# Patient Record
Sex: Female | Born: 1970 | Race: White | Hispanic: No | Marital: Married | Smoking: Never smoker
Health system: Southern US, Community
[De-identification: ages and names within clinical notes are randomized; demographics above are authoritative.]

## PROBLEM LIST (undated history)

## (undated) DIAGNOSIS — F32A Depression, unspecified: Secondary | ICD-10-CM

## (undated) DIAGNOSIS — R519 Headache, unspecified: Secondary | ICD-10-CM

## (undated) DIAGNOSIS — Z9889 Other specified postprocedural states: Secondary | ICD-10-CM

## (undated) DIAGNOSIS — R51 Headache: Secondary | ICD-10-CM

## (undated) DIAGNOSIS — F3160 Bipolar disorder, current episode mixed, unspecified: Secondary | ICD-10-CM

## (undated) DIAGNOSIS — J45909 Unspecified asthma, uncomplicated: Secondary | ICD-10-CM

## (undated) DIAGNOSIS — S83241A Other tear of medial meniscus, current injury, right knee, initial encounter: Secondary | ICD-10-CM

## (undated) DIAGNOSIS — R112 Nausea with vomiting, unspecified: Secondary | ICD-10-CM

## (undated) DIAGNOSIS — Z460 Encounter for fitting and adjustment of spectacles and contact lenses: Secondary | ICD-10-CM

## (undated) DIAGNOSIS — R5383 Other fatigue: Secondary | ICD-10-CM

## (undated) DIAGNOSIS — I10 Essential (primary) hypertension: Secondary | ICD-10-CM

## (undated) DIAGNOSIS — F900 Attention-deficit hyperactivity disorder, predominantly inattentive type: Secondary | ICD-10-CM

## (undated) DIAGNOSIS — F329 Major depressive disorder, single episode, unspecified: Secondary | ICD-10-CM

## (undated) DIAGNOSIS — K219 Gastro-esophageal reflux disease without esophagitis: Secondary | ICD-10-CM

## (undated) DIAGNOSIS — F419 Anxiety disorder, unspecified: Secondary | ICD-10-CM

## (undated) HISTORY — DX: Headache, unspecified: R51.9

## (undated) HISTORY — DX: Other fatigue: R53.83

## (undated) HISTORY — PX: TONSILLECTOMY: SUR1361

## (undated) HISTORY — DX: Headache: R51

## (undated) HISTORY — DX: Bipolar disorder, current episode mixed, unspecified: F31.60

## (undated) HISTORY — DX: Unspecified asthma, uncomplicated: J45.909

## (undated) HISTORY — DX: Attention-deficit hyperactivity disorder, predominantly inattentive type: F90.0

## (undated) HISTORY — DX: Anxiety disorder, unspecified: F41.9

## (undated) HISTORY — PX: DILATION AND CURETTAGE OF UTERUS: SHX78

---

## 1998-07-01 ENCOUNTER — Ambulatory Visit (HOSPITAL_COMMUNITY): Admission: RE | Admit: 1998-07-01 | Discharge: 1998-07-01 | Payer: Self-pay

## 1998-10-07 ENCOUNTER — Ambulatory Visit (HOSPITAL_COMMUNITY): Admission: RE | Admit: 1998-10-07 | Discharge: 1998-10-07 | Payer: Self-pay | Admitting: Internal Medicine

## 1998-10-07 ENCOUNTER — Encounter: Payer: Self-pay | Admitting: Internal Medicine

## 1999-09-08 ENCOUNTER — Other Ambulatory Visit: Admission: RE | Admit: 1999-09-08 | Discharge: 1999-09-08 | Payer: Self-pay | Admitting: Obstetrics and Gynecology

## 2000-03-15 ENCOUNTER — Inpatient Hospital Stay (HOSPITAL_COMMUNITY): Admission: AD | Admit: 2000-03-15 | Discharge: 2000-03-18 | Payer: Self-pay | Admitting: Obstetrics and Gynecology

## 2000-03-19 ENCOUNTER — Encounter: Admission: RE | Admit: 2000-03-19 | Discharge: 2000-05-31 | Payer: Self-pay | Admitting: Obstetrics and Gynecology

## 2000-04-27 ENCOUNTER — Other Ambulatory Visit: Admission: RE | Admit: 2000-04-27 | Discharge: 2000-04-27 | Payer: Self-pay | Admitting: Obstetrics and Gynecology

## 2001-06-05 ENCOUNTER — Observation Stay (HOSPITAL_COMMUNITY): Admission: EM | Admit: 2001-06-05 | Discharge: 2001-06-06 | Payer: Self-pay | Admitting: *Deleted

## 2001-08-29 ENCOUNTER — Other Ambulatory Visit: Admission: RE | Admit: 2001-08-29 | Discharge: 2001-08-29 | Payer: Self-pay | Admitting: Obstetrics and Gynecology

## 2002-12-31 ENCOUNTER — Other Ambulatory Visit: Admission: RE | Admit: 2002-12-31 | Discharge: 2002-12-31 | Payer: Self-pay | Admitting: Obstetrics and Gynecology

## 2003-05-23 ENCOUNTER — Ambulatory Visit (HOSPITAL_COMMUNITY): Admission: RE | Admit: 2003-05-23 | Discharge: 2003-05-23 | Payer: Self-pay | Admitting: Family Medicine

## 2003-06-12 ENCOUNTER — Ambulatory Visit (HOSPITAL_COMMUNITY): Admission: RE | Admit: 2003-06-12 | Discharge: 2003-06-12 | Payer: Self-pay | Admitting: Family Medicine

## 2003-10-07 ENCOUNTER — Ambulatory Visit (HOSPITAL_COMMUNITY): Admission: RE | Admit: 2003-10-07 | Discharge: 2003-10-07 | Payer: Self-pay | Admitting: Family Medicine

## 2004-01-30 ENCOUNTER — Inpatient Hospital Stay (HOSPITAL_COMMUNITY): Admission: AD | Admit: 2004-01-30 | Discharge: 2004-01-30 | Payer: Self-pay | Admitting: Obstetrics and Gynecology

## 2004-02-19 ENCOUNTER — Other Ambulatory Visit: Admission: RE | Admit: 2004-02-19 | Discharge: 2004-02-19 | Payer: Self-pay | Admitting: Obstetrics and Gynecology

## 2005-02-04 ENCOUNTER — Ambulatory Visit (HOSPITAL_COMMUNITY): Admission: RE | Admit: 2005-02-04 | Discharge: 2005-02-04 | Payer: Self-pay | Admitting: Family Medicine

## 2005-04-17 ENCOUNTER — Emergency Department (HOSPITAL_COMMUNITY): Admission: EM | Admit: 2005-04-17 | Discharge: 2005-04-17 | Payer: Self-pay | Admitting: Emergency Medicine

## 2005-05-07 ENCOUNTER — Other Ambulatory Visit: Admission: RE | Admit: 2005-05-07 | Discharge: 2005-05-07 | Payer: Self-pay | Admitting: Obstetrics and Gynecology

## 2006-07-19 HISTORY — PX: CHOLECYSTECTOMY: SHX55

## 2006-07-19 HISTORY — PX: ERCP: SHX60

## 2006-11-04 ENCOUNTER — Ambulatory Visit: Payer: Self-pay | Admitting: Internal Medicine

## 2006-11-11 ENCOUNTER — Ambulatory Visit: Payer: Self-pay | Admitting: Internal Medicine

## 2006-11-11 ENCOUNTER — Ambulatory Visit (HOSPITAL_COMMUNITY): Admission: RE | Admit: 2006-11-11 | Discharge: 2006-11-11 | Payer: Self-pay | Admitting: Internal Medicine

## 2006-11-18 ENCOUNTER — Ambulatory Visit (HOSPITAL_COMMUNITY): Admission: RE | Admit: 2006-11-18 | Discharge: 2006-11-18 | Payer: Self-pay | Admitting: Internal Medicine

## 2006-11-29 ENCOUNTER — Encounter (HOSPITAL_COMMUNITY): Admission: RE | Admit: 2006-11-29 | Discharge: 2006-12-29 | Payer: Self-pay | Admitting: Internal Medicine

## 2006-12-09 ENCOUNTER — Ambulatory Visit (HOSPITAL_COMMUNITY): Admission: RE | Admit: 2006-12-09 | Discharge: 2006-12-09 | Payer: Self-pay | Admitting: General Surgery

## 2006-12-09 ENCOUNTER — Encounter (INDEPENDENT_AMBULATORY_CARE_PROVIDER_SITE_OTHER): Payer: Self-pay | Admitting: General Surgery

## 2008-04-18 ENCOUNTER — Ambulatory Visit (HOSPITAL_COMMUNITY): Admission: RE | Admit: 2008-04-18 | Discharge: 2008-04-18 | Payer: Self-pay | Admitting: Internal Medicine

## 2008-07-22 ENCOUNTER — Emergency Department (HOSPITAL_COMMUNITY): Admission: EM | Admit: 2008-07-22 | Discharge: 2008-07-22 | Payer: Self-pay | Admitting: Emergency Medicine

## 2010-01-18 ENCOUNTER — Emergency Department (HOSPITAL_COMMUNITY): Admission: EM | Admit: 2010-01-18 | Discharge: 2010-01-19 | Payer: Self-pay | Admitting: Emergency Medicine

## 2010-07-19 HISTORY — PX: VAGINAL HYSTERECTOMY: SUR661

## 2010-08-10 ENCOUNTER — Encounter: Payer: Self-pay | Admitting: Family Medicine

## 2010-08-19 ENCOUNTER — Encounter (HOSPITAL_COMMUNITY)
Admission: RE | Admit: 2010-08-19 | Discharge: 2010-08-19 | Disposition: A | Discharge status: Home / Self Care | Payer: BC Managed Care – PPO | Source: Ambulatory Visit | Attending: Obstetrics and Gynecology | Admitting: Obstetrics and Gynecology

## 2010-08-19 DIAGNOSIS — Z01812 Encounter for preprocedural laboratory examination: Secondary | ICD-10-CM | POA: Insufficient documentation

## 2010-08-19 LAB — CBC
MCHC: 33.2 g/dL (ref 30.0–36.0)
Platelets: 394 10*3/uL (ref 150–400)
WBC: 6.4 10*3/uL (ref 4.0–10.5)

## 2010-08-25 ENCOUNTER — Other Ambulatory Visit: Payer: Self-pay | Admitting: Obstetrics and Gynecology

## 2010-08-25 ENCOUNTER — Ambulatory Visit (HOSPITAL_COMMUNITY)
Admission: RE | Admit: 2010-08-25 | Discharge: 2010-08-26 | Disposition: A | Discharge status: Home / Self Care | Payer: BC Managed Care – PPO | Source: Ambulatory Visit | Attending: Obstetrics and Gynecology | Admitting: Obstetrics and Gynecology

## 2010-08-25 DIAGNOSIS — R1031 Right lower quadrant pain: Secondary | ICD-10-CM | POA: Insufficient documentation

## 2010-08-25 DIAGNOSIS — N803 Endometriosis of pelvic peritoneum, unspecified: Secondary | ICD-10-CM | POA: Insufficient documentation

## 2010-08-26 LAB — CBC
HCT: 33.6 % — ABNORMAL LOW (ref 36.0–46.0)
Hemoglobin: 11.1 g/dL — ABNORMAL LOW (ref 12.0–15.0)
MCH: 29.3 pg (ref 26.0–34.0)
Platelets: 333 10*3/uL (ref 150–400)

## 2010-08-27 NOTE — H&P (Signed)
  NAMESURAIYA, [name] NO.:  [id number]  MEDICAL RECORD NO.:  [id number]         PATIENT TYPE:  WAMB  LOCATION:                                FACILITY:  [facility]  PHYSICIAN:  Guy Sandifer. Henderson Cloud, M.D. DATE OF BIRTH:  [birth year]  DATE OF ADMISSION:  08/25/2010 DATE OF DISCHARGE:                             HISTORY & PHYSICAL   CHIEF COMPLAINT:  Endometriosis.  HISTORY OF PRESENT ILLNESS:  This patient is a 40 year old married white female G2, P1 with endometriosis and abdominopelvic adhesive disease diagnosed in 1999 who has recurrent right lower quadrant pain that is becoming more severe.  At times it will double her over.  She has been on the birth control pill with no relief.  Ultrasound in my office on August 12, 2010 revealed the uterus measuring 7.1 x 3.4 x 5.0 cm.  The left ovary has a 10-mm follicle.  There is a question of hydrosalpinx on the left side and the right ovary appears normal.  After careful discussion of options, she is being admitted for laparoscopically- assisted vaginal hysterectomy and possible right salpingo-oophorectomy. Potential risks and complications have been discussed preoperatively.  PAST MEDICAL HISTORY: 1. Migraine headache. 2. Depression.  MEDICATIONS: 1. Yaz daily. 2. Nexium daily.  ALLERGIES:  SULFA.  PAST SURGICAL HISTORY: 1. Cholecystectomy. 2. Laparoscopy with hysteroscopy D and C.  OBSTETRICAL HISTORY:  Vaginal delivery x1.  FAMILY HISTORY:  Positive for UTI, arthritis, hypertension, cancer, and thyroid disease.  SOCIAL HISTORY:  Denies tobacco, alcohol, or drug abuse.  REVIEW OF SYSTEMS:  NEUROLOGIC:  Headache as above.  CARDIAC:  Denies chest pain.  PULMONARY:  Denies shortness of breath.  PHYSICAL EXAMINATION:  VITAL SIGNS:  Height 5 feet, 2-3/4 inches, weight 184 pounds, blood pressure 110/68. LUNGS:  Clear to auscultation. HEART:  Regular rate and rhythm. ABDOMEN:  Mild right lower quadrant tenderness  without rebound or masses. PELVIC:  Uterus is upper normal size, mobile, nontender.  Right adnexa mildly tender without palpable masses.  Left adnexa nontender without palpable masses. EXTREMITIES:  Grossly within normal limits. NEUROLOGIC:  Grossly within normal limits.  ASSESSMENT:  Endometriosis.  PLAN:  Laparoscopically-assisted vaginal hysterectomy, possible right salpingo-oophorectomy.     Guy Sandifer Henderson Cloud, M.D.     JET/MEDQ  D:  08/24/2010  T:  08/24/2010  Job:  [id number]  Electronically Signed by Harold Hedge M.D. on 08/27/2010 08:27:20 AM

## 2010-09-05 NOTE — Op Note (Signed)
NAMEPLEASANT, BRITZ                ACCOUNT NO.:  [id number]  MEDICAL RECORD NO.:  [id number]           PATIENT TYPE:  I  LOCATION:  9310                          FACILITY:  [facility]  PHYSICIAN:  Guy Sandifer. Henderson Cloud, M.D. DATE OF BIRTH:  04/18/1971  DATE OF PROCEDURE:  08/25/2010 DATE OF DISCHARGE:                              OPERATIVE REPORT   PREOPERATIVE DIAGNOSIS:  Endometriosis.  POSTOPERATIVE DIAGNOSES: 1. Endometriosis. 2. Adhesions.  PROCEDURE:  Laparoscopically-assisted vaginal hysterectomy with lysis of adhesions.  SURGEON:  Guy Sandifer. Henderson Cloud, MD  ASSISTANT:  Dineen Kid. Rana Snare, MD  ANESTHESIA:  General with endotracheal intubation, Quillian Quince, MD  SPECIMENS:  Uterus to Pathology.  ESTIMATED BLOOD LOSS:  400 mL.  INDICATIONS AND CONSENT:  The patient is a 40 year old married white female G2, P1 with known endometriosis and abdominopelvic adhesive disease who has increasing pain, especially in the right lower quadrant. Details are dictated in the history and physical.  Laparoscopically- assisted vaginal hysterectomy, possible right salpingo-oophorectomy is discussed with the patient preoperatively.  Potential risks and complications were discussed preoperatively including but not limited to infection, organ damage, bleeding requiring transfusion of blood products with HIV and hepatitis acquisition, DVT, PE, pneumonia, fistula formation, pelvic pain, laparotomy, painful intercourse.  All questions have been answered and consent is signed on the chart.  FINDINGS:  There are adhesions from the cecum to the right anterior abdominal wall.  Uterus is about 6 weeks in size.  Anterior and posterior cul-de-sacs were normal.  Pelvic sidewalls are normal and tubes and ovaries are normal.  PROCEDURE IN DETAILS:  It should be noted that the patient was a very difficult patient on which I started IV and consideration should be given if she goes back to the operating room for  placement of IV in radiology preoperatively.  The patient was taken to the operating room where she was identified, placed in dorsosupine position and general anesthesia was induced via endotracheal intubation.  She was then placed in dorsal lithotomy position.  Time-out was undertaken.  The patient had been marked in the right lower quadrant preoperatively.  She was prepped abdominally and vaginally.  Bladder straight catheterized.  Hulka tenaculum was placed in the uterus as a manipulator and she was draped in a sterile fashion.  The infraumbilical and suprapubic areas were injected in the midline with 0.5% plain Marcaine approximately 5 mL total.  Small infraumbilical incision was made.  Veress needle was placed and a good syringe and drop test were noted.  Insufflation failed to produce adequate flow from the insufflator.  Therefore, the Veress needle was removed.  It was then replaced twice with similar results each time having good results with a syringe and drop test.  At that point decision was made to proceed with open laparoscopy.  Dissection was carried in layers to the fascia which was incised under good visualization and blunt dissection was carried on down through the peritoneum.  Three and nine o'clock positions of the fascia were anchored with 0 Vicryl suture under good visualization and held for later use.  A disposable trocar sleeve was then placed.  The balloon was inflated.  I had to tie down with the anchoring sutures as well. Pneumoperitoneum was created and inspection revealed good placement. Careful inspection revealed no damage to the surrounding structures. Small suprapubic incision was made and a 5-mm Xcel bladeless disposable trocar sleeve was placed under direct visualization without difficulty. The above findings were noted.  The adhesions of the cecum to the anterior abdominal wall cut down easily with blunt dissection.  Then using the EnSeal bipolar cautery  cutting instrument, proximal ligaments were taken down bilaterally down the level of vesicouterine peritoneum. Vesicouterine peritoneum was taken down cephalad laterally as well. Suprapubic trocar sleeve was removed.  Instruments were removed and attention was turned to the vagina.  Posterior cul-de-sacs entered sharply and the cervix was circumscribed with unipolar cautery.  Mucosa was advanced sharply and bluntly.  Anterior cul-de-sac was entered without difficulty.  Then using the super jaws bipolar cautery cutting instrument, the uterosacral ligaments were taken down followed by the cardinal ligaments, the bladder pillars and the uterine vessels bilaterally.  Fundus was delivered posteriorly and the proximal ligaments were taken down and the specimen was delivered.  A single bleeder on the right side was controlled under good visualization with the right angle and was suture ligated with 0 Monocryl.  All sutures will be 0 Monocryl unless otherwise designated.  Uterosacral ligaments were plicated to the vaginal cuff bilaterally to separate sutures.  I then plicated the midline with a third suture.  Cuff was closed with figure-of-eights.  Foley catheter was placed in the bladder.  Clear urine was noted.  Attention was returned to the abdomen. Pneumoperitoneum was re-created.  Suprapubic trocar sleeve was reintroduced under good visualization.  Copious irrigation was carried out.  A single bleeder on the left side which appeared to be a small superior branch of the uterine artery was controlled with bipolar cautery.  Small peritoneal bleeders were also controlled on the vaginal cuff with bipolar cautery.  The EnSeal was used to control the superior branch of the left uterine artery.  Inspection under reduced pneumoperitoneum revealed good hemostasis.  Surgicel was back loaded through the laparoscope and placed over the side as well.  The remaining 25 mL of 0.5% plain Marcaine was also  instilled into the peritoneal cavity.  Instruments were removed.  Trocar sleeves were removed.  The umbilical incision was closed with the 0 Vicryl suture to completely close the fascia under good visualization.  The skin was closed on both incisions with interrupted 3-0 Vicryl suture.  All counts were correct. The patient was awakened and taken to the recovery room in stable condition.     Guy Sandifer Henderson Cloud, M.D.     JET/MEDQ  D:  08/25/2010  T:  08/26/2010  Job:  [id number]  Electronically Signed by Harold Hedge M.D. on 09/05/2010 12:09:38 PM

## 2010-09-05 NOTE — Discharge Summary (Signed)
  [name], [name]                ACCOUNT NO.:  [id number]  MEDICAL RECORD NO.:  [id number]           PATIENT TYPE:  I  LOCATION:  9310                          [facility]ACILITY:  [facility]  PHYSICIAN:  Guy Sandifer. Henderson Cloud, M.D. DATE OF BIRTH:  Nov 28, 1970  DATE OF ADMISSION:  08/25/2010 DATE OF DISCHARGE:  08/26/2010                              DISCHARGE SUMMARY   ADMITTING DIAGNOSIS:  Endometriosis.  DISCHARGE DIAGNOSES: 1. Endometriosis. 2. Adhesions.  PROCEDURE:  On August 25, 2010, laparoscopically-assisted vaginal hysterectomy and lysis of adhesions.  REASON FOR ADMISSION:  This patient is a 40 year old married white female G2, P1 with known endometriosis and abdominopelvic disease who has increasing pain.  Details are dictated in the history and physical. She was admitted for surgical management.  HOSPITAL COURSE:  The patient was admitted to the hospital, undergoes the above procedure.  Estimated blood loss is 400 mL.  It should be noted that she had difficulty in obtaining IV access preoperatively and she would benefit from placement of IV access in Radiology for future procedures.  On the evening of surgery, she has good pain relief.  She had some nausea with food.  She is ambulating.  Vital signs are stable. She is afebrile with clear urine output.  Abdomen is soft with diminished bowel sounds.  On the morning of discharge, she is feeling better.  Tolerating regular diet, ambulating, voiding with good pain relief.  She is not yet passed flatus at the time of examination.  Vital signs are stable.  She is afebrile.  Abdomen is soft with normal bowel sounds x4.  Hemoglobin is 11.1 and pathology is pending.  CONDITION ON DISCHARGE:  Good.  She will be discharged and passing flatus.  DIET:  Regular as tolerated.  ACTIVITY:  No lifting, no operation of automobiles, no vaginal entry.  She will call the office for problems including not limited to temperature 101 degrees,  persistent nausea, vomiting, heavy bleeding, or increasing pain.  MEDICATIONS: 1. Ibuprofen 600 mg q.6 hours for 2 days and q.6 hours p.r.n. 2. The patient has Percocet at home.  She was using for preoperative     pain relief. 3. Vicodin #20, 1-2 q.6 hours p.r.n. was also given at discharge.  Followup is in the office in 2 weeks.     Guy Sandifer Henderson Cloud, M.D.     JET/MEDQ  D:  08/26/2010  T:  08/26/2010  Job:  [id number]  Electronically Signed by Harold Hedge M.D. on 09/05/2010 12:09:32 PM

## 2010-09-17 ENCOUNTER — Ambulatory Visit (HOSPITAL_COMMUNITY)
Admission: RE | Admit: 2010-09-17 | Payer: BC Managed Care – PPO | Source: Ambulatory Visit | Admitting: Obstetrics and Gynecology

## 2010-10-04 LAB — DIFFERENTIAL
Basophils Relative: 1 % (ref 0–1)
Eosinophils Relative: 6 % — ABNORMAL HIGH (ref 0–5)
Lymphs Abs: 4.3 10*3/uL — ABNORMAL HIGH (ref 0.7–4.0)

## 2010-10-04 LAB — COMPREHENSIVE METABOLIC PANEL
Albumin: 3.6 g/dL (ref 3.5–5.2)
Creatinine, Ser: 0.55 mg/dL (ref 0.4–1.2)
GFR calc Af Amer: >60 mL/min (ref >60)
GFR calc non Af Amer: >60 mL/min (ref >60)
Potassium: 3.4 mEq/L — ABNORMAL LOW (ref 3.5–5.1)
Total Bilirubin: 0.2 mg/dL — ABNORMAL LOW (ref 0.3–1.2)
Total Protein: 6.5 g/dL (ref 6.0–8.3)

## 2010-10-04 LAB — URINALYSIS, ROUTINE W REFLEX MICROSCOPIC
Glucose, UA: NEGATIVE mg/dL
Leukocytes, UA: NEGATIVE
Nitrite: NEGATIVE
Urobilinogen, UA: 0.2 mg/dL (ref 0.0–1.0)
pH: 5.5 (ref 5.0–8.0)

## 2010-10-04 LAB — URINE MICROSCOPIC-ADD ON

## 2010-10-04 LAB — WET PREP, GENITAL: Trich, Wet Prep: NONE SEEN

## 2010-10-04 LAB — CBC
Hemoglobin: 13 g/dL (ref 12.0–15.0)
MCHC: 34.6 g/dL (ref 30.0–36.0)
MCV: 89.3 fL (ref 78.0–100.0)
Platelets: 357 10*3/uL (ref 150–400)
RDW: 13.2 % (ref 11.5–15.5)
WBC: 9.5 10*3/uL (ref 4.0–10.5)

## 2010-10-04 LAB — POCT PREGNANCY, URINE: Preg Test, Ur: NEGATIVE

## 2010-11-02 LAB — POCT CARDIAC MARKERS
Myoglobin, poc: 57.2 ng/mL (ref 12–200)
Troponin i, poc: <0.05 ng/mL (ref 0.00–0.09)

## 2010-11-02 LAB — CBC
HCT: 40.8 % (ref 36.0–46.0)
Hemoglobin: 14 g/dL (ref 12.0–15.0)
MCHC: 34.3 g/dL (ref 30.0–36.0)
MCV: 90.7 fL (ref 78.0–100.0)
RBC: 4.5 MIL/uL (ref 3.87–5.11)
RDW: 12.2 % (ref 11.5–15.5)

## 2010-11-02 LAB — URINALYSIS, ROUTINE W REFLEX MICROSCOPIC
Hgb urine dipstick: NEGATIVE
Ketones, ur: NEGATIVE mg/dL
Protein, ur: NEGATIVE mg/dL
pH: 6.5 (ref 5.0–8.0)

## 2010-11-02 LAB — COMPREHENSIVE METABOLIC PANEL
ALT: 22 U/L (ref 0–35)
Alkaline Phosphatase: 54 U/L (ref 39–117)
BUN: 5 mg/dL — ABNORMAL LOW (ref 6–23)
CO2: 26 mEq/L (ref 19–32)
Calcium: 9.4 mg/dL (ref 8.4–10.5)
GFR calc non Af Amer: >60 mL/min (ref >60)
Glucose, Bld: 125 mg/dL — ABNORMAL HIGH (ref 70–99)
Total Protein: 7 g/dL (ref 6.0–8.3)

## 2010-11-02 LAB — DIFFERENTIAL
Basophils Relative: 0 % (ref 0–1)
Eosinophils Absolute: 0 10*3/uL (ref 0.0–0.7)
Lymphs Abs: 0.5 10*3/uL — ABNORMAL LOW (ref 0.7–4.0)
Monocytes Relative: 1 % — ABNORMAL LOW (ref 3–12)
Neutro Abs: 8.5 10*3/uL — ABNORMAL HIGH (ref 1.7–7.7)
Neutrophils Relative %: 94 % — ABNORMAL HIGH (ref 43–77)

## 2010-11-02 LAB — LIPASE, BLOOD: Lipase: 30 U/L (ref 11–59)

## 2010-11-02 LAB — URINE MICROSCOPIC-ADD ON

## 2010-12-01 NOTE — Op Note (Signed)
[name], [name]                ACCOUNT NO.:  [id number]   MEDICAL RECORD NO.:  [id number]          PATIENT TYPE:  AMB   LOCATION:  DAY                           FACILITY:  [facility]   PHYSICIAN:  R. Roetta Sessions, M.D. DATE OF BIRTH:  1971-01-07   DATE OF PROCEDURE:  DATE OF DISCHARGE:                               OPERATIVE REPORT   Audio too short to transcribe (less than 5 seconds)      R. Roetta Sessions, M.D.     RMR/MEDQ  D:  11/11/2006  T:  11/11/2006  Job:  [id number]

## 2010-12-01 NOTE — Op Note (Signed)
[name], [name]                ACCOUNT NO.:  [id number]   MEDICAL RECORD NO.:  [id number]          PATIENT TYPE:  AMB   LOCATION:  DAY                           FACILITY:  [facility]   PHYSICIAN:  R. Roetta Sessions, M.D. DATE OF BIRTH:  1971-05-02   DATE OF PROCEDURE:  11/11/2006  DATE OF DISCHARGE:                               OPERATIVE REPORT   PROCEDURE:  Diagnostic esophagogastroduodenoscopy.   INDICATIONS FOR PROCEDURE:  The patient is a 40 year old lady with  intermittent right upper quadrant abdominal pain, some boring epigastric  pain, and some typical reflux symptoms only intermittently relieved with  Nexium.  EGD is now being done.  This approach has been discussed with  the patient at length.  Potential risks, benefits and alternatives have  been reviewed, questions answered.  She is agreeable.  Please see  documentation in the medical record.   PROCEDURE NOTE:  O2 saturation, blood pressure, pulse and respirations  were monitored throughout the entire procedure.  Conscious sedation:  Versed 5 mg IV, fentanyl 125 mcg IV in divided doses. Phenergan 12.5 mg  diluted slow IV push.  Cetacaine spray for topical pharyngeal  anesthesia.  Instrument:  Pentax video chip system.   FINDINGS:  Examination of the tubular esophagus revealed no mucosal  abnormalities.  EG junction was easily traversed.   Stomach:  The gastric cavity was emptied and insufflated well with air.  A thorough examination of the gastric mucosa including a retroflexed  view of the proximal stomach and esophagogastric junction demonstrated a  small hiatal hernia and a patulous EG junction, otherwise gastric mucosa  appeared normal.  Pylorus patent and easily traversed.  Examination of  the bulb and second portion revealed no abnormalities.   therapeutic/diagnostic maneuvers performed:  None.   The patient tolerated the procedure well and was reacted in endoscopy.   IMPRESSION:  1. Normal esophagus.  2.  Patulous esophagogastric junction, hiatal hernia.  3. Otherwise normal stomach, patent pylorus, normal D1, D2.   RECOMMENDATIONS:  1. Nexium begin and AcipHex 20 mg orally daily.  The patient is to go      by my office for free samples.  2. I suspect the patient does have an element of reflux only      intermittently treated.  The postprandial right upper quadrant      abdominal pain continues to make me suspicious about gallbladder      disease.  She had a negative      gallbladder ultrasound 3 years ago.  Additional recommendations      will include proceeding with a gallbladder ultrasound next week.  I      told Ms. Bartko that we may end up getting a HIDA with fatty meal      challenge in the near future as well.      Jonathon Bellows, M.D.  Electronically Signed     RMR/MEDQ  D:  11/11/2006  T:  11/11/2006  Job:  [id number]   cc:   Corrie Mckusick, M.D.  Fax: [phone]

## 2010-12-01 NOTE — Op Note (Signed)
[name], [name]                ACCOUNT NO.:  [id number]   MEDICAL RECORD NO.:  [id number]          PATIENT TYPE:  AMB   LOCATION:  DAY                           FACILITY:  [facility]   PHYSICIAN:  Dalia Heading, M.D.  DATE OF BIRTH:  [birth year]   DATE OF PROCEDURE:  12/09/2006  DATE OF DISCHARGE:                               OPERATIVE REPORT   AGE:  40 years old.   PREOPERATIVE DIAGNOSIS:  Chronic cholecystitis.   POSTOPERATIVE DIAGNOSIS:  Chronic cholecystitis.   PROCEDURE:  Laparoscopic cholecystectomy.   SURGEON:  Dr. Franky Macho.   ANESTHESIA:  General endotracheal.   INDICATIONS:  The patient is a 40 year old white female who presents  with chronic cholecystitis.  Risks and benefits of the procedure  including bleeding, infection, hepatobiliary injury and the possibility  of an open procedure were fully explained to the patient, gave informed  consent.   PROCEDURE NOTE:  The patient was placed in the supine position.  After  induction of general endotracheal anesthesia, the abdomen was prepped  and draped in the usual sterile technique with Betadine.  Surgical site  confirmation was performed.   An infraumbilical incision was made down to the fascia.  A Veress needle  was introduced into the abdominal cavity and confirmation of placement  was done using the saline drop test.  The abdomen was then insufflated  to 16 mmHg pressure.  An 11 mm trocar was introduced into the abdominal  cavity under direct visualization without difficulty.  The patient was  placed in reversed Trendelenburg position.  An additional 11 mm trocar  was placed in the epigastric region and 5 mm trocars were placed in the  right upper quadrant right flank regions.  Liver was inspected and found  to be within normal limits.  The gallbladder was retracted superior and  laterally.  Dissection was begun around the infundibulum of the  gallbladder.  The cystic duct was first identified.  Its juncture  to the  infundibulum fully identified.  Endoclips placed proximally and distally  on the cystic duct and the cystic duct was divided.  This was likewise  done to the cystic artery.  The gallbladder was then freed away from the  gallbladder fossa using Bovie electrocautery.  The gallbladder was  delivered through the epigastric trocar site using EndoCatch bag.  The  gallbladder fossa was inspected.  No abnormal bleeding or bile leakage  was noted.  Surgicel was placed in the gallbladder fossa.  All fluid and  air were then evacuated from the abdominal cavity prior to removal of  the trocars.   All wounds were irrigated with normal saline.  All wounds were injected  with 0.5% Sensorcaine.  The infraumbilical fascia was reapproximated  using a 0 Vicryl interrupted suture.  All skin incisions were closed  using staples.  Betadine ointment and dry sterile dressings were  applied.   All tape and needle counts correct at the end of the procedure.  The  patient was extubated in the operating room and went back to the  recovery room awake and in stable  condition.   COMPLICATIONS:  None.   SPECIMEN:  Gallbladder.   BLOOD LOSS:  Minimal.      Dalia Heading, M.D.  Electronically Signed     MAJ/MEDQ  D:  12/09/2006  T:  12/09/2006  Job:  [id number]   cc:   Charlaine Dalton. Sherene Sires, MD, FCCP  [address]  [place] [address]   Corrie Mckusick, M.D.  Fax: [phone]

## 2010-12-04 NOTE — H&P (Signed)
[facility]  Patient:    [name], [name] Visit Number: [id number] MRN: [id number]          Service Type: MED Location: 3A F621 [id number] Attending Physician:  Ilean Skill Dictated by:   Carylon Perches, M.D. Admit Date:  06/05/2001                           History and Physical  CHIEF COMPLAINT:  Vomiting.  HISTORY OF THE PRESENT ILLNESS:  This patient is a 40 year old white female who presented to the emergency room at 1:30 a.m. with a four-hour history of nausea, vomiting and diarrhea with associated crampy abdominal pain, body aches and chills.  She has been treated in the emergency room thus far with three doses of Phenergan, IV Zofran, IV fluids and IV Reglan.  She had repeated heaving and is being hospitalized for observation.  There had been no hematemesis or rectal bleeding.  Her headache, body aches and chills had actually improved through her ER course.  PAST MEDICAL HISTORY: 1. Tonsillectomy. 2. LASIK eye surgery.  MEDICATIONS:  None.  ALLERGIES:  SULFA.  SOCIAL HISTORY:  She does not smoke cigarettes or abuse alcohol.  REVIEW OF SYSTEMS:  Noncontributory.  PHYSICAL EXAMINATION:  VITAL SIGNS:  Temperature 97.9, pulse 133, respirations 20, blood pressure 133/75.  GENERAL:  A weak, ill-appearing young white female.  HEENT:  No scleral icterus, the sclerae are injected.  Oropharynx is unremarkable.  NECK:  Supple with no thyromegaly.  LUNGS:  Clear.  HEART:  Tachycardic with no murmurs.  ABDOMEN:  Nontender with no hepatosplenomegaly.  EXTREMITIES:  Normal pulses; no cyanosis, clubbing or edema.  NEUROLOGIC:  Grossly intact.  LABORATORY DATA:  White count 14.1, hematocrit 41.7.  Sodium 136, potassium 3.8, bicarb 36, glucose 140, BUN 9, creatinine 0.7, albumin 4.2, AST 28, amylase 78.  Serum pregnancy test negative.  IMPRESSION:  Viral gastroenteritis.  PLAN: 1. Continue IV fluids. 2. Continue p.r.n. IV Zofran. Dictated  by:   Carylon Perches, M.D. Attending Physician:  Ilean Skill DD:  06/05/01 TD:  06/05/01 Job: [id number]

## 2010-12-04 NOTE — H&P (Signed)
[name], [name]                ACCOUNT NO.:  [id number]   MEDICAL RECORD NO.:  [id number]           PATIENT TYPE:   LOCATION:  DAY                            FACILITY:   PHYSICIAN:  R. Roetta Sessions, M.D. DATE OF BIRTH:  [birth year]   DATE OF ADMISSION:  DATE OF DISCHARGE:  LH                              HISTORY & PHYSICAL   REASON FOR CONSULTATION:  Recent episode of nausea, vomiting, bloating,  diarrhea, diaphoresis, reflux.   [name] is a pleasant 40 year old Caucasian female, a  Psychologist, educational, sent over at the courtesy of Dr.  Dorthey Sawyer to further evaluate the above-mentioned symptoms.  She has  had some typical reflux symptoms she describes as heartburn going back 3  years, diagnosed originally by barium swallow.  She has been on Nexium  off and on for this time, during this time, and even though she takes it  daily she continues to have some boring lower retrosternal pain after  eating some salad.  On two different occasions 3 weeks ago she developed  rather acute onset of nausea, some vomiting, bloating and non-bloody  diarrhea.  She denies having a fever.  There have been some other folks  around with similar symptoms.  Those acute symptoms have subsided.  She  is back to her relatively constipated baseline, having 2-3 bowel  movements weekly, no melena or rectal bleeding.  She does not have any  odynophagia or dysphagia.  She has never had an upper endoscopy.  She  reportedly had a gallbladder ultrasound 3 years ago which was negative.   PAST MEDICAL HISTORY:  Significant for:  1. Reflux.  2. Report hiatal hernia on barium esophagram.   PAST SURGERIES:  Tonsillectomy and laparoscopic GYN surgery.   CURRENT MEDICATIONS:  1. Nexium 40 mg daily.  2. Allegra p.r.n.  3. Singulair p.r.n.  4. BCP daily.   ALLERGIES:  1. SULFA (HIVES).  2. TAMIFLU (HIVES AND RASH).   FAMILY HISTORY:  1. Mother has a hiatal hernia,  gastroesophageal reflux disease.  2. Father is healthy.  3. No history of chronic GI or liver disease otherwise.   SOCIAL HISTORY:  1. The patient is married and has one son.  2. Special education teacher at [employer]  3. No tobacco.  4. No alcohol.  5. No illicit drugs.   REVIEW OF SYSTEMS:  No chest pain, dyspnea on exertion.   PHYSICAL EXAMINATION:  Weight 148, temp 98.2, BP 100/60, pulse 72.  Skin  warm and dry, no jaundice, no continuous stigmata of chronic liver  disease.  HEENT EXAM:  No scleral icterus.  CHEST:  Lungs are clear to auscultation.  CARDIAC EXAM:  Regular rate and rhythm without murmur, gallop, rub.  BREAST EXAM:  Deferred.  ABDOMEN:  Nondistended, positive bowel sounds, soft, nontender, without  appreciable mass or organomegaly.  EXTREMITIES:  No edema.   IMPRESSION:  [name] is a pleasant 40 year old lady with a  recent self-limiting acute illness characterized by diaphoresis, nausea,  vomiting, abdominal bloating and non-bloody diarrhea.  I suspect most  likely  she contracted a viral gastroenteritis which was self-limiting.  However, I am struck by the fact that she occasionally has some  epigastric pain radiating into her back and she describes typical  heartburn symptoms not always relieved with regular Nexium therapy.  I  did not mention above that she has lost about 25 pounds in the past one  year, cutting back on calories, and exercising, in attempt to develop a  healthy body mass index.  She does admit that her reflux symptoms  overall have improved but continues to have multiple episodes daily  since she has lost the weight.  Occult gallbladder disease would remain  in the differential as a cause of the above-mentioned symptoms at this  time.   RECOMMENDATIONS:  I told [name] it would be in her best interest to  go ahead and have a diagnostic EGD to rule out complicated  gastroesophageal reflux disease.  Depending on the  results of that  study, would consider a repeat gallbladder ultrasound.  The potential  risks, benefits and alternatives of this approach have been reviewed,  her questions answered, she is agreeable.   I have also asked her to start taking some Benefiber, one tablespoon  daily, to facilitate bowel function and hopefully this will alleviate  some of her constipation symptoms.   I would like to thank Dr. Dorthey Sawyer for allowing me to see this nice  lady today.      Jonathon Bellows, M.D.  Electronically Signed     RMR/MEDQ  D:  11/04/2006  T:  11/05/2006  Job:  [id number]   cc:   Corrie Mckusick, M.D.  Fax: [phone]

## 2013-12-17 ENCOUNTER — Other Ambulatory Visit: Payer: Self-pay | Admitting: Orthopedic Surgery

## 2013-12-17 ENCOUNTER — Encounter (HOSPITAL_BASED_OUTPATIENT_CLINIC_OR_DEPARTMENT_OTHER): Payer: Self-pay | Admitting: *Deleted

## 2013-12-17 NOTE — Progress Notes (Signed)
12/17/13 1038  OBSTRUCTIVE SLEEP APNEA  Have you ever been diagnosed with sleep apnea through a sleep study? No  Do you snore loudly (loud enough to be heard through closed doors)?  1  Do you often feel tired, fatigued, or sleepy during the daytime? 0  Has anyone observed you stop breathing during your sleep? 0  Do you have, or are you being treated for high blood pressure? 1  BMI more than 35 kg/m2? 1  Age over 41 years old? 0  Neck circumference greater than 40 cm/16 inches? 1  Gender: 0  Obstructive Sleep Apnea Score 4  Score 4 or greater  Results sent to PCP

## 2013-12-17 NOTE — Progress Notes (Signed)
Will go to [facility] for [id number] 

## 2013-12-18 ENCOUNTER — Encounter (HOSPITAL_COMMUNITY)
Admission: RE | Admit: 2013-12-18 | Discharge: 2013-12-18 | Disposition: A | Discharge status: Home / Self Care | Payer: Worker's Compensation | Source: Ambulatory Visit | Attending: Orthopedic Surgery | Admitting: Orthopedic Surgery

## 2013-12-18 DIAGNOSIS — Z01810 Encounter for preprocedural cardiovascular examination: Secondary | ICD-10-CM | POA: Insufficient documentation

## 2013-12-18 DIAGNOSIS — Z01812 Encounter for preprocedural laboratory examination: Secondary | ICD-10-CM | POA: Insufficient documentation

## 2013-12-18 LAB — BASIC METABOLIC PANEL
BUN: 12 mg/dL (ref 6–23)
CO2: 26 mEq/L (ref 19–32)
CREATININE: 0.66 mg/dL (ref 0.50–1.10)
Calcium: 9.3 mg/dL (ref 8.4–10.5)
Chloride: 100 mEq/L (ref 96–112)
Glucose, Bld: 115 mg/dL — ABNORMAL HIGH (ref 70–99)
POTASSIUM: 4.3 meq/L (ref 3.7–5.3)
Sodium: 138 mEq/L (ref 137–147)

## 2013-12-21 ENCOUNTER — Encounter (HOSPITAL_BASED_OUTPATIENT_CLINIC_OR_DEPARTMENT_OTHER): Payer: Worker's Compensation | Admitting: Certified Registered"

## 2013-12-21 ENCOUNTER — Encounter (HOSPITAL_BASED_OUTPATIENT_CLINIC_OR_DEPARTMENT_OTHER): Payer: Self-pay | Admitting: Certified Registered"

## 2013-12-21 ENCOUNTER — Encounter (HOSPITAL_BASED_OUTPATIENT_CLINIC_OR_DEPARTMENT_OTHER)
Admission: RE | Disposition: A | Discharge status: Home / Self Care | Payer: Self-pay | Source: Ambulatory Visit | Attending: Orthopedic Surgery

## 2013-12-21 ENCOUNTER — Ambulatory Visit (HOSPITAL_BASED_OUTPATIENT_CLINIC_OR_DEPARTMENT_OTHER)
Admission: RE | Admit: 2013-12-21 | Discharge: 2013-12-21 | Disposition: A | Discharge status: Home / Self Care | Payer: Worker's Compensation | Source: Ambulatory Visit | Attending: Orthopedic Surgery | Admitting: Orthopedic Surgery

## 2013-12-21 ENCOUNTER — Ambulatory Visit (HOSPITAL_BASED_OUTPATIENT_CLINIC_OR_DEPARTMENT_OTHER): Payer: Worker's Compensation | Admitting: Certified Registered"

## 2013-12-21 DIAGNOSIS — Z885 Allergy status to narcotic agent status: Secondary | ICD-10-CM | POA: Insufficient documentation

## 2013-12-21 DIAGNOSIS — I10 Essential (primary) hypertension: Secondary | ICD-10-CM | POA: Insufficient documentation

## 2013-12-21 DIAGNOSIS — X58XXXA Exposure to other specified factors, initial encounter: Secondary | ICD-10-CM | POA: Insufficient documentation

## 2013-12-21 DIAGNOSIS — Z882 Allergy status to sulfonamides status: Secondary | ICD-10-CM | POA: Insufficient documentation

## 2013-12-21 DIAGNOSIS — Z6841 Body Mass Index (BMI) 40.0 and over, adult: Secondary | ICD-10-CM | POA: Insufficient documentation

## 2013-12-21 DIAGNOSIS — Y929 Unspecified place or not applicable: Secondary | ICD-10-CM | POA: Insufficient documentation

## 2013-12-21 DIAGNOSIS — K219 Gastro-esophageal reflux disease without esophagitis: Secondary | ICD-10-CM | POA: Insufficient documentation

## 2013-12-21 DIAGNOSIS — F3289 Other specified depressive episodes: Secondary | ICD-10-CM | POA: Insufficient documentation

## 2013-12-21 DIAGNOSIS — S83242A Other tear of medial meniscus, current injury, left knee, initial encounter: Secondary | ICD-10-CM | POA: Diagnosis present

## 2013-12-21 DIAGNOSIS — Z888 Allergy status to other drugs, medicaments and biological substances status: Secondary | ICD-10-CM | POA: Insufficient documentation

## 2013-12-21 DIAGNOSIS — S83241A Other tear of medial meniscus, current injury, right knee, initial encounter: Secondary | ICD-10-CM

## 2013-12-21 DIAGNOSIS — F329 Major depressive disorder, single episode, unspecified: Secondary | ICD-10-CM | POA: Insufficient documentation

## 2013-12-21 DIAGNOSIS — IMO0002 Reserved for concepts with insufficient information to code with codable children: Secondary | ICD-10-CM | POA: Insufficient documentation

## 2013-12-21 DIAGNOSIS — Z79899 Other long term (current) drug therapy: Secondary | ICD-10-CM | POA: Insufficient documentation

## 2013-12-21 HISTORY — DX: Major depressive disorder, single episode, unspecified: F32.9

## 2013-12-21 HISTORY — DX: Gastro-esophageal reflux disease without esophagitis: K21.9

## 2013-12-21 HISTORY — DX: Essential (primary) hypertension: I10

## 2013-12-21 HISTORY — DX: Other tear of medial meniscus, current injury, right knee, initial encounter: S83.241A

## 2013-12-21 HISTORY — DX: Other specified postprocedural states: Z98.890

## 2013-12-21 HISTORY — DX: Depression, unspecified: F32.A

## 2013-12-21 HISTORY — PX: KNEE ARTHROSCOPY WITH MEDIAL MENISECTOMY: SHX5651

## 2013-12-21 HISTORY — DX: Other specified postprocedural states: R11.2

## 2013-12-21 HISTORY — DX: Encounter for fitting and adjustment of spectacles and contact lenses: Z46.0

## 2013-12-21 SURGERY — ARTHROSCOPY, KNEE, WITH MEDIAL MENISCECTOMY
Anesthesia: General | Site: Knee | Laterality: Left

## 2013-12-21 MED ORDER — HYDROCODONE-ACETAMINOPHEN 5-325 MG PO TABS
1.0000 | ORAL_TABLET | Freq: Once | ORAL | Status: AC
  Administered 2013-12-21: 1 via ORAL

## 2013-12-21 MED ORDER — KETOROLAC TROMETHAMINE 10 MG PO TABS: 10.0000 mg | ORAL_TABLET | Freq: Four times a day (QID) | ORAL | Status: DC | PRN

## 2013-12-21 MED ORDER — ONDANSETRON HCL 4 MG/2ML IJ SOLN
INTRAMUSCULAR | Status: DC | PRN
  Administered 2013-12-21: 4 mg via INTRAVENOUS

## 2013-12-21 MED ORDER — BUPIVACAINE-EPINEPHRINE 0.5% -1:200000 IJ SOLN
INTRAMUSCULAR | Status: DC | PRN
  Administered 2013-12-21: 20 mL

## 2013-12-21 MED ORDER — DEXAMETHASONE SODIUM PHOSPHATE 4 MG/ML IJ SOLN
INTRAMUSCULAR | Status: DC | PRN
  Administered 2013-12-21: 8 mg via INTRAVENOUS

## 2013-12-21 MED ORDER — PROPOFOL 10 MG/ML IV BOLUS
INTRAVENOUS | Status: DC | PRN
  Administered 2013-12-21: 180 mg via INTRAVENOUS

## 2013-12-21 MED ORDER — BUPIVACAINE HCL (PF) 0.5 % IJ SOLN
INTRAMUSCULAR | Status: AC
  Filled 2013-12-21: qty 30

## 2013-12-21 MED ORDER — CEFAZOLIN SODIUM-DEXTROSE 2-3 GM-% IV SOLR
2.0000 g | INTRAVENOUS | Status: AC
  Administered 2013-12-21: 2 g via INTRAVENOUS

## 2013-12-21 MED ORDER — PROMETHAZINE HCL 25 MG PO TABS: 25.0000 mg | ORAL_TABLET | Freq: Four times a day (QID) | ORAL | Status: DC | PRN

## 2013-12-21 MED ORDER — PROMETHAZINE HCL 25 MG/ML IJ SOLN: 6.2500 mg | INTRAMUSCULAR | Status: DC | PRN

## 2013-12-21 MED ORDER — SCOPOLAMINE 1 MG/3DAYS TD PT72
MEDICATED_PATCH | TRANSDERMAL | Status: AC
  Filled 2013-12-21: qty 1

## 2013-12-21 MED ORDER — MIDAZOLAM HCL 2 MG/2ML IJ SOLN
INTRAMUSCULAR | Status: AC
  Filled 2013-12-21: qty 2

## 2013-12-21 MED ORDER — KETOROLAC TROMETHAMINE 30 MG/ML IJ SOLN
INTRAMUSCULAR | Status: AC
  Filled 2013-12-21: qty 1

## 2013-12-21 MED ORDER — SODIUM CHLORIDE 0.9 % IR SOLN
Status: DC | PRN
  Administered 2013-12-21: 6000 mL

## 2013-12-21 MED ORDER — LIDOCAINE HCL (CARDIAC) 20 MG/ML IV SOLN
INTRAVENOUS | Status: DC | PRN
  Administered 2013-12-21: 80 mg via INTRAVENOUS

## 2013-12-21 MED ORDER — HYDROCODONE-ACETAMINOPHEN 10-325 MG PO TABS: 1.0000 | ORAL_TABLET | Freq: Four times a day (QID) | ORAL | Status: DC | PRN

## 2013-12-21 MED ORDER — FENTANYL CITRATE 0.05 MG/ML IJ SOLN
INTRAMUSCULAR | Status: DC | PRN
  Administered 2013-12-21 (×2): 50 ug via INTRAVENOUS
  Administered 2013-12-21: 100 ug via INTRAVENOUS

## 2013-12-21 MED ORDER — MIDAZOLAM HCL 5 MG/5ML IJ SOLN
INTRAMUSCULAR | Status: DC | PRN
  Administered 2013-12-21: 2 mg via INTRAVENOUS

## 2013-12-21 MED ORDER — MIDAZOLAM HCL 2 MG/2ML IJ SOLN: 1.0000 mg | INTRAMUSCULAR | Status: DC | PRN

## 2013-12-21 MED ORDER — HYDROCODONE-ACETAMINOPHEN 5-325 MG PO TABS
ORAL_TABLET | ORAL | Status: AC
  Filled 2013-12-21: qty 1

## 2013-12-21 MED ORDER — CEFAZOLIN SODIUM-DEXTROSE 2-3 GM-% IV SOLR
INTRAVENOUS | Status: AC
  Filled 2013-12-21: qty 50

## 2013-12-21 MED ORDER — FENTANYL CITRATE 0.05 MG/ML IJ SOLN
INTRAMUSCULAR | Status: AC
  Filled 2013-12-21: qty 6

## 2013-12-21 MED ORDER — LACTATED RINGERS IV SOLN
INTRAVENOUS | Status: DC
  Administered 2013-12-21 (×2): via INTRAVENOUS

## 2013-12-21 MED ORDER — FENTANYL CITRATE 0.05 MG/ML IJ SOLN: 50.0000 ug | INTRAMUSCULAR | Status: DC | PRN

## 2013-12-21 MED ORDER — HYDROMORPHONE HCL PF 1 MG/ML IJ SOLN: 0.2500 mg | INTRAMUSCULAR | Status: DC | PRN

## 2013-12-21 MED ORDER — SCOPOLAMINE 1 MG/3DAYS TD PT72
1.0000 | MEDICATED_PATCH | TRANSDERMAL | Status: DC
  Administered 2013-12-21: 1.5 mg via TRANSDERMAL

## 2013-12-21 MED ORDER — KETOROLAC TROMETHAMINE 30 MG/ML IJ SOLN
30.0000 mg | Freq: Once | INTRAMUSCULAR | Status: AC
  Administered 2013-12-21: 30 mg via INTRAMUSCULAR

## 2013-12-21 SURGICAL SUPPLY — 44 items
APL SKNCLS STERI-STRIP NONHPOA (GAUZE/BANDAGES/DRESSINGS) ×1
BANDAGE ELASTIC 6 VELCRO ST LF (GAUZE/BANDAGES/DRESSINGS) ×3 IMPLANT
BANDAGE ESMARK 6X9 LF (GAUZE/BANDAGES/DRESSINGS) IMPLANT
BENZOIN TINCTURE PRP APPL 2/3 (GAUZE/BANDAGES/DRESSINGS) ×3 IMPLANT
BLADE CUTTER GATOR 3.5 (BLADE) ×3 IMPLANT
BNDG CMPR 9X6 STRL LF SNTH (GAUZE/BANDAGES/DRESSINGS)
BNDG ESMARK 6X9 LF (GAUZE/BANDAGES/DRESSINGS)
CANISTER SUCT 3000ML (MISCELLANEOUS) IMPLANT
CLOSURE WOUND 1/2 X4 (GAUZE/BANDAGES/DRESSINGS) ×1
CUFF TOURNIQUET SINGLE 34IN LL (TOURNIQUET CUFF) IMPLANT
CUTTER KNOT PUSHER 2-0 FIBERWI (INSTRUMENTS) IMPLANT
CUTTER MENISCUS  4.2MM (BLADE)
CUTTER MENISCUS 4.2MM (BLADE) IMPLANT
DRAPE ARTHROSCOPY W/POUCH 90 (DRAPES) ×3 IMPLANT
DRAPE U 20/CS (DRAPES) ×1 IMPLANT
DURAPREP 26ML APPLICATOR (WOUND CARE) ×3 IMPLANT
ELECT MENISCUS 165MM 90D (ELECTRODE) IMPLANT
ELECT REM PT RETURN 9FT ADLT (ELECTROSURGICAL)
ELECTRODE REM PT RTRN 9FT ADLT (ELECTROSURGICAL) IMPLANT
GAUZE SPONGE 4X4 12PLY STRL (GAUZE/BANDAGES/DRESSINGS) ×3 IMPLANT
GLOVE BIO SURGEON STRL SZ8 (GLOVE) ×3 IMPLANT
GLOVE BIOGEL M STRL SZ7.5 (GLOVE) ×2 IMPLANT
GLOVE BIOGEL PI IND STRL 8 (GLOVE) ×2 IMPLANT
GLOVE BIOGEL PI INDICATOR 8 (GLOVE) ×6
GLOVE ORTHO TXT STRL SZ7.5 (GLOVE) ×3 IMPLANT
GOWN STRL REUS W/ TWL LRG LVL3 (GOWN DISPOSABLE) ×1 IMPLANT
GOWN STRL REUS W/ TWL XL LVL3 (GOWN DISPOSABLE) ×2 IMPLANT
GOWN STRL REUS W/TWL LRG LVL3 (GOWN DISPOSABLE)
GOWN STRL REUS W/TWL XL LVL3 (GOWN DISPOSABLE) ×9
IV NS IRRIG 3000ML ARTHROMATIC (IV SOLUTION) ×6 IMPLANT
KNEE WRAP E Z 3 GEL PACK (MISCELLANEOUS) ×3 IMPLANT
MANIFOLD NEPTUNE II (INSTRUMENTS) ×3 IMPLANT
PACK ARTHROSCOPY DSU (CUSTOM PROCEDURE TRAY) ×3 IMPLANT
PACK BASIN DAY SURGERY FS (CUSTOM PROCEDURE TRAY) ×3 IMPLANT
PENCIL BUTTON HOLSTER BLD 10FT (ELECTRODE) IMPLANT
SET ARTHROSCOPY TUBING (MISCELLANEOUS) ×3
SET ARTHROSCOPY TUBING LN (MISCELLANEOUS) ×1 IMPLANT
SLEEVE SCD COMPRESS KNEE MED (MISCELLANEOUS) ×2 IMPLANT
STRIP CLOSURE SKIN 1/2X4 (GAUZE/BANDAGES/DRESSINGS) ×2 IMPLANT
SUT MNCRL AB 4-0 PS2 18 (SUTURE) ×3 IMPLANT
TOWEL OR 17X24 6PK STRL BLUE (TOWEL DISPOSABLE) ×3 IMPLANT
TOWEL OR NON WOVEN STRL DISP B (DISPOSABLE) ×1 IMPLANT
WAND STAR VAC 90 (SURGICAL WAND) IMPLANT
WATER STERILE IRR 1000ML POUR (IV SOLUTION) ×3 IMPLANT

## 2013-12-21 NOTE — Op Note (Signed)
12/21/2013  10:46 AM  PATIENT:  [name]    PRE-OPERATIVE DIAGNOSIS:  LEFT KNEE TEAR MENISCUS MEDIAL KNEE  POST-OPERATIVE DIAGNOSIS:  Same  PROCEDURE:  LEFT KNEE ARTHROSCOPY WITH MEDIAL MENISECTOMY  SURGEON:  Eulas Post, MD  PHYSICIAN ASSISTANT: Janace Litten, OPA-C, present and scrubbed throughout the case, critical for completion in a timely fashion, and for retraction, instrumentation, and closure.  ANESTHESIA:   General  PREOPERATIVE INDICATIONS:  [name] is a  43 y.o. female with a diagnosis of LEFT KNEE TEAR MENISCUS MEDIAL KNEE who failed conservative measures and elected for surgical management.    The risks benefits and alternatives were discussed with the patient preoperatively including but not limited to the risks of infection, bleeding, nerve injury, cardiopulmonary complications, the need for revision surgery, among others, and the patient was willing to proceed.  OPERATIVE IMPLANTS: None  OPERATIVE FINDINGS: Small tear centrally of the posterior horn medial meniscus. The root was stable. The articular surface throughout the knee was intact, small area of grade 2 changes on the medial femoral condyle. The patellofemoral joint and anterior cruciate ligament were intact. PCL was intact.  OPERATIVE PROCEDURE: The patient was brought to the operating room and placed in supine position. General anesthesia was administered. IV antibiotics were given. The left lower extremity was prepped and draped in usual sterile fashion. Time out was performed. Diagnostic arthroscopy was carried out through an anterior inferior portals. The shaver and the arthroscopic biter was used to debride the small posterior horn medial meniscus tear back to a stable configuration. I completed the diagnostic arthroscopy, removed the instruments, closed the portals with Monocryl, and injected the knee with Marcaine. She tolerated this well and Steri-Strips were placed followed by sterile gauze  and she was awakened and returned back in stable and satisfactory condition.

## 2013-12-21 NOTE — Transfer of Care (Signed)
Immediate Anesthesia Transfer of Care Note  Patient: [name]  Procedure(s) Performed: Procedure(s): LEFT KNEE ARTHROSCOPY WITH MEDIAL MENISECTOMY (Left)  Patient Location: PACU  Anesthesia Type:General  Level of Consciousness: awake, alert  and oriented  Airway & Oxygen Therapy: Patient Spontanous Breathing and Patient connected to face mask oxygen  Post-op Assessment: Report given to PACU RN, Post -op Vital signs reviewed and stable and Patient moving all extremities  Post vital signs: Reviewed and stable  Complications: No apparent anesthesia complications

## 2013-12-21 NOTE — Anesthesia Postprocedure Evaluation (Signed)
Anesthesia Post Note  Patient: [name]  Procedure(s) Performed: Procedure(s) (LRB): LEFT KNEE ARTHROSCOPY WITH MEDIAL MENISECTOMY (Left)  Anesthesia type: general  Patient location: PACU  Post pain: Pain level controlled  Post assessment: Patient's Cardiovascular Status Stable  Last Vitals:  Filed Vitals:   12/21/13 1115  BP: 131/78  Pulse: 96  Temp:   Resp: 20    Post vital signs: Reviewed and stable  Level of consciousness: sedated  Complications: No apparent anesthesia complications

## 2013-12-21 NOTE — Discharge Instructions (Signed)
Diet: As you were doing prior to hospitalization  ° °Shower:  May shower but keep the wounds dry, use an occlusive plastic wrap, NO SOAKING IN TUB.  If the bandage gets wet, change with a clean dry gauze. ° °Dressing:  You may change your dressing 3-5 days after surgery.  Then change the dressing daily with sterile gauze dressing.   ° °There are sticky tapes (steri-strips) on your wounds and all the stitches are absorbable.  Leave the steri-strips in place when changing your dressings, they will peel off with time, usually 2-3 weeks. ° °Activity:  Increase activity slowly as tolerated, but follow the weight bearing instructions below.  No lifting or driving for 6 weeks. ° °Weight Bearing:   As tolerated.   ° °To prevent constipation: you may use a stool softener such as - ° °Colace (over the counter) 100 mg by mouth twice a day  °Drink plenty of fluids (prune juice may be helpful) and high fiber foods °Miralax (over the counter) for constipation as needed.   ° °Itching:  If you experience itching with your medications, try taking only a single pain pill, or even half a pain pill at a time.  You may take up to 10 pain pills per day, and you can also use benadryl over the counter for itching or also to help with sleep.  ° °Precautions:  If you experience chest pain or shortness of breath - call 911 immediately for transfer to the hospital emergency department!! ° °If you develop a fever greater that 101 F, purulent drainage from wound, increased redness or drainage from wound, or calf pain -- Call the office at [phone]                                                °Follow- Up Appointment:  Please call for an appointment to be seen in 2 weeks [place] - [phone] ° ° ° ° ° °Post Anesthesia Home Care Instructions ° °Activity: °Get plenty of rest for the remainder of the day. A responsible adult should stay with you for 24 hours following the procedure.  °For the next 24 hours, DO NOT: °-Drive a car °-Operate  machinery °-Drink alcoholic beverages °-Take any medication unless instructed by your physician °-Make any legal decisions or sign important papers. ° °Meals: °Start with liquid foods such as gelatin or soup. Progress to regular foods as tolerated. Avoid greasy, spicy, heavy foods. If nausea and/or vomiting occur, drink only clear liquids until the nausea and/or vomiting subsides. Call your physician if vomiting continues. ° °Special Instructions/Symptoms: °Your throat may feel dry or sore from the anesthesia or the breathing tube placed in your throat during surgery. If this causes discomfort, gargle with warm salt water. The discomfort should disappear within 24 hours. ° °

## 2013-12-21 NOTE — H&P (Signed)
PREOPERATIVE H&P  Chief Complaint: LEFT KNEE TEAR MENISCUS MEDIAL KNEE  HPI: [name] is a 43 y.o. female who presents for preoperative history and physical with a diagnosis of LEFT KNEE TEAR MENISCUS MEDIAL KNEE. Symptoms are rated as moderate to severe, and have been worsening.  This is significantly impairing activities of daily living.  She has elected for surgical management. She has failed injections, activity modification, cannot tolerate anti-inflammatories due to reflux.  Past Medical History  Diagnosis Date  . Contact lens/glasses fitting     wears contacta or glasses  . Depression   . Hypertension   . GERD (gastroesophageal reflux disease)   . PONV (postoperative nausea and vomiting)    Past Surgical History  Procedure Laterality Date  . Cholecystectomy  2008    lap choli  . Tonsillectomy    . Ercp  2008  . Vaginal hysterectomy  2012    loa  . Dilation and curettage of uterus     History   Social History  . Marital Status: Married    Spouse Name: N/A    Number of Children: N/A  . Years of Education: N/A   Social History Main Topics  . Smoking status: Never Smoker   . Smokeless tobacco: None  . Alcohol Use: No  . Drug Use: No  . Sexual Activity: None   Other Topics Concern  . None   Social History Narrative  . None   History reviewed. No pertinent family history. Allergies  Allergen Reactions  . Oxycodone Hives  . Sulfa Antibiotics Hives  . Tamiflu [Oseltamivir Phosphate] Hives   Prior to Admission medications   Medication Sig Start Date End Date Taking? Authorizing Provider  esomeprazole (NEXIUM) 40 MG capsule Take 40 mg by mouth daily at 12 noon.   Yes Historical Provider, MD  lisinopril (PRINIVIL,ZESTRIL) 10 MG tablet Take 10 mg by mouth daily.   Yes Historical Provider, MD  sertraline (ZOLOFT) 100 MG tablet Take 100 mg by mouth daily.   Yes Historical Provider, MD     Positive ROS: All other systems have been reviewed and were  otherwise negative with the exception of those mentioned in the HPI and as above.  Physical Exam: General: Alert, no acute distress Cardiovascular: No pedal edema Respiratory: No cyanosis, no use of accessory musculature GI: No organomegaly, abdomen is soft and non-tender Skin: No lesions in the area of chief complaint Neurologic: Sensation intact distally Psychiatric: Patient is competent for consent with normal mood and affect Lymphatic: No axillary or cervical lymphadenopathy  MUSCULOSKELETAL: Left knee has positive medial joint line tenderness, minimal effusion, no gross instability to Lachman testing.  Assessment: LEFT KNEE TEAR MENISCUS MEDIAL KNEE, incidental mucoid degeneration of the anterior cruciate ligament, not clinically relevant to her pain.  Plan: Plan for Procedure(s): LEFT KNEE ARTHROSCOPY WITH MEDIAL MENISECTOMY  The risks benefits and alternatives were discussed with the patient including but not limited to the risks of nonoperative treatment, versus surgical intervention including infection, bleeding, nerve injury,  blood clots, cardiopulmonary complications, morbidity, mortality, among others, and they were willing to proceed.   Eulas Post, MD Cell [phone]   12/21/2013 7:21 AM

## 2013-12-21 NOTE — Anesthesia Procedure Notes (Signed)
Procedure Name: LMA Insertion Date/Time: 12/21/2013 9:59 AM Performed by: Curly [place] Pre-anesthesia Checklist: Patient identified, Emergency Drugs available, Suction available and Patient being monitored Patient Re-evaluated:Patient Re-evaluated prior to inductionOxygen Delivery Method: Circle System Utilized Preoxygenation: Pre-oxygenation with 100% oxygen Intubation Type: IV induction Ventilation: Mask ventilation without difficulty LMA: LMA inserted LMA Size: 4.0 Number of attempts: 1 Airway Equipment and Method: bite block Placement Confirmation: positive ETCO2 and breath sounds checked- equal and bilateral Tube secured with: Tape Dental Injury: Teeth and Oropharynx as per pre-operative assessment

## 2013-12-21 NOTE — Anesthesia Preprocedure Evaluation (Signed)
Anesthesia Evaluation   Patient awake    Reviewed: Allergy & Precautions, H&P , NPO status , Patient's Chart, lab work & pertinent test results  History of Anesthesia Complications (+) PONV and history of anesthetic complications  Airway       Dental   Pulmonary neg pulmonary ROS,          Cardiovascular hypertension,     Neuro/Psych PSYCHIATRIC DISORDERS Depression negative neurological ROS     GI/Hepatic Neg liver ROS, GERD-  ,  Endo/Other  Morbid obesity  Renal/GU negative Renal ROS     Musculoskeletal   Abdominal   Peds  Hematology negative hematology ROS (+)   Anesthesia Other Findings   Reproductive/Obstetrics                           Anesthesia Physical Anesthesia Plan  ASA: III  Anesthesia Plan: General   Post-op Pain Management:    Induction: Intravenous  Airway Management Planned: LMA  Additional Equipment:   Intra-op Plan:   Post-operative Plan: Extubation in OR  Informed Consent:   Plan Discussed with: CRNA, Anesthesiologist and Surgeon  Anesthesia Plan Comments:         Anesthesia Quick Evaluation

## 2013-12-24 ENCOUNTER — Encounter (HOSPITAL_BASED_OUTPATIENT_CLINIC_OR_DEPARTMENT_OTHER): Payer: Self-pay | Admitting: Orthopedic Surgery

## 2014-09-27 ENCOUNTER — Other Ambulatory Visit: Payer: Self-pay | Admitting: Obstetrics and Gynecology

## 2014-09-27 DIAGNOSIS — N644 Mastodynia: Secondary | ICD-10-CM

## 2014-10-02 ENCOUNTER — Other Ambulatory Visit: Payer: Self-pay

## 2014-10-02 ENCOUNTER — Ambulatory Visit
Admission: RE | Admit: 2014-10-02 | Discharge: 2014-10-02 | Disposition: A | Discharge status: Home / Self Care | Payer: BC Managed Care – PPO | Source: Ambulatory Visit | Attending: Obstetrics and Gynecology | Admitting: Obstetrics and Gynecology

## 2014-10-02 DIAGNOSIS — N644 Mastodynia: Secondary | ICD-10-CM

## 2015-03-04 ENCOUNTER — Other Ambulatory Visit: Payer: Self-pay | Admitting: Obstetrics and Gynecology

## 2015-03-05 LAB — CYTOLOGY - PAP

## 2016-04-15 ENCOUNTER — Ambulatory Visit (INDEPENDENT_AMBULATORY_CARE_PROVIDER_SITE_OTHER): Payer: BC Managed Care – PPO | Admitting: Pulmonary Disease

## 2016-04-15 ENCOUNTER — Encounter: Payer: Self-pay | Admitting: Pulmonary Disease

## 2016-04-15 VITALS — BP 128/88 | HR 102 | Ht 63.0 in | Wt 241.0 lb

## 2016-04-15 DIAGNOSIS — G4710 Hypersomnia, unspecified: Secondary | ICD-10-CM | POA: Diagnosis not present

## 2016-04-15 NOTE — Progress Notes (Signed)
Subjective:    Patient ID: Renee MacleodJulie M Colon, female    DOB: 12-26-1970, 45 y.o.   MRN: [id number]  HPI   This is the case of [name], 45 y.o. Female, who was referred by Lenise HeraldBenjamin Mann PA in consultation regarding possible OSA.  As you very well know, patient is a non smoker, has seasonal asthma controlled by prn meds. Not been diagnosed with COPD.   Patient is complaining of tiredness and fatigue. She has snoring, possible witnessed apneas, gasping, choking, hypersomnia. Has frequent awekenings. Sleeps 7 hrs/night.  Wakes up unrefreshed. Hypersomnia affects her fxnality.  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  Hypersomnia affects her fxnaliy.  Usually sleepy in pm. Hard to focus in pm.   She has a long commute.  Gets sleepy driving.   (-) abnormal behavior in sleep.   ESS 11     Review of Systems  Constitutional: Negative.  Negative for fever and unexpected weight change.  HENT: Positive for congestion and rhinorrhea. Negative for dental problem, ear pain, nosebleeds, postnasal drip, sinus pressure, sneezing, sore throat and trouble swallowing.   Eyes: Negative.  Negative for redness and itching.  Respiratory: Negative.  Negative for cough, chest tightness, shortness of breath and wheezing.   Cardiovascular: Negative.  Negative for palpitations and leg swelling.  Gastrointestinal: Negative.  Negative for nausea and vomiting.  Endocrine: Negative.   Genitourinary: Negative.  Negative for dysuria.  Musculoskeletal: Negative.  Negative for joint swelling.  Skin: Negative.  Negative for rash.  Allergic/Immunologic: Positive for environmental allergies.  Neurological: Negative.  Negative for headaches.  Hematological: Negative.  Does not bruise/bleed easily.  Psychiatric/Behavioral: Negative.  Negative for dysphoric mood. The patient is not nervous/anxious.    Past Medical History:  Diagnosis Date  . Contact lens/glasses fitting    wears contacta or glasses  . Depression    . GERD (gastroesophageal reflux disease)   . Hypertension   . PONV (postoperative nausea and vomiting)   . Tear of medial meniscus of right knee 12/21/2013   (-) CA, DVT  No family history on file.  Father has CopyDeemntia and has psych issues, HTN. Mother is healthy.   Past Surgical History:  Procedure Laterality Date  . CHOLECYSTECTOMY  2008   lap choli  . DILATION AND CURETTAGE OF UTERUS    . ERCP  2008  . KNEE ARTHROSCOPY WITH MEDIAL MENISECTOMY Left 12/21/2013   Procedure: LEFT KNEE ARTHROSCOPY WITH MEDIAL MENISECTOMY;  Surgeon: Eulas PostJoshua P Landau, MD;  Location: [facility];  Service: Orthopedics;  Laterality: Left;  . TONSILLECTOMY    . VAGINAL HYSTERECTOMY  2012   loa    Social History   Social History  . Marital status: Married    Spouse name: N/A  . Number of children: N/A  . Years of education: N/A   Occupational History  . Not on file.   Social History Main Topics  . Smoking status: Never Smoker  . Smokeless tobacco: Not on file  . Alcohol use No  . Drug use: No  . Sexual activity: Not on file   Other Topics Concern  . Not on file   Social History Narrative  . No narrative on file   Married with a son. [place] in [place]    Allergies  Allergen Reactions  . Oxycodone Hives  . Sulfa Antibiotics Hives  . Tamiflu [Oseltamivir Phosphate] Hives     Outpatient Medications Prior to Visit  Medication Sig Dispense Refill  .  esomeprazole (NEXIUM) 40 MG capsule Take 40 mg by mouth daily at 12 noon.    Marland Kitchen lisinopril (PRINIVIL,ZESTRIL) 10 MG tablet Take 10 mg by mouth daily.    . sertraline (ZOLOFT) 100 MG tablet Take 100 mg by mouth daily.    Marland Kitchen HYDROcodone-acetaminophen (NORCO) 10-325 MG per tablet Take 1-2 tablets by mouth every 6 (six) hours as needed. 75 tablet 0  . ketorolac (TORADOL) 10 MG tablet Take 1 tablet (10 mg total) by mouth every 6 (six) hours as needed. 20 tablet 0  . promethazine (PHENERGAN) 25 MG tablet Take 1  tablet (25 mg total) by mouth every 6 (six) hours as needed for nausea or vomiting. 30 tablet 0   No facility-administered medications prior to visit.    Meds ordered this encounter  Medications  . ALPRAZolam (XANAX) 0.5 MG tablet    Sig: Take 1 tablet by mouth as needed.        Objective:   Physical Exam    Vitals:  Vitals:   04/15/16 1606  BP: 128/88  Pulse: (!) 102  SpO2: 94%  Weight: 241 lb (109.3 kg)  Height: 5\' 3"  (1.6 m)    Constitutional/General:  Pleasant, well-nourished, well-developed, not in any distress,  Comfortably seating.  Well kempt  Body mass index is 42.69 kg/m. Wt Readings from Last 3 Encounters:  04/15/16 241 lb (109.3 kg)  12/21/13 230 lb 6 oz (104.5 kg)    Neck circumference: 18 in  HEENT: Pupils equal and reactive to light and accommodation. Anicteric sclerae. Normal nasal mucosa.   No oral  lesions,  mouth clear,  oropharynx clear, no postnasal drip. (-) Oral thrush. No dental caries.  Airway - Mallampati class III  Neck: No masses. Midline trachea. No JVD, (-) LAD. (-) bruits appreciated.  Respiratory/Chest: Grossly normal chest. (-) deformity. (-) Accessory muscle use.  Symmetric expansion. (-) Tenderness on palpation.  Resonant on percussion.  Diminished BS on both lower lung zones. (-) wheezing, crackles, rhonchi (-) egophony  Cardiovascular: Regular rate and  rhythm, heart sounds normal, no murmur or gallops, no peripheral edema  Gastrointestinal:  Normal bowel sounds. Soft, non-tender. No hepatosplenomegaly.  (-) masses.   Musculoskeletal:  Normal muscle tone. Normal gait.   Extremities: Grossly normal. (-) clubbing, cyanosis.  (-) edema  Skin: (-) rash,lesions seen.   Neurological/Psychiatric : alert, oriented to time, place, person. Normal mood and affect        Assessment & Plan:  Hypersomnia Patient is complaining of tiredness and fatigue. She has snoring, possible witnessed apneas, gasping, choking,  hypersomnia. Has frequent awekenings. Sleeps 7 hrs/night.  Wakes up unrefreshed. Hypersomnia affects her fxnality.  She is a Pension scheme manager.  Hypersomnia affects her fxnaliy.  Usually sleepy in pm. Hard to focus in pm.   She has a long commute.  Gets sleepy driving.   (-) abnormal behavior in sleep.   ESS 11  Plan:  We discussed about the diagnosis of Obstructive Sleep Apnea (OSA) and implications of untreated OSA. We discussed about CPAP and BiPaP as possible treatment options.    We will schedule the patient for a sleep study. Need to expedite study as pt is very symptomatic. Plan for a HST.  If insurance will not approve, plan for a split night study.    Patient was instructed to call the office if he/she has not heard back from the office 1-2 weeks after the sleep study.   Patient was instructed to call the office if he/she  is having issues with the PAP device.   We discussed good sleep hygiene.   Patient was advised not to engage in activities requiring concentration and/or vigilance if he/she is sleepy.  Patient was advised not to drive if he/she is sleepy.    Morbid obesity (HCC) Weight reduction   Thank you very much for letting me participate in this patient's care. Please do not hesitate to give me a call if you have any questions or concerns regarding the treatment plan.   Patient will follow up with me in 6-8 weeks    J. Alexis Frock, MD 04/16/2016   2:08 AM Pulmonary and Critical Care Medicine [facility] Pager: [phone] Office: [phone], Fax: [phone]

## 2016-04-15 NOTE — Patient Instructions (Signed)

## 2016-04-16 DIAGNOSIS — G4710 Hypersomnia, unspecified: Secondary | ICD-10-CM | POA: Insufficient documentation

## 2016-04-16 NOTE — Assessment & Plan Note (Signed)
Weight reduction 

## 2016-04-16 NOTE — Assessment & Plan Note (Signed)
Patient is complaining of tiredness and fatigue. She has snoring, possible witnessed apneas, gasping, choking, hypersomnia. Has frequent awekenings. Sleeps 7 hrs/night.  Wakes up unrefreshed. Hypersomnia affects her fxnality.  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  Hypersomnia affects her fxnaliy.  Usually sleepy in pm. Hard to focus in pm.   She has a long commute.  Gets sleepy driving.   (-) abnormal behavior in sleep.   ESS 11  Plan:  We discussed about the diagnosis of Obstructive Sleep Apnea (OSA) and implications of untreated OSA. We discussed about CPAP and BiPaP as possible treatment options.    We will schedule the patient for a sleep study. Need to expedite study as pt is very symptomatic. Plan for a HST.  If insurance will not approve, plan for a split night study.    Patient was instructed to call the office if he/she has not heard back from the office 1-2 weeks after the sleep study.   Patient was instructed to call the office if he/she is having issues with the PAP device.   We discussed good sleep hygiene.   Patient was advised not to engage in activities requiring concentration and/or vigilance if he/she is sleepy.  Patient was advised not to drive if he/she is sleepy.

## 2016-04-21 DIAGNOSIS — G4733 Obstructive sleep apnea (adult) (pediatric): Secondary | ICD-10-CM | POA: Diagnosis not present

## 2016-04-27 ENCOUNTER — Telehealth: Payer: Self-pay | Admitting: Pulmonary Disease

## 2016-04-27 NOTE — Telephone Encounter (Signed)
Spoke with pt and advised. She is fine with that. Nothing further needed.

## 2016-04-27 NOTE — Telephone Encounter (Signed)
Spoke with Pt and she is requesting results of home sleep test.  Please advise if test has been read and if results available.

## 2016-04-27 NOTE — Telephone Encounter (Signed)
pls tell pt I will read the study today and will let her know in am. Thanks.  Pollie MeyerJ. Angelo A de Dios, MD 04/27/2016, 1:43 PM [facility] Pager [phone] After 3 pm or if no answer, call [phone]

## 2016-04-29 ENCOUNTER — Telehealth: Payer: Self-pay | Admitting: Pulmonary Disease

## 2016-04-29 DIAGNOSIS — G4710 Hypersomnia, unspecified: Secondary | ICD-10-CM

## 2016-04-29 NOTE — Telephone Encounter (Signed)
    Patient had a home sleep study on 04/21/2016.  Pt stops breathing  2.8   times an hour.   Patient is very symptomatic. She has hypersomnia affecting her functionality. She gets sleepy driving. 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  I called up the patient and told the results of the home sleep study. She thought she did well but when she woke up in the morning, her nasal cannula was not attached to her nose. It was off her face. That could explain why the sleep study was negative.  Sally/Sheena/Ashtyn : Do you  think her medical insurance will approve a split night lab sleep study? If so, then we expedite and do it as soon as possible as she is very symptomatic at work? Can we also do it at the sleep lab [facility] at [facility] as it will be nearer to her?  Just let them send the study for me to review and interpret. Thank you very much!  J. Alexis FrockAngelo A de Dios, MD 04/29/2016, 10:33 AM

## 2016-04-29 NOTE — Telephone Encounter (Signed)
I called [facility] her insurance and she can go do a [address] split night study no precert will be required

## 2016-04-29 NOTE — Telephone Encounter (Signed)
Ok. Thanks.  Sheena/Ashtyn : can we pls order a split night study?  Thanks.  Pollie MeyerJ. Angelo A de Dios, MD 04/29/2016, 2:27 PM [facility] Pager [phone] After 3 pm or if no answer, call [phone]

## 2016-04-29 NOTE — Telephone Encounter (Signed)
Order placed

## 2016-05-09 ENCOUNTER — Ambulatory Visit: Payer: BC Managed Care – PPO | Attending: Pulmonary Disease | Admitting: Pulmonary Disease

## 2016-05-09 DIAGNOSIS — G4733 Obstructive sleep apnea (adult) (pediatric): Secondary | ICD-10-CM | POA: Diagnosis not present

## 2016-05-09 DIAGNOSIS — G4710 Hypersomnia, unspecified: Secondary | ICD-10-CM | POA: Diagnosis not present

## 2016-05-11 DIAGNOSIS — G4733 Obstructive sleep apnea (adult) (pediatric): Secondary | ICD-10-CM | POA: Diagnosis not present

## 2016-05-12 ENCOUNTER — Telehealth: Payer: Self-pay | Admitting: Pulmonary Disease

## 2016-05-12 ENCOUNTER — Other Ambulatory Visit: Payer: Self-pay | Admitting: *Deleted

## 2016-05-12 ENCOUNTER — Institutional Professional Consult (permissible substitution): Payer: BC Managed Care – PPO | Admitting: Pulmonary Disease

## 2016-05-12 DIAGNOSIS — G4733 Obstructive sleep apnea (adult) (pediatric): Secondary | ICD-10-CM

## 2016-05-12 DIAGNOSIS — G4710 Hypersomnia, unspecified: Secondary | ICD-10-CM

## 2016-05-12 NOTE — Telephone Encounter (Signed)
  Please call the pt and tell the pt the INLAB LEEP STUDY  showed OSA .   Pt stops breathing 6   times an hour.    Please order autoCPAP 5-15 cm H2O. Patient will need a mask fitting session. Patient will need a 1 month download.   Patient needs to be seen by me or any of the NPs/APPs  4-6 weeks after obtaining the cpap machine. Let me know if you receive this.   Thanks!   J. Alexis FrockAngelo A de Dios, MD 05/12/2016, 1:22 AM

## 2016-05-12 NOTE — Procedures (Signed)
    NAME: Renee MacleodJulie M Colon DATE OF BIRTH:  08-17-1970 MEDICAL RECORD NUMBER [id number]  LOCATION: [facility]  PHYSICIAN: Daneen SchickJose Angelo A De Dios  DATE OF STUDY: 05/09/2016  CLINICAL INFORMATION  Sleep Study Type: NPSG  Indication for sleep study: Excessive Daytime Sleepiness  Epworth Sleepiness Score: 19   SLEEP STUDY TECHNIQUE  As per the AASM Manual for the Scoring of Sleep and Associated Events v2.3 (April 2016) with a hypopnea requiring 4% desaturations.  The channels recorded and monitored were frontal, central and occipital EEG, electrooculogram (EOG), submentalis EMG (chin), nasal and oral airflow, thoracic and abdominal wall motion, anterior tibialis EMG, snore microphone, electrocardiogram, and pulse oximetry.   MEDICATIONS  Medications self-administered by patient taken the night of the study : N/A. Meds reviewed per chart review.  SLEEP ARCHITECTURE  The study was initiated at 99:99:99 and ended at 99:99:99.  Sleep onset time was 20.0 minutes and the sleep efficiency was 222.2%. The total sleep time was 380.0 minutes.  Stage REM latency was 5.5 minutes.  The patient spent 5.55% of the night in stage N1 sleep, 5.55% in stage N2 sleep, 5.55% in stage N3 and 5.55% in REM.  Alpha intrusion was absent.  Supine sleep was 5.55%.   RESPIRATORY PARAMETERS  The overall apnea/hypopnea index (AHI) was 5.5 per hour. There were 5 total apneas, including 5 obstructive, 5 central and 5 mixed apneas. There were 5 hypopneas and 5 RERAs.  The AHI during Stage REM sleep was 5.5 per hour. AHI while supine was 5.5 per hour.  The mean oxygen saturation was 100.00%. The minimum SpO2 during sleep was 100.00%.  Loud snoring was noted during this study.  CARDIAC DATA  The 2 lead EKG demonstrated sinus rhythm. The mean heart rate was 100.00 beats per minute. Other EKG findings include: PVCs.   LEG MOVEMENT DATA  The total PLMS were 5 with a resulting PLMS index of 5.55.  Associated arousal with leg movement index was 5.5 .  IMPRESSIONS  1. Mild obstructive sleep apnea occurred during this study (AHI = 5.5/h). 2. The patient had minimal or no oxygen desaturation during the study (Min O2 = 100.00%) 3. The patient snored with Loud snoring volume. 4. EKG findings include PVCs. 5. Mild periodic limb movements of sleep occurred during the study. Associated arousals were significant.  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 As patient's OSA is mild, suggest trial with autocpap 5-15 cm water. She will need a mask fitting session to determine best mask fit. 2. Consider oral bite guard for bruxism 3. Avoid alcohol, sedatives and other CNS depressants that may worsen sleep apnea and disrupt normal sleep architecture. 4. Sleep hygiene should be reviewed to assess factors that may improve sleep quality. 5. Weight management and regular exercise should be initiated or continued if appropriate. 6. Follow up in the office 4-6 weeks after obtaining cpap machine.  Pollie MeyerJ. Angelo A de Dios, MD 05/12/2016, 1:20 AM [facility] Pager [phone] After 3 pm or if no answer, call [phone]

## 2016-05-14 NOTE — Telephone Encounter (Signed)
I have faxed all documents to [facility] and called and left message for them to call and explain to the patient that the machine would have to be ordered for her.

## 2016-05-14 NOTE — Telephone Encounter (Signed)
Results have been explained to patient, pt expressed understanding.  Order placed for CPAP  Patient has a scheduled with Markus Dafte de Dios on  11/29 at 345.  [facility] is requesting that this be taken care of today if at all possible as she is off work today.  Pt requests to use [facility] too. Thanks.

## 2016-06-16 ENCOUNTER — Ambulatory Visit: Payer: BC Managed Care – PPO | Admitting: Pulmonary Disease

## 2016-07-16 ENCOUNTER — Ambulatory Visit: Payer: BC Managed Care – PPO | Admitting: Pulmonary Disease

## 2017-09-01 ENCOUNTER — Encounter (INDEPENDENT_AMBULATORY_CARE_PROVIDER_SITE_OTHER): Payer: Self-pay

## 2017-10-29 ENCOUNTER — Encounter (HOSPITAL_COMMUNITY): Payer: Self-pay | Admitting: Emergency Medicine

## 2017-10-29 ENCOUNTER — Other Ambulatory Visit: Payer: Self-pay

## 2017-10-29 ENCOUNTER — Emergency Department (HOSPITAL_COMMUNITY)
Admission: EM | Admit: 2017-10-29 | Discharge: 2017-10-30 | Disposition: A | Discharge status: Other Acute Inpatient Hospital | Payer: BC Managed Care – PPO | Attending: Emergency Medicine | Admitting: Emergency Medicine

## 2017-10-29 DIAGNOSIS — I10 Essential (primary) hypertension: Secondary | ICD-10-CM | POA: Insufficient documentation

## 2017-10-29 DIAGNOSIS — F332 Major depressive disorder, recurrent severe without psychotic features: Secondary | ICD-10-CM | POA: Diagnosis not present

## 2017-10-29 DIAGNOSIS — Z008 Encounter for other general examination: Secondary | ICD-10-CM | POA: Insufficient documentation

## 2017-10-29 DIAGNOSIS — F329 Major depressive disorder, single episode, unspecified: Secondary | ICD-10-CM | POA: Diagnosis present

## 2017-10-29 DIAGNOSIS — F32A Depression, unspecified: Secondary | ICD-10-CM

## 2017-10-29 DIAGNOSIS — Z79899 Other long term (current) drug therapy: Secondary | ICD-10-CM | POA: Diagnosis not present

## 2017-10-29 LAB — COMPREHENSIVE METABOLIC PANEL
ALBUMIN: 4 g/dL (ref 3.5–5.0)
ALT: 19 U/L (ref 14–54)
AST: 19 U/L (ref 15–41)
Alkaline Phosphatase: 52 U/L (ref 38–126)
Anion gap: 10 (ref 5–15)
BUN: 11 mg/dL (ref 6–20)
CHLORIDE: 104 mmol/L (ref 101–111)
CO2: 23 mmol/L (ref 22–32)
CREATININE: 0.65 mg/dL (ref 0.44–1.00)
Calcium: 9.4 mg/dL (ref 8.9–10.3)
GFR calc non Af Amer: >60 mL/min (ref >60)
GLUCOSE: 123 mg/dL — AB (ref 65–99)
Potassium: 3.8 mmol/L (ref 3.5–5.1)
SODIUM: 137 mmol/L (ref 135–145)
Total Bilirubin: 0.5 mg/dL (ref 0.3–1.2)
Total Protein: 7.2 g/dL (ref 6.5–8.1)

## 2017-10-29 LAB — CBC WITH DIFFERENTIAL/PLATELET
Basophils Absolute: 0 10*3/uL (ref 0.0–0.1)
Basophils Relative: 0 %
Eosinophils Absolute: 0.2 10*3/uL (ref 0.0–0.7)
Eosinophils Relative: 2 %
HEMATOCRIT: 40.8 % (ref 36.0–46.0)
Hemoglobin: 13.4 g/dL (ref 12.0–15.0)
LYMPHS ABS: 3.9 10*3/uL (ref 0.7–4.0)
LYMPHS PCT: 38 %
MCH: 29.3 pg (ref 26.0–34.0)
MCHC: 32.8 g/dL (ref 30.0–36.0)
MCV: 89.1 fL (ref 78.0–100.0)
Monocytes Absolute: 0.8 10*3/uL (ref 0.1–1.0)
Monocytes Relative: 8 %
NEUTROS PCT: 52 %
Neutro Abs: 5.1 10*3/uL (ref 1.7–7.7)
Platelets: 360 10*3/uL (ref 150–400)
RBC: 4.58 MIL/uL (ref 3.87–5.11)
RDW: 14.2 % (ref 11.5–15.5)
WBC: 10 10*3/uL (ref 4.0–10.5)

## 2017-10-29 LAB — RAPID URINE DRUG SCREEN, HOSP PERFORMED
Amphetamines: NOT DETECTED
Barbiturates: NOT DETECTED
Benzodiazepines: POSITIVE — AB
Cocaine: NOT DETECTED
OPIATES: NOT DETECTED
TETRAHYDROCANNABINOL: NOT DETECTED

## 2017-10-29 LAB — I-STAT BETA HCG BLOOD, ED (MC, WL, AP ONLY): I-stat hCG, quantitative: <5 m[IU]/mL (ref <5)

## 2017-10-29 NOTE — ED Provider Notes (Signed)
[facility]   CSN: [id number] Arrival date & time: 10/29/17  2141     History   Chief Complaint Chief Complaint  Patient presents with  . Medical Clearance    HPI [name] is a 47 y.o. female who presents to the ED for medical clearance. Patient reports being stressed due to work and marriage. She states she is depressed but does not want to hurt herself or anyone else. 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 She has been working the same job for several years. Patient states that her son will be graduating high school this year and she realized he will no longer need her. Patient's husband has stressful job as a Chartered certified accountantparol officer and they never seem to have time for each other. The patient states that she has recently reached out to a friend that she knew from years back and he seems to listen and be concerned about her. She told her husband about the situation today and about her feelings. Patient reports that her husband is willing to go to counseling. Patient's mother is with her tonight and is concerned because the patient is already see a psychiatrist and is taking Zoloft 150 mg daily and now is to the point she does not want to get out bed, shower or go to work. Patient states she feels worthless and doesn't care if something happens to her because she is of no use to anyone.     The history is provided by the patient. No language interpreter was used.  Mental Health Problem  Presenting symptoms: depression   Presenting symptoms: no suicidal thoughts   Patient accompanied by:  Parent Degree of incapacity (severity):  Severe Onset quality:  Gradual Timing:  Constant Progression:  Worsening Relieved by:  Nothing Worsened by:  Family interactions and lack of sleep Ineffective treatments:  Antidepressants Associated symptoms: anxiety, fatigue, feelings of worthlessness, insomnia and poor  judgment   Risk factors: family hx of mental illness and hx of mental illness     Past Medical History:  Diagnosis Date  . Contact lens/glasses fitting    wears contacta or glasses  . Depression   . GERD (gastroesophageal reflux disease)   . Hypertension   . PONV (postoperative nausea and vomiting)   . Tear of medial meniscus of right knee 12/21/2013    Patient Active Problem List   Diagnosis Date Noted  . Hypersomnia 04/16/2016  . Morbid obesity (HCC) 04/16/2016  . Tear of medial meniscus of left knee 12/21/2013    Past Surgical History:  Procedure Laterality Date  . CHOLECYSTECTOMY  2008   lap choli  . DILATION AND CURETTAGE OF UTERUS    . ERCP  2008  . KNEE ARTHROSCOPY WITH MEDIAL MENISECTOMY Left 12/21/2013   Procedure: LEFT KNEE ARTHROSCOPY WITH MEDIAL MENISECTOMY;  Surgeon: Eulas PostJoshua P Landau, MD;  Location: [facility];  Service: Orthopedics;  Laterality: Left;  . TONSILLECTOMY    . VAGINAL HYSTERECTOMY  2012   loa     OB History   None      Home Medications    Prior to Admission medications   Medication Sig Start Date End Date Taking? Authorizing Provider  ALPRAZolam Prudy Feeler(XANAX) 0.5 MG tablet Take 1 tablet by mouth daily as needed for anxiety.  04/07/16  Yes [provider]  esomeprazole (NEXIUM) 40 MG capsule Take 40-80 mg by mouth daily as needed (  for acid reflux).    Yes [provider]  ibuprofen (ADVIL,MOTRIN) 200 MG tablet Take 600 mg by mouth every 6 (six) hours as needed for mild pain or moderate pain.   Yes [provider]  lisinopril (PRINIVIL,ZESTRIL) 20 MG tablet Take 20 mg by mouth every evening.    Yes [provider]  PROAIR HFA 108 (90 Base) MCG/ACT inhaler INHALE 2 PUFFS BY MOUTH EVERY 6 HOURS AS NEEDED FOR SHORTNESS OF BREATH 08/26/17  Yes [provider]  sertraline (ZOLOFT) 100 MG tablet Take 150 mg by mouth daily.    Yes [provider]    Family History No family history on  file.  Social History Social History   Tobacco Use  . Smoking status: Never Smoker  . Smokeless tobacco: Never Used  Substance Use Topics  . Alcohol use: No  . Drug use: No     Allergies   Oxycodone; Sulfa antibiotics; and Tamiflu [oseltamivir phosphate]   Review of Systems Review of Systems  Constitutional: Positive for fatigue.  Psychiatric/Behavioral: Positive for decreased concentration and sleep disturbance. Negative for suicidal ideas. The patient is nervous/anxious and has insomnia.        Depressed  All other systems reviewed and are negative.    Physical Exam Updated Vital Signs BP (!) 145/103 (BP Location: Right Arm)   Pulse 73   Temp 97.8 F (36.6 C) (Oral)   Resp 17   Ht 5\' 3"  (1.6 m)   Wt 106.6 kg (235 lb)   SpO2 95%   BMI 41.63 kg/m   Physical Exam  Constitutional: She is oriented to person, place, and time. She appears well-developed and well-nourished.  Eyes: EOM are normal.  Neck: Neck supple.  Pulmonary/Chest: Effort normal.  Abdominal: Soft. There is no tenderness.  Musculoskeletal: Normal range of motion.  Neurological: She is alert and oriented to person, place, and time. No cranial nerve deficit.  Skin: Skin is warm and dry.  Psychiatric: Her speech is normal. She is withdrawn. She exhibits a depressed mood.  Nursing note and vitals reviewed.    ED Treatments / Results  Labs (all labs ordered are listed, but only abnormal results are displayed) Labs Reviewed  COMPREHENSIVE METABOLIC PANEL - Abnormal; Notable for the following components:      Result Value   Glucose, Bld 123 (*)    All other components within normal limits  RAPID URINE DRUG SCREEN, HOSP PERFORMED - Abnormal; Notable for the following components:   Benzodiazepines POSITIVE (*)    All other components within normal limits  ETHANOL  CBC WITH DIFFERENTIAL/PLATELET  I-STAT BETA HCG BLOOD, ED ([facility], [facility], [facility] ONLY)   Radiology No results found.  Procedures Procedures  (including critical care time)  Medications Ordered in ED Medications - No data to display   Initial Impression / Assessment and Plan / ED Course  I have reviewed the triage vital signs and the nursing notes. 47 y.o. female with hx of depression and here tonight asking for help because she feels like she has no one who needs her and that she "has hit rock bottom". Patient awaiting Psych consult. Care turned over to Dr. Lars Mage @ 12:45 am.   Final Clinical Impressions(s) / ED Diagnoses   Final diagnoses:  Depression, unspecified depression type    ED Discharge Orders    None       Kerrie Buffalo Edison, NP 10/30/17 Marcie Bal    Devoria Albe, MD 10/30/17 [phone]

## 2017-10-29 NOTE — ED Triage Notes (Signed)
Pt states she is stressed due to work and with marriage. She states she is depressed but does not want to hurt herself or anyone else. Pt is also not sleeping well at night.

## 2017-10-29 NOTE — ED Notes (Signed)
Patient states that she battles with depression and insomnia but she does not want to hurt herself at this time.

## 2017-10-30 ENCOUNTER — Inpatient Hospital Stay (HOSPITAL_COMMUNITY)
Admission: AD | Admit: 2017-10-30 | Discharge: 2017-11-01 | DRG: 885 | Disposition: A | Discharge status: Home / Self Care | Payer: BC Managed Care – PPO | Source: Intra-hospital | Attending: Psychiatry | Admitting: Psychiatry

## 2017-10-30 ENCOUNTER — Encounter (HOSPITAL_COMMUNITY): Payer: Self-pay

## 2017-10-30 DIAGNOSIS — I10 Essential (primary) hypertension: Secondary | ICD-10-CM | POA: Diagnosis present

## 2017-10-30 DIAGNOSIS — F419 Anxiety disorder, unspecified: Secondary | ICD-10-CM

## 2017-10-30 DIAGNOSIS — R45851 Suicidal ideations: Secondary | ICD-10-CM | POA: Diagnosis present

## 2017-10-30 DIAGNOSIS — F332 Major depressive disorder, recurrent severe without psychotic features: Principal | ICD-10-CM | POA: Diagnosis present

## 2017-10-30 DIAGNOSIS — K219 Gastro-esophageal reflux disease without esophagitis: Secondary | ICD-10-CM | POA: Diagnosis present

## 2017-10-30 DIAGNOSIS — Z79899 Other long term (current) drug therapy: Secondary | ICD-10-CM | POA: Diagnosis not present

## 2017-10-30 DIAGNOSIS — G4700 Insomnia, unspecified: Secondary | ICD-10-CM | POA: Diagnosis present

## 2017-10-30 DIAGNOSIS — Z818 Family history of other mental and behavioral disorders: Secondary | ICD-10-CM | POA: Diagnosis not present

## 2017-10-30 DIAGNOSIS — F418 Other specified anxiety disorders: Secondary | ICD-10-CM | POA: Diagnosis present

## 2017-10-30 LAB — ETHANOL: Alcohol, Ethyl (B): <10 mg/dL (ref <10)

## 2017-10-30 MED ORDER — HYDROXYZINE HCL 25 MG PO TABS
25.0000 mg | ORAL_TABLET | Freq: Three times a day (TID) | ORAL | Status: DC | PRN
Start: 2017-10-30 — End: 2017-11-02
  Administered 2017-10-30 (×2): 25 mg via ORAL
  Filled 2017-10-30 (×2): qty 1

## 2017-10-30 MED ORDER — ACETAMINOPHEN 325 MG PO TABS: 650.0000 mg | ORAL_TABLET | Freq: Four times a day (QID) | ORAL | Status: DC | PRN

## 2017-10-30 MED ORDER — ALPRAZOLAM 0.5 MG PO TABS: 0.5000 mg | ORAL_TABLET | Freq: Every day | ORAL | Status: DC | PRN

## 2017-10-30 MED ORDER — ALBUTEROL SULFATE HFA 108 (90 BASE) MCG/ACT IN AERS: 1.0000 | INHALATION_SPRAY | RESPIRATORY_TRACT | Status: DC | PRN

## 2017-10-30 MED ORDER — SERTRALINE HCL 50 MG PO TABS
150.0000 mg | ORAL_TABLET | Freq: Every day | ORAL | Status: DC
  Administered 2017-10-30: 150 mg via ORAL
  Filled 2017-10-30 (×3): qty 1

## 2017-10-30 MED ORDER — TRAZODONE HCL 50 MG PO TABS
50.0000 mg | ORAL_TABLET | Freq: Every evening | ORAL | Status: DC | PRN
  Administered 2017-10-31: 50 mg via ORAL
  Filled 2017-10-30 (×2): qty 1

## 2017-10-30 MED ORDER — SERTRALINE HCL 100 MG PO TABS
100.0000 mg | ORAL_TABLET | Freq: Every day | ORAL | Status: DC
  Administered 2017-10-31 – 2017-11-01 (×2): 100 mg via ORAL
  Filled 2017-10-30 (×4): qty 1

## 2017-10-30 MED ORDER — IBUPROFEN 600 MG PO TABS: 600.0000 mg | ORAL_TABLET | Freq: Four times a day (QID) | ORAL | Status: DC | PRN

## 2017-10-30 MED ORDER — LISINOPRIL 20 MG PO TABS
20.0000 mg | ORAL_TABLET | Freq: Every evening | ORAL | Status: DC
  Administered 2017-10-30 – 2017-10-31 (×2): 20 mg via ORAL
  Filled 2017-10-30 (×4): qty 1

## 2017-10-30 MED ORDER — PANTOPRAZOLE SODIUM 40 MG PO TBEC
40.0000 mg | DELAYED_RELEASE_TABLET | Freq: Every day | ORAL | Status: DC
  Administered 2017-10-30 – 2017-11-01 (×3): 40 mg via ORAL
  Filled 2017-10-30 (×6): qty 1

## 2017-10-30 MED ORDER — MAGNESIUM HYDROXIDE 400 MG/5ML PO SUSP: 30.0000 mL | Freq: Every day | ORAL | Status: DC | PRN

## 2017-10-30 MED ORDER — ALUM & MAG HYDROXIDE-SIMETH 200-200-20 MG/5ML PO SUSP: 30.0000 mL | ORAL | Status: DC | PRN

## 2017-10-30 MED ORDER — ALPRAZOLAM 0.5 MG PO TABS: 0.5000 mg | ORAL_TABLET | Freq: Every evening | ORAL | Status: DC | PRN

## 2017-10-30 NOTE — [facility] Group Notes (Signed)
[facility] LCSW Group Therapy Note  Date/Time: 10/30/17, 1000  Type of Therapy/Topic:  Group Therapy:  Feelings about Diagnosis  Participation Level:  Minimal   Mood:   Description of Group:    This group will allow patients to explore their thoughts and feelings about diagnoses they have received. Patients will be guided to explore their level of understanding and acceptance of these diagnoses. Facilitator will encourage patients to process their thoughts and feelings about the reactions of others to their diagnosis, and will guide patients in identifying ways to discuss their diagnosis with significant others in their lives. This group will be process-oriented, with patients participating in exploration of their own experiences as well as giving and receiving support and challenge from other group members.   Therapeutic Goals: 1. Patient will demonstrate understanding of diagnosis as evidence by identifying two or more symptoms of the disorder:  2. Patient will be able to express two feelings regarding the diagnosis 3. Patient will demonstrate ability to communicate their needs through discussion and/or role plays  Summary of Patient Progress:Pt came into group towards the end and did not participate in discussion.  She did answer CSW question posed to her.        Therapeutic Modalities:   Cognitive Behavioral Therapy Brief Therapy Feelings Identification   Daleen SquibbGreg Windie Marasco, LCSW

## 2017-10-30 NOTE — Tx Team (Signed)
Initial Treatment Plan 10/30/2017 3:48 AM [name] [id number]    PATIENT STRESSORS: Medication change or noncompliance Occupational concerns   PATIENT STRENGTHS: Ability for insight Active sense of humor Average or above average intelligence Capable of independent living Communication skills Financial means General fund of knowledge Motivation for treatment/growth Physical Health Supportive family/friends Work skills   PATIENT IDENTIFIED PROBLEMS: "I just want to change this feeling. I hate feeling flat and  worthless and I don't enjoy life like I used to."  "I feel like my medications are not working"                   DISCHARGE CRITERIA:  Ability to meet basic life and health needs Adequate post-discharge living arrangements Improved stabilization in mood, thinking, and/or behavior  PRELIMINARY DISCHARGE PLAN: Return to previous living arrangement Return to previous work or school arrangements  PATIENT/FAMILY INVOLVEMENT: This treatment plan has been presented to and reviewed with the patient, [name],.  The patient and family have been given the opportunity to ask questions and make suggestions.  Renee SpeakAshton E Kaleia Longhi, RN 10/30/2017, 3:48 AM

## 2017-10-30 NOTE — BH Assessment (Addendum)
Tele Assessment Note   Patient Name: [name] MRN: [id number] Referring Physician: Devoria Albe, MD Location of Patient: [name], [phone] Location of Provider: Behavioral Health TTS Department  [name] is an 47 y.o. married who presents female to [name] because her mother and husband are concerned for her wellbeing. Pt reports she has a history of depression and says one month ago she felt she was "bottoming out" and now she feels she has hit "rock bottom." Pt acknowledges symptoms including crying spells, social withdrawal, loss of interest in usual pleasures, fatigue, anhedonia, irritability, decreased concentration, decreased sleep, decreased appetite and feelings of guilt, worthlessness and hopelessness. She reports sleeping 3-4 hours per night for the past two weeks. She says she is staying in bed and not caring for her hygiene and grooming the way she normally does. She reports she called out of work twice this week. She says she often feels "numb." She says numerous things make her anxious. She denies current suicidal ideation or history of suicide attempts. She says she doesn't care if something happens to her. She denies intentional self-injurious behavior. She denies current homicidal ideation or history of violence. She denies any psychotic symptoms. She denies alcohol or substance use.  Pt identifies several stressors. She says her son, who is her only child, will be graduating this year and Pt feels she will no longer be needed. She says she and her husband are experiencing marital problems. Pt's husband has stressful job as a [employer] fast streamer and they never seem to have time for each other. The patient states that she has recently reached out to a friend that she knew from years back and he seems to listen and be concerned about her. She told her husband about the situation today and about her feelings. Pt reports that her husband is willing to go to counseling. She  describes feeling alienated and says her husband doesn't really understand her depression. She says she is question whether she should stay with her husband because she feels he doesn't need her. 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 She has been working the same job for five years. Pt denies history of abuse or trauma.  Pt reports she is currently seeing Deatra Robinson for medication management. She says her psychiatrist has increased her Zoloft, she says she is currently taking 150 mg daily, but she still feels depressed and "flattened out." She says she has not seen a counselor before. Pt has no history of inpatient psychiatric treatment.  Pt is dressed in hospital scrubs, alert and oriented x4. Pt speaks in a clear tone, at moderate volume and normal pace. Motor behavior appears normal. Eye contact is good. Pt's mood is depressed and affect is flat. Thought process is coherent and relevant. There is no indication Pt is currently responding to internal stimuli or experiencing delusional thought content. Pt was cooperative throughout assessment. She says she is willing to sign voluntarily into a psychiatric facility.    Diagnosis:  F33.2 Major depressive disorder, Recurrent episode, Severe   Past Medical History:  Past Medical History:  Diagnosis Date  . Contact lens/glasses fitting    wears contacta or glasses  . Depression   . GERD (gastroesophageal reflux disease)   . Hypertension   . PONV (postoperative nausea and vomiting)   . Tear of medial meniscus of right knee 12/21/2013    Past Surgical History:  Procedure Laterality  Date  . CHOLECYSTECTOMY  2008   lap choli  . DILATION AND CURETTAGE OF UTERUS    . ERCP  2008  . KNEE ARTHROSCOPY WITH MEDIAL MENISECTOMY Left 12/21/2013   Procedure: LEFT KNEE ARTHROSCOPY WITH MEDIAL MENISECTOMY;  Surgeon: Eulas Post, MD;  Location: [facility];  Service: Orthopedics;  Laterality:  Left;  . TONSILLECTOMY    . VAGINAL HYSTERECTOMY  2012   loa    Family History: No family history on file.  Social History:  reports that she has never smoked. She has never used smokeless tobacco. She reports that she does not drink alcohol or use drugs.  Additional Social History:  Alcohol / Drug Use Pain Medications: See MAR Prescriptions: See MAR Over the Counter: See MAR History of alcohol / drug use?: No history of alcohol / drug abuse Longest period of sobriety (when/how long): NA  CIWA: CIWA-Ar BP: (!) 145/103 Pulse Rate: 73 COWS:    Allergies:  Allergies  Allergen Reactions  . Oxycodone Hives  . Sulfa Antibiotics Hives  . Tamiflu [Oseltamivir Phosphate] Hives    Home Medications:  (Not in a hospital admission)  OB/GYN Status:  No LMP recorded. Patient has had a hysterectomy.  General Assessment Data Location of Assessment: [facility] TTS Assessment: In system Is this a Tele or Face-to-Face Assessment?: Tele Assessment Is this an Initial Assessment or a Re-assessment for this encounter?: Initial Assessment Marital status: Married [place] name: NA Is patient pregnant?: No Pregnancy Status: No Living Arrangements: Spouse/significant other, Children(Husband and son) Can pt return to current living arrangement?: Yes Admission Status: Voluntary Is patient capable of signing voluntary admission?: Yes Referral Source: Self/Family/Friend Insurance type: BCBS     Crisis Care Plan Living Arrangements: Spouse/significant other, Children(Husband and son) Legal Guardian: Other:(Self) Name of Psychiatrist: Deatra Robinson Name of Therapist: None  Education Status Is patient currently in school?: No Is the patient employed, unemployed or receiving disability?: Employed  Risk to self with the past 6 months Suicidal Ideation: No Has patient been a risk to self within the past 6 months prior to admission? : No Suicidal Intent: No Has patient had any suicidal intent  within the past 6 months prior to admission? : No Is patient at risk for suicide?: No Suicidal Plan?: No Has patient had any suicidal plan within the past 6 months prior to admission? : No Access to Means: No What has been your use of drugs/alcohol within the last 12 months?: Pt denies Previous Attempts/Gestures: No How many times?: 0 Other Self Harm Risks: None Triggers for Past Attempts: None known Intentional Self Injurious Behavior: None Family Suicide History: No Recent stressful life event(s): Other (Comment)(Son graduating) Persecutory voices/beliefs?: No Depression: Yes Depression Symptoms: Despondent, Tearfulness, Isolating, Fatigue, Guilt, Loss of interest in usual pleasures, Feeling worthless/self pity, Feeling angry/irritable Substance abuse history and/or treatment for substance abuse?: No Suicide prevention information given to non-admitted patients: Not applicable  Risk to Others within the past 6 months Homicidal Ideation: No Does patient have any lifetime risk of violence toward others beyond the six months prior to admission? : No Thoughts of Harm to Others: No Current Homicidal Intent: No Current Homicidal Plan: No Access to Homicidal Means: No Identified Victim: None History of harm to others?: No Assessment of Violence: None Noted Violent Behavior Description: Pt denies history of violence Does patient have access to weapons?: No Criminal Charges Pending?: No Does patient have a court date: No Is patient on probation?: No  Psychosis Hallucinations:  None noted Delusions: None noted  Mental Status Report Appearance/Hygiene: In scrubs Eye Contact: Good Motor Activity: Unremarkable Speech: Logical/coherent Level of Consciousness: Alert Mood: Depressed, Anxious Affect: Flat Anxiety Level: Moderate Thought Processes: Coherent, Relevant Judgement: Partial Orientation: Person, Place, Time, Situation, Appropriate for developmental age Obsessive  Compulsive Thoughts/Behaviors: None  Cognitive Functioning Concentration: Decreased Memory: Recent Intact, Remote Intact Is patient IDD: No Is patient DD?: No Insight: Fair Impulse Control: Fair Appetite: Fair Have you had any weight changes? : No Change Sleep: Decreased Total Hours of Sleep: 3 Vegetative Symptoms: Staying in bed, Decreased grooming  ADLScreening [facility] Assessment Services) Patient's cognitive ability adequate to safely complete daily activities?: Yes Patient able to express need for assistance with ADLs?: Yes Independently performs ADLs?: Yes (appropriate for developmental age)  Prior Inpatient Therapy Prior Inpatient Therapy: No  Prior Outpatient Therapy Prior Outpatient Therapy: Yes Prior Therapy Dates: Current Prior Therapy Facilty/Provider(s): [facility] Reason for Treatment: Depression Does patient have an ACCT team?: No Does patient have Intensive In-House Services?  : No Does patient have [facility] services? : No Does patient have P4CC services?: No  ADL Screening (condition at time of admission) Patient's cognitive ability adequate to safely complete daily activities?: Yes Is the patient deaf or have difficulty hearing?: No Does the patient have difficulty seeing, even when wearing glasses/contacts?: No Does the patient have difficulty concentrating, remembering, or making decisions?: No Patient able to express need for assistance with ADLs?: Yes Does the patient have difficulty dressing or bathing?: No Independently performs ADLs?: Yes (appropriate for developmental age) Does the patient have difficulty walking or climbing stairs?: No Weakness of Legs: None Weakness of Arms/Hands: None  Home Assistive Devices/Equipment Home Assistive Devices/Equipment: Eyeglasses    Abuse/Neglect Assessment (Assessment to be complete while patient is alone) Abuse/Neglect Assessment Can Be Completed: Yes Physical Abuse: Denies Verbal Abuse: Denies Sexual  Abuse: Denies Exploitation of patient/patient's resources: Denies Self-Neglect: Denies     Merchant navy officerAdvance Directives (For Healthcare) Does Patient Have a Medical Advance Directive?: No Would patient like information on creating a medical advance directive?: No - Patient declined          Disposition: [name] bed availability. Gave clinical report to Nira ConnJason Berry, NP who said Pt meets criteria for inpatient psychiatric crisis stabilization. Pt is accepted to the service of Dr. Carmon GinsbergF. Cobos, room 405-1. Notified Dr. Devoria AlbeIva Knapp and Nehemiah SettleBrooke, RN of acceptance.  Disposition Initial Assessment Completed for this Encounter: Yes  This service was provided via telemedicine using a 2-way, interactive audio and video technology.  Names of all persons participating in this telemedicine service and their role in this encounter. Name: Berna SpareJulie M GNFAOZSladky Role: Patient  Name: Shela CommonsFord Faolan [place] Jr, [place] Role: TTS counselor         Harlin RainFord Ellis Patsy BaltimoreWarrick Jr, [facility], [facility], [facility] Triage Specialist [phone]  Patsy BaltimoreWarrick Jr, Harlin RainFord Ellis 10/30/2017 1:08 AM

## 2017-10-30 NOTE — H&P (Signed)
Psychiatric Admission Assessment Adult  Patient Identification: [name] MRN:  [id number] Date of Evaluation:  10/30/2017 Chief Complaint:  mdd recurrent severe Principal Diagnosis: <principal problem not specified> Diagnosis:   Patient Active Problem List   Diagnosis Date Noted  . Severe recurrent major depression without psychotic features (Malcolm) [F33.2] 10/30/2017  . Hypersomnia [G47.10] 04/16/2016  . Morbid obesity ([place]) [E66.01] 04/16/2016  . Tear of medial meniscus of left knee [S83.242A] 12/21/2013   History of Present Illness: Patient is seen and examined.  Patient is a 47 year old female with past psychiatric history significant for depression and anxiety who presented to the [facility] with suicidal ideation.  The patient stated at that time that she felt as though her mood was "bottoming out".  She also stated that she had hit "rock bottom".  Patient has been treated for depression anxiety for at least 10 years.  She has been treated with Zoloft over those years, and her dosage was increased 150 mg over the years.  She felt as though earlier this year that the medications were not effective, and spoke to her psychiatrist about this.  They attempted Wellbutrin augmentation, but that was not effective and the patient self stopped it.  She cited psychosocial stressors as the major issue going on in her life.  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  She is considered leaving teaching these children, but her principal continues to request that she continue.  She also felt as though her husband was not attending to their relationship, and she contacted an old female friend, and they have initiated a relationship.  Also her teenage son is getting ready to graduate from high school and she is fearful that he no longer needs her.  She admitted to helplessness, hopelessness and worthlessness.  She admitted to suicidal ideation.  She admitted to tearfulness.   She admitted to poor sleep.  She was admitted to the hospital for evaluation and stabilization. Associated Signs/Symptoms: Depression Symptoms:  depressed mood, anhedonia, insomnia, psychomotor agitation, fatigue, feelings of worthlessness/guilt, difficulty concentrating, hopelessness, suicidal thoughts without plan, anxiety, loss of energy/fatigue, disturbed sleep, (Hypo) Manic Symptoms:  Impulsivity, Anxiety Symptoms:  Excessive Worry, Psychotic Symptoms:  Patient denied PTSD Symptoms: Negative Total Time spent with patient: 45 minutes  Past Psychiatric History: Patient stated she had not been hospitalized previously.  She has been treated for anxiety and depression with sertraline as well as alprazolam.  She had also previously been given Wellbutrin as an augmentation agent, but that had not been successful.  Outpatient psychiatrist.  Is the patient at risk to self? Yes.    Has the patient been a risk to self in the past 6 months? Yes.    Has the patient been a risk to self within the distant past? No.  Is the patient a risk to others? No.  Has the patient been a risk to others in the past 6 months? No.  Has the patient been a risk to others within the distant past? No.   Prior Inpatient Therapy:   Prior Outpatient Therapy:    Alcohol Screening: 1. How often do you have a drink containing alcohol?: Never 2. How many drinks containing alcohol do you have on a typical day when you are drinking?: 1 or 2 3. How often do you have six or more drinks on one occasion?: Never AUDIT-C Score: 0 4. How often during the last year have you found that you were not able  to stop drinking once you had started?: Never 5. How often during the last year have you failed to do what was normally expected from you becasue of drinking?: Never 6. How often during the last year have you needed a first drink in the morning to get yourself going after a heavy drinking session?: Never 7. How often during  the last year have you had a feeling of guilt of remorse after drinking?: Never 8. How often during the last year have you been unable to remember what happened the night before because you had been drinking?: Never 9. Have you or someone else been injured as a result of your drinking?: No 10. Has a relative or friend or a doctor or another health worker been concerned about your drinking or suggested you cut down?: No Alcohol Use Disorder Identification Test Final Score (AUDIT): 0 Intervention/Follow-up: Brief Advice Substance Abuse History in the last 12 months:  No. Consequences of Substance Abuse: Negative Previous Psychotropic Medications: Yes  Psychological Evaluations: Yes  Past Medical History:  Past Medical History:  Diagnosis Date  . Contact lens/glasses fitting    wears contacta or glasses  . Depression   . GERD (gastroesophageal reflux disease)   . Hypertension   . PONV (postoperative nausea and vomiting)   . Tear of medial meniscus of right knee 12/21/2013    Past Surgical History:  Procedure Laterality Date  . CHOLECYSTECTOMY  2008   lap choli  . DILATION AND CURETTAGE OF UTERUS    . ERCP  2008  . KNEE ARTHROSCOPY WITH MEDIAL MENISECTOMY Left 12/21/2013   Procedure: LEFT KNEE ARTHROSCOPY WITH MEDIAL MENISECTOMY;  Surgeon: Johnny Bridge, MD;  Location: [facility];  Service: Orthopedics;  Laterality: Left;  . TONSILLECTOMY    . VAGINAL HYSTERECTOMY  2012   loa   Family History: History reviewed. No pertinent family history. Family Psychiatric  History: Mother with depression and anxiety. Tobacco Screening:   Social History:  Social History   Substance and Sexual Activity  Alcohol Use No     Social History   Substance and Sexual Activity  Drug Use No    Additional Social History:                           Allergies:   Allergies  Allergen Reactions  . Oxycodone Hives  . Sulfa Antibiotics Hives  . Tamiflu [Oseltamivir Phosphate]  Hives   Lab Results:  Results for orders placed or performed during the hospital encounter of 10/29/17 (from the past 48 hour(s))  Comprehensive metabolic panel     Status: Abnormal   Collection Time: 10/29/17 11:15 PM  Result Value Ref Range   Sodium 137 135 - 145 mmol/L   Potassium 3.8 3.5 - 5.1 mmol/L   Chloride 104 101 - 111 mmol/L   CO2 23 22 - 32 mmol/L   Glucose, Bld 123 (H) 65 - 99 mg/dL   BUN 11 6 - 20 mg/dL   Creatinine, Ser 0.65 0.44 - 1.00 mg/dL   Calcium 9.4 8.9 - 10.3 mg/dL   Total Protein 7.2 6.5 - 8.1 g/dL   Albumin 4.0 3.5 - 5.0 g/dL   AST 19 15 - 41 U/L   ALT 19 14 - 54 U/L   Alkaline Phosphatase 52 38 - 126 U/L   Total Bilirubin 0.5 0.3 - 1.2 mg/dL   GFR calc non Af Amer >60 >60 mL/min   GFR calc Af Amer >  60 >60 mL/min    Comment: (NOTE) The eGFR has been calculated using the CKD EPI equation. This calculation has not been validated in all clinical situations. eGFR's persistently <60 mL/min signify possible Chronic Kidney Disease.    Anion gap 10 5 - 15    Comment: Performed at [facility], [address] [place], [place] [address]  Ethanol     Status: None   Collection Time: 10/29/17 11:15 PM  Result Value Ref Range   Alcohol, Ethyl (B) <10 <10 mg/dL    Comment:        LOWEST DETECTABLE LIMIT FOR SERUM ALCOHOL IS 10 mg/dL FOR MEDICAL PURPOSES ONLY Performed at [facility], [address], [place], [place] [address]   CBC with Diff     Status: None   Collection Time: 10/29/17 11:15 PM  Result Value Ref Range   WBC 10.0 4.0 - 10.5 K/uL   RBC 4.58 3.87 - 5.11 MIL/uL   Hemoglobin 13.4 12.0 - 15.0 g/dL   HCT 40.8 36.0 - 46.0 %   MCV 89.1 78.0 - 100.0 fL   MCH 29.3 26.0 - 34.0 pg   MCHC 32.8 30.0 - 36.0 g/dL   RDW 14.2 11.5 - 15.5 %   Platelets 360 150 - 400 K/uL   Neutrophils Relative % 52 %   Neutro Abs 5.1 1.7 - 7.7 K/uL   Lymphocytes Relative 38 %   Lymphs Abs 3.9 0.7 - 4.0 K/uL   Monocytes Relative 8 %   Monocytes Absolute 0.8 0.1 - 1.0  K/uL   Eosinophils Relative 2 %   Eosinophils Absolute 0.2 0.0 - 0.7 K/uL   Basophils Relative 0 %   Basophils Absolute 0.0 0.0 - 0.1 K/uL    Comment: Performed at [facility], [address], [place], [place] [address]  I-Stat beta hCG blood, ED     Status: None   Collection Time: 10/29/17 11:23 PM  Result Value Ref Range   I-stat hCG, quantitative <5.0 <5 mIU/mL   Comment 3            Comment:   GEST. AGE      CONC.  (mIU/mL)   <=1 WEEK        5 - 50     2 WEEKS       50 - 500     3 WEEKS       100 - 10,000     4 WEEKS     1,000 - 30,000        FEMALE AND NON-PREGNANT FEMALE:     LESS THAN 5 mIU/mL   Urine rapid drug screen (hosp performed)     Status: Abnormal   Collection Time: 10/29/17 11:27 PM  Result Value Ref Range   Opiates NONE DETECTED NONE DETECTED   Cocaine NONE DETECTED NONE DETECTED   Benzodiazepines POSITIVE (A) NONE DETECTED   Amphetamines NONE DETECTED NONE DETECTED   Tetrahydrocannabinol NONE DETECTED NONE DETECTED   Barbiturates NONE DETECTED NONE DETECTED    Comment: (NOTE) DRUG SCREEN FOR MEDICAL PURPOSES ONLY.  IF CONFIRMATION IS NEEDED FOR ANY PURPOSE, NOTIFY LAB WITHIN 5 DAYS. LOWEST DETECTABLE LIMITS FOR URINE DRUG SCREEN Drug Class                     Cutoff (ng/mL) Amphetamine and metabolites    1000 Barbiturate and metabolites    200 Benzodiazepine  562 Tricyclics and metabolites     300 Opiates and metabolites        300 Cocaine and metabolites        300 THC                            50 Performed at [facility], [address], [place], [place] [address]     Blood Alcohol level:  Lab Results  Component Value Date   ETH <10 37/34/2876    Metabolic Disorder Labs:  No results found for: HGBA1C, MPG No results found for: PROLACTIN No results found for: CHOL, TRIG, HDL, CHOLHDL, VLDL, LDLCALC  Current Medications: Current Facility-Administered Medications  Medication Dose Route Frequency Provider Last Rate Last Dose   . acetaminophen (TYLENOL) tablet 650 mg  650 mg Oral Q6H PRN Lindon Romp A, NP      . albuterol (PROVENTIL HFA;VENTOLIN HFA) 108 (90 Base) MCG/ACT inhaler 1-2 puff  1-2 puff Inhalation Q4H PRN Lindon Romp A, NP      . ALPRAZolam Duanne Moron) tablet 0.5 mg  0.5 mg Oral Daily PRN Lindon Romp A, NP      . ALPRAZolam Duanne Moron) tablet 0.5 mg  0.5 mg Oral QHS PRN Sharma Covert, MD      . alum & mag hydroxide-simeth (MAALOX/MYLANTA) 200-200-20 MG/5ML suspension 30 mL  30 mL Oral Q4H PRN Lindon Romp A, NP      . hydrOXYzine (ATARAX/VISTARIL) tablet 25 mg  25 mg Oral TID PRN Lindon Romp A, NP   25 mg at 10/30/17 0748  . ibuprofen (ADVIL,MOTRIN) tablet 600 mg  600 mg Oral Q6H PRN Lindon Romp A, NP      . lisinopril (PRINIVIL,ZESTRIL) tablet 20 mg  20 mg Oral QPM Lindon Romp A, NP      . magnesium hydroxide (MILK OF MAGNESIA) suspension 30 mL  30 mL Oral Daily PRN Lindon Romp A, NP      . pantoprazole (PROTONIX) EC tablet 40 mg  40 mg Oral Daily Lindon Romp A, NP   40 mg at 10/30/17 0743  . [START ON 10/31/2017] sertraline (ZOLOFT) tablet 100 mg  100 mg Oral Daily Sharma Covert, MD      . traZODone (DESYREL) tablet 50 mg  50 mg Oral QHS PRN Rozetta Nunnery, NP       PTA Medications: Medications Prior to Admission  Medication Sig Dispense Refill Last Dose  . ALPRAZolam (XANAX) 0.5 MG tablet Take 1 tablet by mouth daily as needed for anxiety.    10/29/2017 at Unknown time  . esomeprazole (NEXIUM) 40 MG capsule Take 40-80 mg by mouth daily as needed (for acid reflux).    10/29/2017 at Unknown time  . ibuprofen (ADVIL,MOTRIN) 200 MG tablet Take 600 mg by mouth every 6 (six) hours as needed for mild pain or moderate pain.   Past Week at Unknown time  . lisinopril (PRINIVIL,ZESTRIL) 20 MG tablet Take 20 mg by mouth every evening.    10/28/2017 at Unknown time  . PROAIR HFA 108 (90 Base) MCG/ACT inhaler INHALE 2 PUFFS BY MOUTH EVERY 6 HOURS AS NEEDED FOR SHORTNESS OF BREATH  0 UNKNOWN  . sertraline  (ZOLOFT) 100 MG tablet Take 150 mg by mouth daily.    10/29/2017 at Unknown time    Musculoskeletal: Strength & Muscle Tone: within normal limits Gait & Station: normal Patient leans: Right  Psychiatric Specialty Exam: Physical Exam  Nursing note and vitals  reviewed. Constitutional: She is oriented to person, place, and time. She appears well-developed.  HENT:  Head: Normocephalic and atraumatic.  Respiratory: Effort normal.  Musculoskeletal: Normal range of motion.  Neurological: She is alert and oriented to person, place, and time.    ROS  Blood pressure 137/86, pulse 96, temperature 98.2 F (36.8 C), temperature source Oral, resp. rate 17, height _0  (1.6 m), weight 112.9 kg (249 lb).Body mass index is 44.11 kg/m.  General Appearance: Disheveled  Eye Contact:  Fair  Speech:  Normal Rate  Volume:  Normal  Mood:  Anxious  Affect:  Congruent  Thought Process:  Coherent  Orientation:  Full (Time, Place, and Person)  Thought Content:  Logical  Suicidal Thoughts:  Yes.  without intent/plan  Homicidal Thoughts:  No  Memory:  Immediate;   Fair  Judgement:  Intact  Insight:  Fair  Psychomotor Activity:  Increased  Concentration:  Concentration: Fair  Recall:  Kendall of Knowledge:  Good  Language:  Good  Akathisia:  No  Handed:  Right  AIMS (if indicated):     Assets:  Communication Skills Desire for Improvement Financial Resources/Insurance Housing Physical Health Resilience Social Support Vocational/Educational  ADL's:  Intact  Cognition:  WNL  Sleep:  Number of Hours: 1.25    Treatment Plan Summary: Daily contact with patient to assess and evaluate symptoms and progress in treatment, Medication management and Plan Patient is seen and examined.  Patient is a 47 year old female with the above-stated past psychiatric history is seen on admission secondary to worsening depression and suicidal ideation.  She will be admitted to the hospital.  She will be  integrated into the milieu.  She will be recommended to attend groups and to receive individual consultation with social work.  From a medication standpoint we will begin weaning her off the Zoloft.  We will cut her dose to 100 mg a day, 50 mg tomorrow, and substitute another antidepressant agent on Monday or Tuesday.  She has been taking Xanax for multiple years, and is only been taking it at bedtime.  I am going to write for that to be available at bedtime.  Most likely given the marital issues to be good to have a family meeting prior to discharge.  With regard to her medications she is not really been on anything outside of the Zoloft.  Another SSRI or SNRI would be available to substitute.  She will continue on 15-minute checks for safety hopefully we will be able to turn her around rather quickly.  Observation Level/Precautions:  15 minute checks  Laboratory:  Chemistry Profile  Psychotherapy:    Medications:    Consultations:    Discharge Concerns:    Estimated LOS:  Other:     Physician Treatment Plan for Primary Diagnosis: <principal problem not specified> Long Term Goal(s): Improvement in symptoms so as ready for discharge  Short Term Goals: Ability to identify changes in lifestyle to reduce recurrence of condition will improve, Ability to verbalize feelings will improve, Ability to disclose and discuss suicidal ideas, Ability to demonstrate self-control will improve, Ability to identify and develop effective coping behaviors will improve, Ability to maintain clinical measurements within normal limits will improve and Compliance with prescribed medications will improve  Physician Treatment Plan for Secondary Diagnosis: Active Problems:   Severe recurrent major depression without psychotic features (Bradley)  Long Term Goal(s): Improvement in symptoms so as ready for discharge  Short Term Goals: Ability to identify changes in lifestyle to  reduce recurrence of condition will improve, Ability  to verbalize feelings will improve, Ability to disclose and discuss suicidal ideas, Ability to demonstrate self-control will improve, Ability to identify and develop effective coping behaviors will improve, Ability to maintain clinical measurements within normal limits will improve and Compliance with prescribed medications will improve  I certify that inpatient services furnished can reasonably be expected to improve the patient's condition.    Sharma Covert, MD 4/14/201911:28 AM

## 2017-10-30 NOTE — Progress Notes (Signed)
Patient presents with flat/empty affect and behavior during admission interview and assessment. VS monitored and recorded. Skin check performed with Thayer Ohmhris MHT and revealed skin clean, dry, and intact. Contraband was not found. Patient was oriented to unit and schedule. Pt states "Recently, I feel so flat. That is really why I am here. I have no plans to kill myself. I just want to change this feeling. I hate feeling worthless and I don't enjoy life like I used to.". Pt denies SI/HI/AVH at this time. PO fluids provided. Safety maintained. Rest encouraged.

## 2017-10-30 NOTE — [facility] Counselor (Signed)
Adult Comprehensive Assessment  Patient ID: [name], female   DOB: 1970-07-23, 47 y.o.   MRN: [id number]  Information Source: Information source: Patient  Current Stressors:  Employment / Job issues: job stress Family Relationships: only child is graduating from high school.  empty nest fears.  Marital issues.  Living/Environment/Situation:  Living Arrangements: Children, Spouse/significant other Living conditions (as described by patient or guardian): goes OK How long has patient lived in current situation?: 10 years What is atmosphere in current home: Comfortable  Family History:  Marital status: Married Number of Years Married: 23 What types of issues is patient dealing with in the relationship?: Pt's husband has not been very supportive to pt about her depression.   Are you sexually active?: Yes What is your sexual orientation?: heterosexual Has your sexual activity been affected by drugs, alcohol, medication, or emotional stress?: decreased Does patient have children?: Yes How many children?: 1 How is patient's relationship with their children?: good relationship  Childhood History:  By whom was/is the patient raised?: Both parents Additional childhood history information: Parents remained married.  Good childhood: normal, no significant problems. Description of patient's relationship with caregiver when they were a child: mom: good, dad: good Patient's description of current relationship with people who raised him/her: mom: better recently, dad: better recently.  They have not been terribly supportive with pt depression. How were you disciplined when you got in trouble as a child/adolescent?: appropriate discipline Does patient have siblings?: Yes Number of Siblings: 1 Description of patient's current relationship with siblings: younger sister: not close, but they get along Did patient suffer any verbal/emotional/physical/sexual abuse as a child?: No Did patient  suffer from severe childhood neglect?: No Has patient ever been sexually abused/assaulted/raped as an adolescent or adult?: No Was the patient ever a victim of a crime or a disaster?: No Witnessed domestic violence?: No Has patient been effected by domestic violence as an adult?: No  Education:  Highest grade of school patient has completed: Manufacturing engineer, special education Currently a student?: No Learning disability?: No  Employment/Work Situation:   Employment situation: Employed 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 Are These Weapons Safely Secured?: Yes  Financial Resources:   Financial resources: Income from employment, Income from spouse, Private insurance Does patient have a representative payee or guardian?: No  Alcohol/Substance Abuse:   What has been your use of drugs/alcohol within the last 12 months?: Pt denies any alcohol or drug use. If attempted suicide, did drugs/alcohol play a role in this?: No Alcohol/Substance Abuse Treatment Hx: Denies past history Has alcohol/substance abuse ever caused legal problems?: No  Social Support System:   Patient's Community Support System: Good Describe Community Support System: husband, son, parents, in laws Type of faith/religion: Ephriam Knuckles How does patient's faith help to cope with current illness?: faith helps her cope  Leisure/Recreation:   Leisure and Hobbies: play with dogs, shop, Materials engineer  Strengths/Needs:   What things does the patient do well?: non-judgemental, loyal, caring,  funny In what areas does patient struggle / problems for patient: focus, organization, motivation  Discharge Plan:   Does patient have access to transportation?: Yes Will patient be returning to same living situation after discharge?: Yes Currently receiving community mental health services: Yes (From [facility])(Karen Jones, psychiatrist, [facility]) Does patient have financial barriers related to discharge medications?: No  Summary/Recommendations:   Summary and Recommendations (to be completed by the evaluator): Pt is 47 year old female from [place]. [facility]  Pt is diagnosed with major depressive disorder and was admitted due to increased depression.  Pt reports stressors including her only son's graduation from high school and marital issues.  Recommendations for pt include crisis stabilization, therapeutic milieu, attend and participate in groups, medication management, and development of comrehensive mental wellness plan.  Lorri FrederickWierda, Tailyn Hantz Jon. 10/30/2017

## 2017-10-30 NOTE — [facility] Suicide Risk Assessment (Signed)
[name] Admission Suicide Risk Assessment   Nursing information obtained from:    Demographic factors:    Current Mental Status:    Loss Factors:    Historical Factors:    Risk Reduction Factors:     Total Time spent with patient: 45 minutes Principal Problem: <principal problem not specified> Diagnosis:   Patient Active Problem List   Diagnosis Date Noted  . Severe recurrent major depression without psychotic features (HCC) [F33.2] 10/30/2017  . Hypersomnia [G47.10] 04/16/2016  . Morbid obesity (HCC) [E66.01] 04/16/2016  . Tear of medial meniscus of left knee [S83.242A] 12/21/2013   Subjective Data: Patient is seen and examined.  Patient's a 47 year old female with a past psychiatric history significant for depression and anxiety who presented to the [facility] emergency department yesterday with suicidal ideation.  The patient stated that she had increased depression.  She had increased helplessness, hopelessness or worthlessness.  She was having suicidal ideation.  Patient stated there were several external stressors that had led to this.  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  She did not really want to do this because it took such a toll on her emotional state.  She stated she is also dissatisfied in her relationship with her husband.  He is in [employer] examiner, and was much more caught up with it than their relationship.  She began a relationship with an old friend.  This was a female friend.  This led to increased stress and distress on her.  She has been treated for depression for the last 10 years.  She had been on Zoloft for many years.  Her depression and had worsened earlier this year, and the psychiatrist attempted to augment the Zoloft with Wellbutrin.  This did not work, and the patient self stopped it.  Things just began to build up, and finally yesterday patient was  unable to cope with the stressors, and went to the emergency room for assistance.  She was admitted to the [facility] for evaluation and stabilization.  Continued Clinical Symptoms:  Alcohol Use Disorder Identification Test Final Score (AUDIT): 0 The "Alcohol Use Disorders Identification Test", Guidelines for Use in Primary Care, Second Edition.  World Science writer [name]). Score between 0-7:  no or low risk or alcohol related problems. Score between 8-15:  moderate risk of alcohol related problems. Score between 16-19:  high risk of alcohol related problems. Score 20 or above:  warrants further diagnostic evaluation for alcohol dependence and treatment.   CLINICAL FACTORS:   Depression:   Anhedonia Hopelessness Impulsivity Insomnia   Musculoskeletal: Strength & Muscle Tone: within normal limits Gait & Station: normal Patient leans: N/A  Psychiatric Specialty Exam: Physical Exam  Nursing note and vitals reviewed. Constitutional: She appears well-developed and well-nourished.  HENT:  Head: Normocephalic and atraumatic.  Respiratory: Effort normal.  Musculoskeletal: Normal range of motion.    ROS  Blood pressure 137/86, pulse 96, temperature 98.2 F (36.8 C), temperature source Oral, resp. rate 17, height 5\' 3"  (1.6 m), weight 112.9 kg (249 lb).Body mass index is 44.11 kg/m.  General Appearance: Disheveled  Eye Contact:  Minimal  Speech:  Slow  Volume:  Decreased  Mood:  Depressed  Affect:  Congruent  Thought Process:  Coherent  Orientation:  Full (Time, Place, and Person)  Thought Content:  Logical  Suicidal Thoughts:  Yes.  without intent/plan  Homicidal Thoughts:  No  Memory:  Immediate;   Fair  Judgement:  Impaired  Insight:  Fair  Psychomotor Activity:  Psychomotor Retardation  Concentration:  Concentration: Fair  Recall:  [employer]  Fund of Knowledge:  Fair  Language:  Fair  Akathisia:  No  Handed:  Right  AIMS (if indicated):     Assets:  Communication  Skills Desire for Improvement Financial Resources/Insurance Housing Physical Health Resilience Vocational/Educational  ADL's:  Intact  Cognition:  WNL  Sleep:  Number of Hours: 1.25      COGNITIVE FEATURES THAT CONTRIBUTE TO RISK:  None    SUICIDE RISK:   Mild:  Suicidal ideation of limited frequency, intensity, duration, and specificity.  There are no identifiable plans, no associated intent, mild dysphoria and related symptoms, good self-control (both objective and subjective assessment), few other risk factors, and identifiable protective factors, including available and accessible social support.  PLAN OF CARE: Patient is seen and examined.  Patient is a 47 year old female with the above-stated past psychiatric history was admitted due to suicidal ideation and worsening depression.  We will wean her off of the Zoloft.  We will decrease her dosage to 100 mg a day today, then reduce it to 50 mg a day tomorrow, and then stop it the day after.  We will substitute a new antidepressant at that time.  She is not been treated with any other SSRI so those are options, but I think venlafaxine would be a better choice at this point.  We will see how she does coming off of the Zoloft.  She has been on Xanax for many years, and stated that she only takes that at night.  I reviewed the database, and she is been getting it for an extended period of time.  I will change the order to once a day as needed for anxiety, and at bedtime as needed for insomnia.  We will collect collateral information from her family to confirm her issues.  She will be admitted to the unit, and integrated into the milieu.  She will be encouraged to attend groups.  We will continue her lisinopril for her hypertension.  I certify that inpatient services furnished can reasonably be expected to improve the patient's condition.   Renee PertGreg Lawson Aira Sallade, MD 10/30/2017, 9:24 AM

## 2017-10-30 NOTE — Plan of Care (Signed)
Nurse discussed anxiety, depression, coping skills with patient. 

## 2017-10-30 NOTE — Progress Notes (Signed)
Patient denied SI and HI, contracts for safety.  Denied A/V hallucinations.  Denied pain. Medications administered per MD orders.  Emotional support and encouragement given patient. Safety maintained with 15 minute checks.   

## 2017-10-30 NOTE — [facility] Group Notes (Signed)
Identifying Needs  Date:  10/30/2017  Time:  3:41 PM  Type of Therapy:  Nurse Education  /  The group focuses on teaching patients now to identify their needs and how to develop skills needed to get those needs met.  Participation Level:  Active  Participation Quality:  Attentive  Affect:  Appropriate  Cognitive:  Alert  Insight:  Appropriate  Engagement in Group:  Engaged  Modes of Intervention:  Education  Summary of Progress/Problems:  Lauralyn Primes 10/30/2017, 3:41 PM

## 2017-10-30 NOTE — ED Notes (Signed)
Pt having tts consult at this time.  

## 2017-10-31 LAB — LIPID PANEL
Cholesterol: 230 mg/dL — ABNORMAL HIGH (ref 0–200)
HDL: 75 mg/dL (ref >40)
LDL CALC: 123 mg/dL — AB (ref 0–99)
TRIGLYCERIDES: 162 mg/dL — AB (ref <150)
Total CHOL/HDL Ratio: 3.1 RATIO
VLDL: 32 mg/dL (ref 0–40)

## 2017-10-31 LAB — HEMOGLOBIN A1C
Hgb A1c MFr Bld: 5.1 % (ref 4.8–5.6)
MEAN PLASMA GLUCOSE: 99.67 mg/dL

## 2017-10-31 LAB — TSH: TSH: 4.165 u[IU]/mL (ref 0.350–4.500)

## 2017-10-31 NOTE — [facility] Suicide Risk Assessment (Signed)
[facility] INPATIENT:  Family/Significant Other Suicide Prevention Education  Suicide Prevention Education:  Education Completed; No one has been identified by the patient as the family member/significant other with whom the patient will be residing, and identified as the person(s) who will aid the patient in the event of a mental health crisis (suicidal ideations/suicide attempt).  With written consent from the patient, the family member/significant other has been provided the following suicide prevention education, prior to the and/or following the discharge of the patient.  The suicide prevention education provided includes the following:  Suicide risk factors  Suicide prevention and interventions  National Suicide Hotline telephone number  [facility] assessment telephone number  [facility] Emergency Assistance 911  [facility] and/or Residential Mobile Crisis Unit telephone number  Request made of family/significant other to:  Remove weapons (e.g., guns, rifles, knives), all items previously/currently identified as safety concern.    Remove drugs/medications (over-the-counter, prescriptions, illicit drugs), all items previously/currently identified as a safety concern.  The family member/significant other verbalizes understanding of the suicide prevention education information provided.  The family member/significant other agrees to remove the items of safety concern listed above. The patient did not endorse SI at the time of admission, nor did the patient c/o SI during the stay here.  SPE not required.   [name] 10/31/2017, 8:55 AM

## 2017-10-31 NOTE — Progress Notes (Signed)
[facility] MD Progress Note  10/31/2017 3:04 PM LEVERN KALKA  MRN:  [id number] Subjective: Patient is seen and examined.  Patient is a 47 year old female with a past psychiatric history significant for major depression who was seen in follow-up.  She was seen in treatment team today with nursing as well as social work.  She states she was doing well.  She wanted to go home today.  We discussed the fact that we had begun to wean her off her medications as well as that she just been in the hospital for 1 day after expressing suicidal ideation.  She stated that everything was "great" with her husband.  I told the patient that we have to confer with the husband and discussed the situation and make sure that she was safe before we return to her home in the same environment that led to her suicidal thoughts.  I did tell her that since she was probably going to be discharged tomorrow that I felt uncomfortable changing her medications that I felt that she needed to have her outpatient psychiatrist continue to wean her off the Zoloft and change medicines if necessary.  She stated she had been on a higher dose of Zoloft in the past but had side effects to it.  She denied current suicidality. Principal Problem: <principal problem not specified> Diagnosis:   Patient Active Problem List   Diagnosis Date Noted  . Severe recurrent major depression without psychotic features (HCC) [F33.2] 10/30/2017  . Hypersomnia [G47.10] 04/16/2016  . Morbid obesity (HCC) [E66.01] 04/16/2016  . Tear of medial meniscus of left knee [S83.242A] 12/21/2013   Total Time spent with patient: 20 minutes  Past Psychiatric History: See admission H&P  Past Medical History:  Past Medical History:  Diagnosis Date  . Contact lens/glasses fitting    wears contacta or glasses  . Depression   . GERD (gastroesophageal reflux disease)   . Hypertension   . PONV (postoperative nausea and vomiting)   . Tear of medial meniscus of right knee  12/21/2013    Past Surgical History:  Procedure Laterality Date  . CHOLECYSTECTOMY  2008   lap choli  . DILATION AND CURETTAGE OF UTERUS    . ERCP  2008  . KNEE ARTHROSCOPY WITH MEDIAL MENISECTOMY Left 12/21/2013   Procedure: LEFT KNEE ARTHROSCOPY WITH MEDIAL MENISECTOMY;  Surgeon: Eulas Post, MD;  Location: [facility];  Service: Orthopedics;  Laterality: Left;  . TONSILLECTOMY    . VAGINAL HYSTERECTOMY  2012   loa   Family History: History reviewed. No pertinent family history. Family Psychiatric  History: See admission H&P Social History:  Social History   Substance and Sexual Activity  Alcohol Use No     Social History   Substance and Sexual Activity  Drug Use No    Social History   Socioeconomic History  . Marital status: Married    Spouse name: Not on file  . Number of children: Not on file  . Years of education: Not on file  . Highest education level: Not on file  Occupational History  . Not on file  Social Needs  . Financial resource strain: Not on file  . Food insecurity:    Worry: Not on file    Inability: Not on file  . Transportation needs:    Medical: Not on file    Non-medical: Not on file  Tobacco Use  . Smoking status: Never Smoker  . Smokeless tobacco: Never Used  Substance and Sexual  Activity  . Alcohol use: No  . Drug use: No  . Sexual activity: Not on file  Lifestyle  . Physical activity:    Days per week: Not on file    Minutes per session: Not on file  . Stress: Not on file  Relationships  . Social connections:    Talks on phone: Not on file    Gets together: Not on file    Attends religious service: Not on file    Active member of club or organization: Not on file    Attends meetings of clubs or organizations: Not on file    Relationship status: Not on file  Other Topics Concern  . Not on file  Social History Narrative  . Not on file   Additional Social History:                         Sleep:  Good  Appetite:  Good  Current Medications: Current Facility-Administered Medications  Medication Dose Route Frequency Provider Last Rate Last Dose  . acetaminophen (TYLENOL) tablet 650 mg  650 mg Oral Q6H PRN Nira Conn A, NP      . albuterol (PROVENTIL HFA;VENTOLIN HFA) 108 (90 Base) MCG/ACT inhaler 1-2 puff  1-2 puff Inhalation Q4H PRN Nira Conn A, NP      . ALPRAZolam Prudy Feeler) tablet 0.5 mg  0.5 mg Oral Daily PRN Nira Conn A, NP      . ALPRAZolam Prudy Feeler) tablet 0.5 mg  0.5 mg Oral QHS PRN Antonieta Pert, MD      . alum & mag hydroxide-simeth (MAALOX/MYLANTA) 200-200-20 MG/5ML suspension 30 mL  30 mL Oral Q4H PRN Nira Conn A, NP      . hydrOXYzine (ATARAX/VISTARIL) tablet 25 mg  25 mg Oral TID PRN Jackelyn Poling, NP   25 mg at 10/30/17 1849  . ibuprofen (ADVIL,MOTRIN) tablet 600 mg  600 mg Oral Q6H PRN Nira Conn A, NP      . lisinopril (PRINIVIL,ZESTRIL) tablet 20 mg  20 mg Oral QPM Nira Conn A, NP   20 mg at 10/30/17 1848  . magnesium hydroxide (MILK OF MAGNESIA) suspension 30 mL  30 mL Oral Daily PRN Nira Conn A, NP      . pantoprazole (PROTONIX) EC tablet 40 mg  40 mg Oral Daily Nira Conn A, NP   40 mg at 10/31/17 0814  . sertraline (ZOLOFT) tablet 100 mg  100 mg Oral Daily Antonieta Pert, MD   100 mg at 10/31/17 1610  . traZODone (DESYREL) tablet 50 mg  50 mg Oral QHS PRN Jackelyn Poling, NP        Lab Results:  Results for orders placed or performed during the hospital encounter of 10/30/17 (from the past 48 hour(s))  Hemoglobin A1c     Status: None   Collection Time: 10/31/17  6:29 AM  Result Value Ref Range   Hgb A1c MFr Bld 5.1 4.8 - 5.6 %    Comment: (NOTE) Pre diabetes:          5.7%-6.4% Diabetes:              >6.4% Glycemic control for   <7.0% adults with diabetes    Mean Plasma Glucose 99.67 mg/dL    Comment: Performed at [facility], [address], [place], [place] [address]  Lipid panel     Status: Abnormal   Collection Time:  10/31/17  6:29 AM  Result  Value Ref Range   Cholesterol 230 (H) 0 - 200 mg/dL   Triglycerides 409162 (H) <150 mg/dL   HDL 75 >81>40 mg/dL   Total CHOL/HDL Ratio 3.1 RATIO   VLDL 32 0 - 40 mg/dL   LDL Cholesterol 191123 (H) 0 - 99 mg/dL    Comment:        Total Cholesterol/HDL:CHD Risk Coronary Heart Disease Risk Table                     Men   Women  1/2 Average Risk   3.4   3.3  Average Risk       5.0   4.4  2 X Average Risk   9.6   7.1  3 X Average Risk  23.4   11.0        Use the calculated Patient Ratio above and the CHD Risk Table to determine the patient's CHD Risk.        ATP III CLASSIFICATION (LDL):  <100     mg/dL   Optimal  [id number]  mg/dL   Near or Above                    Optimal  130-159  mg/dL   Borderline  [phone]  mg/dL   High  >657>190     mg/dL   Very High Performed at [facility], [address] [place], [place] [address]   TSH     Status: None   Collection Time: 10/31/17  6:29 AM  Result Value Ref Range   TSH 4.165 0.350 - 4.500 uIU/mL    Comment: Performed by a 3rd Generation assay with a functional sensitivity of <=0.01 uIU/mL. Performed at [facility], [address] [place], [place] [address]     Blood Alcohol level:  Lab Results  Component Value Date   ETH <10 10/29/2017    Metabolic Disorder Labs: Lab Results  Component Value Date   HGBA1C 5.1 10/31/2017   MPG 99.67 10/31/2017   No results found for: PROLACTIN Lab Results  Component Value Date   CHOL 230 (H) 10/31/2017   TRIG 162 (H) 10/31/2017   HDL 75 10/31/2017   CHOLHDL 3.1 10/31/2017   VLDL 32 10/31/2017   LDLCALC 123 (H) 10/31/2017    Physical Findings: AIMS: Facial and Oral Movements Muscles of Facial Expression: None, normal Lips and Perioral Area: None, normal Jaw: None, normal Tongue: None, normal,Extremity Movements Upper (arms, wrists, hands, fingers): None, normal Lower (legs, knees, ankles, toes): None, normal, Trunk  Movements Neck, shoulders, hips: None, normal, Overall Severity Severity of abnormal movements (highest score from questions above): None, normal Incapacitation due to abnormal movements: None, normal Patient's awareness of abnormal movements (rate only patient's report): No Awareness, Dental Status Current problems with teeth and/or dentures?: No Does patient usually wear dentures?: No  CIWA:  CIWA-Ar Total: 1 COWS:  COWS Total Score: 1  Musculoskeletal: Strength & Muscle Tone: within normal limits Gait & Station: normal Patient leans: N/A  Psychiatric Specialty Exam: Physical Exam  Constitutional: She is oriented to person, place, and time. She appears well-developed and well-nourished.  HENT:  Head: Normocephalic and atraumatic.  Respiratory: Effort normal.  Musculoskeletal: Normal range of motion.  Neurological: She is alert and oriented to person, place, and time.    ROS  Blood pressure 118/67, pulse 73, temperature 97.7 F (36.5 C), temperature source Oral, resp. rate 18, height 5\' 3"  (1.6 m), weight 112.9 kg (  249 lb).Body mass index is 44.11 kg/m.  General Appearance: Casual  Eye Contact:  Good  Speech:  Clear and Coherent  Volume:  Normal  Mood:  Anxious  Affect:  Congruent  Thought Process:  Coherent  Orientation:  Full (Time, Place, and Person)  Thought Content:  Logical  Suicidal Thoughts:  No  Homicidal Thoughts:  No  Memory:  Immediate;   Good  Judgement:  Intact  Insight:  Lacking  Psychomotor Activity:  Normal  Concentration:  Concentration: Good  Recall:  Fair  Fund of Knowledge:  Fair  Language:  Good  Akathisia:  No  Handed:  Right  AIMS (if indicated):     Assets:  Communication Skills Desire for Improvement Financial Resources/Insurance Housing Resilience Social Support Vocational/Educational  ADL's:  Intact  Cognition:  WNL  Sleep:  Number of Hours: 6     Treatment Plan Summary: Daily contact with patient to assess and evaluate  symptoms and progress in treatment, Medication management and Plan Patient is seen and examined.  Patient is a 47 year old female with the above-stated past psychiatric history seen in follow-up.  She was seen in treatment team today.  I told the patient that we would try and get her discharged tomorrow.  I did feel uncomfortable with changing her Zoloft since she will be leaving.  I recommended that after her discharge that she discuss with her psychiatrist any changes in her medications at that point.  I did encourage her to get into therapy to develop better coping skills for her stress.  Social work did contact her husband after the meeting today, and he is comfortable with her coming home.  I will passed the information forward so tomorrow's team will know that she can be discharged in the a.m.  No other changes to her medications.  Antonieta Pert, MD 10/31/2017, 3:04 PM

## 2017-10-31 NOTE — Plan of Care (Signed)
Nurse discussed depression and coping skills with patient.  

## 2017-10-31 NOTE — Progress Notes (Signed)
Recreation Therapy Notes  Date: 4.15.19 Time: 9:30 a.m. Location: [address] Dayroom   Group Topic: Stress Management   Goal Area(s) Addresses:  Goal 1.1: To reduce stress  -Patient will feel a reduction in stress level  -Patient will understand the importance of stress management  -Patient will participate during stress management group      Intervention: Stress Management  Activity: Guided Imagery: Patients were in a peaceful environment with soft lighting enhancing patients mood. Patients listened to a guided imagery script read by Recreation Therapy Intern.  Education: Stress Management, Discharge Planning.    Education Outcome: Acknowledges edcuation/In group clarification offered/Needs additional education   Clinical Observations/Feedback:: Patient did not attend     Sheryle HailDarian Mccade Sullenberger, Recreation Therapy Intern   Sheryle HailDarian Jekhi Bolin 10/31/2017 9:09 AM

## 2017-10-31 NOTE — Progress Notes (Signed)
Pt reports she is doing better this evening than when she came in early morning yesterday.  She denies SI/HI/AVH.  She voices no needs or concerns.  She says the doctor started her on new meds.  She feels she is better able to deal with her life changes since coming to the hospital.  Support and encouragement offered.  Discharge plans are in process.  Pt plans to return home.  Safety maintained with q15 minute checks.

## 2017-10-31 NOTE — Progress Notes (Signed)
D:  Patient's self inventory sheet, patient sleeps good, no sleep medication.  Good appetite, normal energy level, good concentration.  Rated depression 1, denied hopeless and anxiety.  Denied withdrawals.  Denied SI.  Denied physical problems.  Denied physical pain.  Goal is to try to forgive herself.  Plans to pray.  "I am in a much better place.  I'm ready to go home to family."  Unsure of discharge plans. A:  Medications administered per MD orders.  Emotional support and encouragement given patient.   R:  Patient denied SI and Hi, contracts for safety.  Denied A/V hallucinations.  Safety maintained with 15 minute checks.

## 2017-10-31 NOTE — Progress Notes (Signed)
Adult Psychoeducational Group Note  Date:  10/31/2017 Time:  9:24 PM  Group Topic/Focus:  Wrap-Up Group:   The focus of this group is to help patients review their daily goal of treatment and discuss progress on daily workbooks.  Participation Level:  Active  Participation Quality:  Appropriate  Affect:  Appropriate  Cognitive:  Alert  Insight: Appropriate  Engagement in Group:  Engaged  Modes of Intervention:  Discussion  Additional Comments:  Pt stated that today has been an Holiday representative"emotional rollacoaster". Pt stated that her goal is to work on forgiving herself. She stated that her family has forgiving her, but it is so hard for her to do herself.   Kaleen OdeaCOOKE, Selim Durden R 10/31/2017, 9:24 PM

## 2017-10-31 NOTE — Tx Team (Signed)
Interdisciplinary Treatment and Diagnostic Plan Update  10/31/2017 Time of Session: 9:22 AM  [name] MRN: [id number]  Principal Diagnosis: <principal problem not specified>  Secondary Diagnoses: Active Problems:   Severe recurrent major depression without psychotic features (HCC)   Current Medications:  Current Facility-Administered Medications  Medication Dose Route Frequency Provider Last Rate Last Dose  . acetaminophen (TYLENOL) tablet 650 mg  650 mg Oral Q6H PRN Lindon Romp A, NP      . albuterol (PROVENTIL HFA;VENTOLIN HFA) 108 (90 Base) MCG/ACT inhaler 1-2 puff  1-2 puff Inhalation Q4H PRN Lindon Romp A, NP      . ALPRAZolam Duanne Moron) tablet 0.5 mg  0.5 mg Oral Daily PRN Lindon Romp A, NP      . ALPRAZolam Duanne Moron) tablet 0.5 mg  0.5 mg Oral QHS PRN Sharma Covert, MD      . alum & mag hydroxide-simeth (MAALOX/MYLANTA) 200-200-20 MG/5ML suspension 30 mL  30 mL Oral Q4H PRN Lindon Romp A, NP      . hydrOXYzine (ATARAX/VISTARIL) tablet 25 mg  25 mg Oral TID PRN Rozetta Nunnery, NP   25 mg at 10/30/17 1849  . ibuprofen (ADVIL,MOTRIN) tablet 600 mg  600 mg Oral Q6H PRN Lindon Romp A, NP      . lisinopril (PRINIVIL,ZESTRIL) tablet 20 mg  20 mg Oral QPM Lindon Romp A, NP   20 mg at 10/30/17 1848  . magnesium hydroxide (MILK OF MAGNESIA) suspension 30 mL  30 mL Oral Daily PRN Lindon Romp A, NP      . pantoprazole (PROTONIX) EC tablet 40 mg  40 mg Oral Daily Lindon Romp A, NP   40 mg at 10/31/17 0814  . sertraline (ZOLOFT) tablet 100 mg  100 mg Oral Daily Sharma Covert, MD   100 mg at 10/31/17 9470  . traZODone (DESYREL) tablet 50 mg  50 mg Oral QHS PRN Rozetta Nunnery, NP        PTA Medications: Medications Prior to Admission  Medication Sig Dispense Refill Last Dose  . ALPRAZolam (XANAX) 0.5 MG tablet Take 1 tablet by mouth daily as needed for anxiety.    10/29/2017 at Unknown time  . esomeprazole (NEXIUM) 40 MG capsule Take 40-80 mg by mouth daily as needed (for  acid reflux).    10/29/2017 at Unknown time  . ibuprofen (ADVIL,MOTRIN) 200 MG tablet Take 600 mg by mouth every 6 (six) hours as needed for mild pain or moderate pain.   Past Week at Unknown time  . lisinopril (PRINIVIL,ZESTRIL) 20 MG tablet Take 20 mg by mouth every evening.    10/28/2017 at Unknown time  . PROAIR HFA 108 (90 Base) MCG/ACT inhaler INHALE 2 PUFFS BY MOUTH EVERY 6 HOURS AS NEEDED FOR SHORTNESS OF BREATH  0 UNKNOWN  . sertraline (ZOLOFT) 100 MG tablet Take 150 mg by mouth daily.    10/29/2017 at Unknown time    Patient Stressors: Medication change or noncompliance Occupational concerns  Patient Strengths: Ability for insight Active sense of humor Average or above average intelligence Capable of independent living Occupational psychologist fund of knowledge Motivation for treatment/growth Physical Health Supportive family/friends Work skills  Treatment Modalities: Medication Management, Group therapy, Case management,  1 to 1 session with clinician, Psychoeducation, Recreational therapy.   Physician Treatment Plan for Primary Diagnosis: <principal problem not specified> Long Term Goal(s): Improvement in symptoms so as ready for discharge  Short Term Goals: Ability to identify changes in lifestyle to reduce  recurrence of condition will improve Ability to verbalize feelings will improve Ability to disclose and discuss suicidal ideas Ability to demonstrate self-control will improve Ability to identify and develop effective coping behaviors will improve Ability to maintain clinical measurements within normal limits will improve Compliance with prescribed medications will improve Ability to identify changes in lifestyle to reduce recurrence of condition will improve Ability to verbalize feelings will improve Ability to disclose and discuss suicidal ideas Ability to demonstrate self-control will improve Ability to identify and develop effective coping  behaviors will improve Ability to maintain clinical measurements within normal limits will improve Compliance with prescribed medications will improve  Medication Management: Evaluate patient's response, side effects, and tolerance of medication regimen.  Therapeutic Interventions: 1 to 1 sessions, Unit Group sessions and Medication administration.  Evaluation of Outcomes: Adequate for Discharge  Physician Treatment Plan for Secondary Diagnosis: Active Problems:   Severe recurrent major depression without psychotic features ([place])   Long Term Goal(s): Improvement in symptoms so as ready for discharge  Short Term Goals: Ability to identify changes in lifestyle to reduce recurrence of condition will improve Ability to verbalize feelings will improve Ability to disclose and discuss suicidal ideas Ability to demonstrate self-control will improve Ability to identify and develop effective coping behaviors will improve Ability to maintain clinical measurements within normal limits will improve Compliance with prescribed medications will improve Ability to identify changes in lifestyle to reduce recurrence of condition will improve Ability to verbalize feelings will improve Ability to disclose and discuss suicidal ideas Ability to demonstrate self-control will improve Ability to identify and develop effective coping behaviors will improve Ability to maintain clinical measurements within normal limits will improve Compliance with prescribed medications will improve  Medication Management: Evaluate patient's response, side effects, and tolerance of medication regimen.  Therapeutic Interventions: 1 to 1 sessions, Unit Group sessions and Medication administration.  Evaluation of Outcomes: Adequate for Discharge   RN Treatment Plan for Primary Diagnosis: <principal problem not specified> Long Term Goal(s): Knowledge of disease and therapeutic regimen to maintain health will improve  Short  Term Goals: Ability to verbalize feelings will improve and Ability to identify and develop effective coping behaviors will improve  Medication Management: RN will administer medications as ordered by provider, will assess and evaluate patient's response and provide education to patient for prescribed medication. RN will report any adverse and/or side effects to prescribing provider.  Therapeutic Interventions: 1 on 1 counseling sessions, Psychoeducation, Medication administration, Evaluate responses to treatment, Monitor vital signs and CBGs as ordered, Perform/monitor CIWA, COWS, AIMS and Fall Risk screenings as ordered, Perform wound care treatments as ordered.  Evaluation of Outcomes: Adequate for Discharge   LCSW Treatment Plan for Primary Diagnosis: <principal problem not specified> Long Term Goal(s): Safe transition to appropriate next level of care at discharge, Engage patient in therapeutic group addressing interpersonal concerns.  Short Term Goals: Engage patient in aftercare planning with referrals and resources  Therapeutic Interventions: Assess for all discharge needs, 1 to 1 time with Social worker, Explore available resources and support systems, Assess for adequacy in community support network, Educate family and significant other(s) on suicide prevention, Complete Psychosocial Assessment, Interpersonal group therapy.  Evaluation of Outcomes: Met  Return home, follow up outpt   Progress in Treatment: Attending groups: Yes Participating in groups: Yes Taking medication as prescribed: Yes Toleration medication: Yes, no side effects reported at this time Family/Significant other contact made: Yes Patient understands diagnosis: Yes AEB asking for help with feeling overwhelmed Discussing patient  identified problems/goals with staff: Yes Medical problems stabilized or resolved: Yes Denies suicidal/homicidal ideation: Yes Issues/concerns per patient self-inventory: None Other:  N/A  New problem(s) identified: None identified at this time.   New Short Term/Long Term Goal(s): "I need help with forgiving myself and starting to move on, and to see the warning signs earlier."   Discharge Plan or Barriers:   Reason for Continuation of Hospitalization:  Medication stabilization   Estimated Length of Stay: Likely d/c tomorrow  Attendees: Patient: [name] 10/31/2017  9:22 AM  Physician: Myles Lipps, MD 10/31/2017  9:22 AM  Nursing: Darrol Angel, RN 10/31/2017  9:22 AM  RN Care Manager: Lars Pinks, RN 10/31/2017  9:22 AM  Social Worker: Ripley Fraise 10/31/2017  9:22 AM  Recreational Therapist: Winfield Cunas 10/31/2017  9:22 AM  Other: Norberto Sorenson 10/31/2017  9:22 AM  Other:  10/31/2017  9:22 AM    Scribe for Treatment Team:  Roque Lias LCSW 10/31/2017 9:22 AM

## 2017-10-31 NOTE — [facility] Suicide Risk Assessment (Signed)
[facility] INPATIENT:  Family/Significant Other Suicide Prevention Education  Suicide Prevention Education:  Education Completed; [name], [name], [phone]  has been identified by the patient as the family member/significant other with whom the patient will be residing, and identified as the person(s) who will aid the patient in the event of a mental health crisis (suicidal ideations/suicide attempt).  With written consent from the patient, the family member/significant other has been provided the following suicide prevention education, prior to the and/or following the discharge of the patient.  The suicide prevention education provided includes the following:  Suicide risk factors  Suicide prevention and interventions  National Suicide Hotline telephone number  [facility] assessment telephone number  [facility] Assistance 911  [facility] and/or [facility] Crisis [facility] telephone number  Request made of family/significant other to:  Remove weapons (e.g., guns, rifles, knives), all items previously/currently identified as safety concern.    Remove drugs/medications (over-the-counter, prescriptions, illicit drugs), all items previously/currently identified as a safety concern.  The family member/significant other verbalizes understanding of the suicide prevention education information provided.  The family member/significant other agrees to remove the items of safety concern listed above. Spoke with [name] with patient present.  He spoke of events leading up to the hospitalization.  How son found the trail of communication on her phone that revealed that she was having an affair, thus alerting [name] to the same.  Yesterday they talked together at visitation and came up with a plan for going forward, which included her willingness to cease all contact with the other man, that [name] would take time off from work immediately when patient leaves  hospital so that he can be more available since she will be off of work for a week and a half, and how they have agreed that she will see an individual therapist and they will see their pastor together for marital therapy.  [name] verified that guns in the home are locked up and patient does not have access to them.  [name] 10/31/2017, 1:00 PM

## 2017-10-31 NOTE — [facility] Group Notes (Signed)
Adult Psychoeducational Group Note  Date:  10/31/2017 Time:  4:51 PM  Group Topic/Focus:  Wellness Toolbox:   The focus of this group is to discuss various aspects of wellness, balancing those aspects and exploring ways to increase the ability to experience wellness.  Patients will create a wellness toolbox for use upon discharge.  Participation Level:  Active  Participation Quality:  Appropriate  Affect:  Appropriate  Cognitive:  Appropriate  Insight: Appropriate  Engagement in Group:  Engaged  Modes of Intervention:  Discussion  Additional Comments:  Pt attended and participated in psycho-educational group.  Dellia NimsJaquesha M Timothea Bodenheimer 10/31/2017, 4:51 PM

## 2017-11-01 MED ORDER — TRAZODONE HCL 50 MG PO TABS: 50.0000 mg | ORAL_TABLET | Freq: Every evening | ORAL | 0 refills | Status: DC | PRN

## 2017-11-01 MED ORDER — SERTRALINE HCL 100 MG PO TABS: 100.0000 mg | ORAL_TABLET | Freq: Every day | ORAL | 0 refills | Status: DC

## 2017-11-01 MED ORDER — HYDROXYZINE HCL 25 MG PO TABS: 25.0000 mg | ORAL_TABLET | Freq: Three times a day (TID) | ORAL | 0 refills | Status: DC | PRN

## 2017-11-01 NOTE — Plan of Care (Signed)
Nurse discussed anxiety, depression, coping skills with patient. 

## 2017-11-01 NOTE — Discharge Summary (Signed)
Physician Discharge Summary Note  Patient:  [name] is an 47 y.o., female MRN:  [id number] DOB:  04/15/1971 Patient phone:  [phone] (home)  Patient address:   [address] [place] [address],  Total Time spent with patient: 30 minutes  Date of Admission:  10/30/2017 Date of Discharge:11/01/2017  Reason for Admission:  Patient is seen and examined.  Patient is a 47 year old female with past psychiatric history significant for depression and anxiety who presented to the [facility] with suicidal ideation.  The patient stated at that time that she felt as though her mood was "bottoming out".  She also stated that she had hit "rock bottom".  Patient has been treated for depression anxiety for at least 10 years.  She has been treated with Zoloft over those years, and her dosage was increased 150 mg over the years.  She felt as though earlier this year that the medications were not effective, and spoke to her psychiatrist about this.  They attempted Wellbutrin augmentation, but that was not effective and the patient self stopped it.  She cited psychosocial stressors as the major issue going on in her life.  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  She is considered leaving teaching these children, but her principal continues to request that she continue.  She also felt as though her husband was not attending to their relationship, and she contacted an old female friend, and they have initiated a relationship.  Also her teenage son is getting ready to graduate from high school and she is fearful that he no longer needs her.  She admitted to helplessness, hopelessness and worthlessness.  She admitted to suicidal ideation.  She admitted to tearfulness.  She admitted to poor sleep.  She was admitted to the hospital for evaluation and stabilization. Associated Signs/Symptoms: Depression Symptoms:  depressed mood, anhedonia, insomnia, psychomotor  agitation, fatigue, feelings of worthlessness/guilt, difficulty concentrating, hopelessness, suicidal thoughts without plan, anxiety, loss of energy/fatigue, disturbed sleep, (Hypo) Manic Symptoms:  Impulsivity, Anxiety Symptoms:  Excessive Worry, Psychotic Symptoms:  Patient denied PTSD Symptoms: Negative   Past Psychiatric History: Patient stated she had not been hospitalized previously.  She has been treated for anxiety and depression with sertraline as well as alprazolam.  She had also previously been given Wellbutrin as an augmentation agent, but that had not been successful.  Outpatient psychiatrist.   Principal Problem: <principal problem not specified> Discharge Diagnoses: Patient Active Problem List   Diagnosis Date Noted  . Severe recurrent major depression without psychotic features (HCC) [F33.2] 10/30/2017  . Hypersomnia [G47.10] 04/16/2016  . Morbid obesity (HCC) [E66.01] 04/16/2016  . Tear of medial meniscus of left knee [S83.242A] 12/21/2013    Past Psychiatric History: MDD  Past Medical History:  Past Medical History:  Diagnosis Date  . Contact lens/glasses fitting    wears contacta or glasses  . Depression   . GERD (gastroesophageal reflux disease)   . Hypertension   . PONV (postoperative nausea and vomiting)   . Tear of medial meniscus of right knee 12/21/2013    Past Surgical History:  Procedure Laterality Date  . CHOLECYSTECTOMY  2008   lap choli  . DILATION AND CURETTAGE OF UTERUS    . ERCP  2008  . KNEE ARTHROSCOPY WITH MEDIAL MENISECTOMY Left 12/21/2013   Procedure: LEFT KNEE ARTHROSCOPY WITH MEDIAL MENISECTOMY;  Surgeon: Eulas Post, MD;  Location: [facility];  Service: Orthopedics;  Laterality: Left;  .  TONSILLECTOMY    . VAGINAL HYSTERECTOMY  2012   loa   Family History: History reviewed. No pertinent family history. Family Psychiatric  History: Mother with depression and anxiety. Social History:  Social History    Substance and Sexual Activity  Alcohol Use No     Social History   Substance and Sexual Activity  Drug Use No    Social History   Socioeconomic History  . Marital status: Married    Spouse name: Not on file  . Number of children: Not on file  . Years of education: Not on file  . Highest education level: Not on file  Occupational History  . Not on file  Social Needs  . Financial resource strain: Not on file  . Food insecurity:    Worry: Not on file    Inability: Not on file  . Transportation needs:    Medical: Not on file    Non-medical: Not on file  Tobacco Use  . Smoking status: Never Smoker  . Smokeless tobacco: Never Used  Substance and Sexual Activity  . Alcohol use: No  . Drug use: No  . Sexual activity: Not on file  Lifestyle  . Physical activity:    Days per week: Not on file    Minutes per session: Not on file  . Stress: Not on file  Relationships  . Social connections:    Talks on phone: Not on file    Gets together: Not on file    Attends religious service: Not on file    Active member of club or organization: Not on file    Attends meetings of clubs or organizations: Not on file    Relationship status: Not on file  Other Topics Concern  . Not on file  Social History Narrative  . Not on file    Hospital Course:  [name] was admitted for Severe recurrent major depression without psychotic features [facility]) and crisis management.  Patient presented to the ED with history of depression, worsening anxiety, insomnia, and social withdrawal.  She reports her main stressors are marital dysfunction, and work.  During this hospital admission patient reports being able to work out things with her husband and their relationship has therefore improved.  She is willing to go to counseling, and utilize coping skills that she learned during this admission.  During his admission she was treated with the following medications Zoloft 100 mg p.o. daily which she was  discharged with a prescription for, trazodone 50 mg p.o. nightly as needed also received a prescription for, hydroxyzine 25 mg p.o. 3 times daily as needed for anxiety prescription receive, and alprazolam 0.5 mg p.o. twice daily as needed for anxiety no prescription received upon discharge.  Home medications were resumed to include Protonix, lisinopril, albuterol inhaler, and ibuprofen. GURLEEN LARRIVEE was discharged with current medication and was instructed on how to take medications as prescribed; (details listed below under Medication List).  Medical problems were identified and treated as needed.  Patient labs obtained while in the ED have been reviewed and assessed and determined to be within normal range.  Cholesterol levels particularly LDL at 123, triglycerides elevated at 162, and total cholesterol elevated at 230.  A1c 5.1, TSH 4.165, and UDS reviewed on admission positive for benzodiazepines.  Improvement was monitored by observation and Derald Macleod daily report of symptom reduction.  Emotional and mental status was monitored by daily self-inventory reports completed by Derald Macleod and clinical staff.  TENECIA IGNASIAK was evaluated by the treatment team for stability and plans for continued recovery upon discharge.  ABIGAEL MOGLE motivation was an integral factor for scheduling further treatment.  Employment, transportation, bed availability, health status, family support, and any pending legal issues were also considered during her hospital stay.  She was offered further treatment options upon discharge including but not limited to Residential, Intensive Outpatient, and Outpatient treatment.  KAMRI GOTSCH will follow up with the services as listed below under Follow Up Information.     Upon completion of this admission the MALAIAH VIRAMONTES was both mentally and medically stable for discharge denying suicidal/homicidal ideation, auditory/visual/tactile hallucinations, delusional  thoughts and paranoia.      Physical Findings: AIMS: Facial and Oral Movements Muscles of Facial Expression: None, normal Lips and Perioral Area: None, normal Jaw: None, normal Tongue: None, normal,Extremity Movements Upper (arms, wrists, hands, fingers): None, normal Lower (legs, knees, ankles, toes): None, normal, Trunk Movements Neck, shoulders, hips: None, normal, Overall Severity Severity of abnormal movements (highest score from questions above): None, normal Incapacitation due to abnormal movements: None, normal Patient's awareness of abnormal movements (rate only patient's report): No Awareness, Dental Status Current problems with teeth and/or dentures?: No Does patient usually wear dentures?: No  CIWA:  CIWA-Ar Total: 1 COWS:  COWS Total Score: 2  Musculoskeletal: Strength & Muscle Tone: within normal limits Gait & Station: normal Patient leans: N/A  Psychiatric Specialty Exam:See MD SRA Physical Exam  ROS  Blood pressure 124/89, pulse 94, temperature 97.9 F (36.6 C), temperature source Oral, resp. rate 18, height 5\' 3"  (1.6 m), weight 112.9 kg (249 lb).Body mass index is 44.11 kg/m.  Sleep:  Number of Hours: 6.75     Have you used any form of tobacco in the last 30 days? (Cigarettes, Smokeless Tobacco, Cigars, and/or Pipes): No  Has this patient used any form of tobacco in the last 30 days? (Cigarettes, Smokeless Tobacco, Cigars, and/or Pipes) No  Blood Alcohol level:  Lab Results  Component Value Date   ETH <10 10/29/2017    Metabolic Disorder Labs:  Lab Results  Component Value Date   HGBA1C 5.1 10/31/2017   MPG 99.67 10/31/2017   No results found for: PROLACTIN Lab Results  Component Value Date   CHOL 230 (H) 10/31/2017   TRIG 162 (H) 10/31/2017   HDL 75 10/31/2017   CHOLHDL 3.1 10/31/2017   VLDL 32 10/31/2017   LDLCALC 123 (H) 10/31/2017    See Psychiatric Specialty Exam and Suicide Risk Assessment completed by Attending Physician prior to  discharge.  Discharge destination:  Home  Is patient on multiple antipsychotic therapies at discharge:  No   Has Patient had three or more failed trials of antipsychotic monotherapy by history:  No  Recommended Plan for Multiple Antipsychotic Therapies: NA  Discharge Instructions    Discharge instructions   Complete by:  As directed    Discharge Recommendations:  The patient is being discharged to her family. Patient is to take her discharge medications as ordered. See follow up below. We recommend that she participate in individual therapy to target depressive symptoms and improving coping skills. Discussed with patient the importance of making responsible decisions and taking with her sparents. Pt has a good family support system that she can continue to use to help maximize her safety plan and treatment options. Encouraged patient to trust her outpatient provider and therapist to ensure that she gets the most information out of each session so  that she can make more informative decisions about her care. SHe is asked to make sure she is familiar with the symptoms of depression, so that she can advise her therapist when things begin to become abnormal for her. We recommend that she participate in family therapy to target the conflict with his family , and improving communication skills and conflict resolution skills. Family is to initiate/implement a contingency based behavioral model to address patient's behavior. The patient should abstain from all illicit substances, alcohol, and peer pressure. If the patient's symptoms worsen or do not continue to improve or if the patient becomes actively suicidal or homicidal then it is recommended that the patient return to the closest hospital emergency room or call 911 for further evaluation and treatment. National Suicide Prevention Lifeline 1800-SUICIDE or 41760480621800-(458)216-2605. Please follow up with your primary medical doctor for all other medical  needs.     The patient has been educated on the possible side effects to medications and she/her guardian is to contact a medical professional and inform outpatient provider of any new side effects of medication. SHe is to take regular diet and activity as tolerated.  Family was educated about removing/locking any firearms, medications or dangerous products from the home.     Allergies as of 11/01/2017      Reactions   Oxycodone Hives   Sulfa Antibiotics Hives   Tamiflu [oseltamivir Phosphate] Hives      Medication List    STOP taking these medications   ALPRAZolam 0.5 MG tablet Commonly known as:  XANAX   ibuprofen 200 MG tablet Commonly known as:  ADVIL,MOTRIN     TAKE these medications     Indication  esomeprazole 40 MG capsule Commonly known as:  NEXIUM Take 40-80 mg by mouth daily as needed (for acid reflux).  Indication:  Gastroesophageal Reflux Disease   hydrOXYzine 25 MG tablet Commonly known as:  ATARAX/VISTARIL Take 1 tablet (25 mg total) by mouth 3 (three) times daily as needed for anxiety.  Indication:  Feeling Anxious, State of Being Sedated, Tension   lisinopril 20 MG tablet Commonly known as:  PRINIVIL,ZESTRIL Take 20 mg by mouth every evening.  Indication:  High Blood Pressure Disorder   PROAIR HFA 108 (90 Base) MCG/ACT inhaler Generic drug:  albuterol INHALE 2 PUFFS BY MOUTH EVERY 6 HOURS AS NEEDED FOR SHORTNESS OF BREATH  Indication:  Asthma   sertraline 100 MG tablet Commonly known as:  ZOLOFT Take 1 tablet (100 mg total) by mouth daily. Start taking on:  11/02/2017 What changed:  how much to take  Indication:  Major Depressive Disorder   traZODone 50 MG tablet Commonly known as:  DESYREL Take 1 tablet (50 mg total) by mouth at bedtime as needed for sleep.  Indication:  Trouble Sleeping      Follow-up Information    [facility] Follow up.   Specialty:  Behavioral Health Why:  You can call if  interested in getting more information here. Contact information: [address] [place] [phone]       [facility] Follow up on 11/03/2017.   Why:  Thursday [facility] 2:30 [facility] Contact information: [address] [place]  [phone] F: [phone]          Follow-up recommendations:  Activity:  Increase activity as tolerated. Diet:  Routine house diet Tests:  Routine test and suggested by her outpatient psychiatrist. Other:  Even if  he begins to feel better continue to take your medication.  You are currently taking an SSRI keep in mind this medication takes about 4-6 weeks before you meet therapeutic efficacy.    Signed: Truman Hayward, FNP 11/01/2017, 10:38 AM

## 2017-11-01 NOTE — Progress Notes (Signed)
  [facility] :  Will you be returning to the same living situation after discharge:  Yes,  patient reports she is returning home with her husband At discharge, do you have transportation home?: Yes,  patient's husband picked her up Do you have the ability to pay for your medications: Yes,  BC[employer]  Release of information consent forms completed and in the chart;  Patient's signature needed at discharge.  Patient to Follow up at: Follow-up Information    [facility] Follow up.   Specialty:  Behavioral Health Why:  You can call if interested in getting more information here. Contact information: [address] [place] [phone]       [facility] Follow up on 11/03/2017.   Why:  Thursday at 2:30 with Vincent Peyeregina Contact information: [address] [place]  [phone] F: [phone]          Next level of care provider has access to [facility] Link:yes  Safety Planning and Suicide Prevention discussed: Yes,  with the patient's husband  Have you used any form of tobacco in the last 30 days? (Cigarettes, Smokeless Tobacco, Cigars, and/or Pipes): No  Has patient been referred to the Quitline?: N/A patient is not a smoker  Patient has been referred for addiction treatment: N/A  [name], LCSWA 11/01/2017, 4:09 PM

## 2017-11-01 NOTE — Progress Notes (Signed)
Discharge Note:  Patient discharged home with husband.  Patient denied SI and HI.  Denied A/V hallucinations.  Suicide prevention information given and discussed with patient who stated she understood and had no questions.  Patient stated she received all her clothing, toiletries, misc items.  Patient stated she appreciated all assistance received from [facility].  All required discharge information given to patient at discharge.

## 2017-11-01 NOTE — Progress Notes (Signed)
Pt was observed earlier in the evening visiting with her husband.  She says they have talked and are going to work on their marriage.  He is going to take some time off to spend with her.  She denies SI/HI/AVH.  She still presents flat and depressed, but smiles in conversation with Clinical research associatewriter.  Pt voices no other needs or concerns and chose to take a sleep aid tonight so that she can rest.  She anticipates being discharged home tomorrow.  Support and encouragement offered.  Discharge plans are in process.  Safety maintained with q15 minute checks.

## 2017-11-01 NOTE — Progress Notes (Signed)
Recreation Therapy Notes  Animal-Assisted Activity (AAA) Program Checklist/Progress Notes Patient Eligibility Criteria Checklist & Daily Group note for Rec Tx Intervention Date: 4.16.19. Time: 2:45 p.m.  Location: [address] Dayroom   AAA/T Program Assumption of Risk Form signed by Patient/ or Parent Legal Guardian Yes  Patient is free of allergies or sever asthma Yes  Patient reports no fear of animals Yes  Patient reports no history of cruelty to animals Yes  Patient understands his/her participation is voluntary Yes  Patient washes hands before animal contact Yes  Patient washes hands after animal contact Yes  Education: Hand Washing, Appropriate Animal Interaction   Education Outcome: Acknowledges Education.   Clinical Observations/Feedback: Patient did not attend   Brandace Cargle, Recreation Therapy Intern   [name] 11/01/2017 2:37 PM 

## 2017-11-01 NOTE — [facility] Group Notes (Signed)
LCSW Group Therapy Note 11/01/2017 3:47 PM  Type of Therapy/Topic: Group Therapy: Balance in Life  Participation Level: Did Not Attend  Description of Group:  This group will address the concept of balance and how it feels and looks when one is unbalanced. Patients will be encouraged to process areas in their lives that are out of balance and identify reasons for remaining unbalanced. Facilitators will guide patients in utilizing problem-solving interventions to address and correct the stressor making their life unbalanced. Understanding and applying boundaries will be explored and addressed for obtaining and maintaining a balanced life. Patients will be encouraged to explore ways to assertively make their unbalanced needs known to significant others in their lives, using other group members and facilitator for support and feedback.  Therapeutic Goals: 1. Patient will identify two or more emotions or situations they have that consume much of in their lives. 2. Patient will identify signs/triggers that life has become out of balance:  3. Patient will identify two ways to set boundaries in order to achieve balance in their lives:  4. Patient will demonstrate ability to communicate their needs through discussion and/or role plays  Summary of Patient Progress:  Invited, chose not to attend.     Therapeutic Modalities:  Cognitive Behavioral Therapy Solution-Focused Therapy Assertiveness Training   Alcario DroughtJolan Trustin Chapa LCSWA Clinical Social Worker

## 2017-11-01 NOTE — Progress Notes (Signed)
D:  Patient's self inventory sheet, patient sleeps good, no sleep medication given.  Good appetite, normal energy level, good concentration.  Denied depression, hopeless and anxiety.  Withdrawing from drugs/alcohol.  Denied SI.  Denied physical problems.  Denied physical pain.  Goal is to continue to find peace/forgiveness.  Plans to have positive thoughts and going home.  "I am ready to go home to my wonderful family." A:  Medications administered per MD orders.  Emotional support and encouragement given patient. R:  Denied SI and HI, contracts for safety.  Denied A/V hallucinations.  Safety maintained with 15 minute checks.

## 2017-11-01 NOTE — BHH Suicide Risk Assessment (Signed)
[facility]   Principal Problem: Depression Discharge Diagnoses:  Patient Active Problem List   Diagnosis Date Noted  . Severe recurrent major depression without psychotic features (HCC) [F33.2] 10/30/2017  . Hypersomnia [G47.10] 04/16/2016  . Morbid obesity (HCC) [E66.01] 04/16/2016  . Tear of medial meniscus of left knee [S83.242A] 12/21/2013    Total Time spent with patient: 45 minutes  Musculoskeletal: Strength & Muscle Tone: within normal limits Gait & Station: normal Patient leans: N/A  Psychiatric Specialty Exam: ROS no headache, no chest pain, no shortness of breath, no vomiting , no fever   Blood pressure 124/89, pulse 94, temperature 97.9 F (36.6 C), temperature source Oral, resp. rate 18, height 5\' 3"  (1.6 m), weight 112.9 kg (249 lb).Body mass index is 44.11 kg/m.  General Appearance: Well Groomed  Eye Contact::  Good  Speech:  Normal Rate409  Volume:  Normal  Mood:  states she is feeling back to normal, denies depression at this time and presents euthymic at this time  Affect:  Appropriate and Full Range  Thought Process:  Linear and Descriptions of Associations: Intact  Orientation:  Full (Time, Place, and Person)  Thought Content:  no hallucinations, no delusions, not internally preoccupied   Suicidal Thoughts:  No denies any suicidal or self injurious ideations, no homicidal or violent ideations  Homicidal Thoughts:  No  Memory:  recent and remote grossly intact   Judgement:  Other:  improving   Insight:  improving   Psychomotor Activity:  Normal  Concentration:  Good  Recall:  Good  Fund of Knowledge:Good  Language: Good  Akathisia:  Negative  Handed:  Right  AIMS (if indicated):     Assets:  Communication Skills Desire for Improvement Resilience  Sleep:  Number of Hours: 6.75  Cognition: WNL  ADL's:  Intact   Mental Status Per Nursing Assessment::   On Admission:     Demographic Factors:  47 year old married  female, has one son, employed   Loss Factors: Stressful job, marital issues, son going to college in the near future   Historical Factors: No prior psychiatric admissions, states she has never attempted suicide, history of depression.   Risk Reduction Factors:   Responsible for children under 45 years of age, Sense of responsibility to family, Employed, Living with another person, especially a relative and Positive coping skills or problem solving skills  Continued Clinical Symptoms:  At this time patient is alert, attentive, well related, mood is improved and currently denies depression and presents euthymic, affect appropriate, no thought disorder, no suicidal or self injurious ideations, no homicidal or violent ideations, future oriented. States she has worked issues out with her husband and feels their relationship is now improved. As per Dr. Jola Babinskilary sign out, patient has stabilized and the collateral information from husband is that patient presents much improved /ready for discharge .  Behavior on unit in good control, pleasant on approach. Denies medication side effects.   Cognitive Features That Contribute To Risk:  No gross cognitive deficits noted upon discharge. Is alert , attentive, and oriented x 3    Suicide Risk:  Mild:  Suicidal ideation of limited frequency, intensity, duration, and specificity.  There are no identifiable plans, no associated intent, mild dysphoria and related symptoms, good self-control (both objective and subjective assessment), few other risk factors, and identifiable protective factors, including available and accessible social support.  Follow-up Information    [facility] Follow up.   Specialty:  Behavioral Health Why:  You can call if interested in getting more information here. Contact information: [address] [place] [address] [phone]       [facility]  Psychiatric Follow up on 11/03/2017.   Why:  Thursday at 2:30 with [facility] information: [id number] [facility]  [phone] F: [phone]          Plan Of Care/Follow-up recommendations:  Activity:  as tolerated  Diet:  heart healthy Tests:  NA Other:  See below Patient is requesting discharge and there are no current grounds for involuntary commitment- she is leaving unit in good spirits. Plans to return home. Follow up as above.  She has an established PCP at [facility] in [place] for medical management as needed . Craige Cotta, MD 11/01/2017, 9:47 AM

## 2017-11-01 NOTE — BHH Group Notes (Signed)
Adult Psychoeducational Group Note  Date:  11/01/2017 Time:  9:55 AM  Group Topic/Focus:  Goals Group:   The focus of this group is to help patients establish daily goals to achieve during treatment and discuss how the patient can incorporate goal setting into their daily lives to aide in recovery.  Participation Level:  Active  Participation Quality:  Appropriate  Affect:  Appropriate  Cognitive:  Appropriate  Insight: Appropriate  Engagement in Group:  Engaged  Modes of Intervention:  Orientation  Additional Comments:  Pt attended and participated in orientation/goals group. Pt goal for today is to find peace and get prepared for discharge.  Dellia NimsJaquesha M Josilynn Losh 11/01/2017, 9:55 AM

## 2018-03-27 ENCOUNTER — Telehealth: Payer: Self-pay | Admitting: Adult Health

## 2018-03-27 NOTE — Telephone Encounter (Signed)
Called and spoke with Patient. She is needing a new CPAP mask.  Patient last seen 04/15/16, by Dr. Clayborn Bigness.  Patient DME is [employer].  Patient is aware Dr. Clayborn Bigness is no longer with [facility] practice.  Appointment made 03/28/18 at 3:45pm with Shana Chute.  Nothing further at this time.

## 2018-03-28 ENCOUNTER — Ambulatory Visit: Payer: Self-pay | Admitting: Primary Care

## 2018-04-20 ENCOUNTER — Ambulatory Visit: Payer: BC Managed Care – PPO | Admitting: Mental Health

## 2018-04-20 DIAGNOSIS — F331 Major depressive disorder, recurrent, moderate: Secondary | ICD-10-CM | POA: Diagnosis not present

## 2018-04-20 DIAGNOSIS — F411 Generalized anxiety disorder: Secondary | ICD-10-CM

## 2018-04-20 NOTE — Progress Notes (Signed)
    Patient ID: [name], MRN: [id number]  Date: 04/20/2018  Timespent: 45 minutes  Treatment Type: Individual  Subjective: Open, appropriate, conversant, somewhat anxious around people, eager, eager to learn and improve.  Marital satisfaction increasing.  Interventions:CBT  Mental Status Exam:   Appearance:   Fairly Groomed     Behavior:  Appropriate  Motor:  Normal  Speech/Language:   Normal Rate  Affect:  Appropriate  Mood:  anxious  Thought process:  Coherent  Thought content:    Logical  Perceptual disturbances:    Normal  Orientation:  Full (Time, Place, and Person)  Attention:  Good  Concentration:  down slightly  Memory:  Immediate  Fund of knowledge:   Good  Insight:    Good  Judgment:   Fair  Impulse Control:  good    Reported Symptoms: Anxiety around people, increasing self-awareness, encouraged.  Positive anticipations.  Risk Assessment: Danger to Self:  No  Self-injurious Behavior: No Danger to Others: No Duty to Warn:N/A Physical Aggression / Violence:No  Access to Firearms a concern: No  Gang Involvement:No   Diagnosis:   ICD-10-CM   1. Major depressive disorder, recurrent episode, moderate (HCC) F33.1   2. Generalized anxiety disorder F41.1      Plan: Continue biblio therapy, continue self care program, return to office in 1 week.  Ulice Bold, [place]

## 2018-04-27 ENCOUNTER — Ambulatory Visit: Payer: BC Managed Care – PPO | Admitting: Mental Health

## 2018-04-27 DIAGNOSIS — F411 Generalized anxiety disorder: Secondary | ICD-10-CM

## 2018-04-27 DIAGNOSIS — F331 Major depressive disorder, recurrent, moderate: Secondary | ICD-10-CM | POA: Diagnosis not present

## 2018-04-27 DIAGNOSIS — F909 Attention-deficit hyperactivity disorder, unspecified type: Secondary | ICD-10-CM

## 2018-04-27 NOTE — Progress Notes (Signed)
      [facility] Counselor/Therapist Progress Note   Patient ID: [name], MRN: [id number]  Date: 04/27/2018  Timespent: 45 minutes  Treatment Type: Individual s Subjective: Depression improved. 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 Relationship with husband improving. Improving in binge eating but still spending money impulsively when anxious or depressed. Low energy.  Interventions:CBT, Assertiveness Training and Supportive  Mental Status Exam:   Appearance:   Casual     Behavior:  Appropriate and Sharing  Motor:  Normal  Speech/Language:   Clear and Coherent  Affect:  Appropriate  Mood:  anxious  Thought process:  Coherent  Thought content:    WDL  Perceptual disturbances:    Normal  Orientation:  Full (Time, Place, and Person)  Attention:  Problem with attention  Concentration:  difficulty with focus ans memory  Memory:  Immediate  Fund of knowledge:   Good  Insight:    Good  Judgment:   Good  Impulse Control:  good    Reported Symptoms: anxiety, some OCD tendencies, some ADHD tendencies, impulse control can be poor.  Risk Assessment: Danger to Self:  No Self-injurious Behavior: No Danger to Others: No Duty to Warn:no Physical Aggression / Violence:No  Access to Firearms a concern: No  Gang Involvement:No   Diagnosis:   ICD-10-CM   1. Major depressive disorder, recurrent episode, moderate (HCC) F33.1   2. 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 Continue to work on increasing marital satisfaction. Continue to monitor internal emotional environment and to add deposits where needed. Return to office in 2 weeks.  Ulice Bold, LPC     g teaching responsibilities

## 2018-05-03 ENCOUNTER — Ambulatory Visit: Payer: BC Managed Care – PPO | Admitting: Physician Assistant

## 2018-05-03 ENCOUNTER — Encounter: Payer: Self-pay | Admitting: Physician Assistant

## 2018-05-03 VITALS — BP 159/95 | HR 74 | Ht 63.0 in | Wt 255.0 lb

## 2018-05-03 DIAGNOSIS — F411 Generalized anxiety disorder: Secondary | ICD-10-CM | POA: Diagnosis not present

## 2018-05-03 DIAGNOSIS — F3160 Bipolar disorder, current episode mixed, unspecified: Secondary | ICD-10-CM

## 2018-05-03 MED ORDER — ALPRAZOLAM 0.5 MG PO TABS: 0.5000 mg | ORAL_TABLET | Freq: Two times a day (BID) | ORAL | 0 refills | Status: DC | PRN

## 2018-05-03 MED ORDER — BREXPIPRAZOLE 1 MG PO TABS: 1.0000 mg | ORAL_TABLET | ORAL | 1 refills | Status: DC

## 2018-05-03 NOTE — Progress Notes (Signed)
[facility] MD/PA/NP Initial Note  05/03/2018 6:06 PM [name]  MRN:  [id number]  Chief Complaint:  Chief Complaint    Depression; Anxiety; New Patient (Initial Visit)    Initial visit  HPI: [name] presents for an initial visit today, referred by Ulice Bold, [facility],  for eval/tx anxiety and depressive symptoms.  She has had the symptoms off and on for several years.  In February of this year, she states she had kind of a breakdown.  Her son was to graduate from high school in June and she was already starting to feel some emptying nesting.  She had a lapse in judgment and had a 2-week affair with an old friend.  States she has been happily married for many years and nothing like this has ever happened before.  She was admitted to [facility] behavioral health for 3 days in February, for depressive symptoms.  She has not had suicidal thoughts then or now.  Has never attempted before.  Reports other impulsive behaviors like increased spending for high and make up that she does not need, buying things on [employer] that are necessarily not needed.  She has no energy and feels like she could sleep many hours.  It does not matter how long she sleeps she always feels tired when she gets up.  She has trouble concentrating unless she feels really happy and productive.  She can have ups and downs during the same day where for a couple of hours she will feel really happy to get a lot done and then her mood will go [place] and she feels less motivated and more blue.  She has never had episodes where she has had increased energy with decreased need for sleep.  She does not gamble.  She has never had any sexual promiscuity or increased libido until this episode in February.  The increased spending comes in spurts.  She does have been cheating at times where she will eat a whole box of cookies and then feels sick even but never purchase.  Then she may go several months without eating anything for comfort.  She denies  grandiosity.  She also has anxiety.  No panic attacks but just a generalized sense of jitteriness on the inside like she needs to be doing something but has no energy to do so.  She is wondered if she has ADHD.  For many years she has had trouble concentrating and finishing task.  She will often start many projects and not complete them.  She has a masters degree but states she does not know how she got through college although she made good grades.  It was really hard for her to focus and get her work done.  Medications tried in the past include Zoloft which was not helpful, Prozac which she is on now and it has been helpful with the depression, Vraylar, Wellbutrin, Abilify, and Vyvanse which have all not helped.  Visit Diagnosis:    ICD-10-CM   1. Bipolar I disorder, most recent episode mixed (HCC) F31.60   2. Generalized anxiety disorder F41.1     Past Psychiatric History: She was admitted to [facility] behavioral health for 3 days in February of this year for anxiety and depression   past Medical History:  Past Medical History:  Diagnosis Date  . Anxiety   . Asthma   . Contact lens/glasses fitting    wears contacta or glasses  . Depression   . Fatigue   . GERD (gastroesophageal reflux disease)   .  Headache   . Hypertension   . PONV (postoperative nausea and vomiting)   . Tear of medial meniscus of right knee 12/21/2013    Past Surgical History:  Procedure Laterality Date  . CHOLECYSTECTOMY  2008   lap choli  . DILATION AND CURETTAGE OF UTERUS    . ERCP  2008  . KNEE ARTHROSCOPY WITH MEDIAL MENISECTOMY Left 12/21/2013   Procedure: LEFT KNEE ARTHROSCOPY WITH MEDIAL MENISECTOMY;  Surgeon: Eulas Post, MD;  Location: [facility];  Service: Orthopedics;  Laterality: Left;  . TONSILLECTOMY    . VAGINAL HYSTERECTOMY  2012   loa    Family Psychiatric History: see below  Family History:  Family History  Problem Relation Age of Onset  . Depression Mother   . Drug  abuse Father   . Tourette syndrome Father   . Depression Sister   . Anxiety disorder Sister   . Alcohol abuse Paternal Uncle     Social History:  Social History   Socioeconomic History  . Marital status: Married    Spouse name: [name]  . Number of children: 1  . Years of education: Not on file  . Highest education level: Master's degree (e.g., MA, MS, MEng, MEd, MSW, MBA)  Occupational History  . 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  Social Needs  . Financial resource strain: Not hard at all  . Food insecurity:    Worry: Never true    Inability: Never true  . Transportation needs:    Medical: No    Non-medical: No  Tobacco Use  . Smoking status: Never Smoker  . Smokeless tobacco: Never Used  Substance and Sexual Activity  . Alcohol use: Never    Frequency: Never  . Drug use: Never  . Sexual activity: Yes    Birth control/protection: None, Surgical  Lifestyle  . Physical activity:    Days per week: 7 days    Minutes per session: 20 min  . Stress: Very much  Relationships  . Social connections:    Talks on phone: More than three times a week    Gets together: Once a week    Attends religious service: More than 4 times per year    Active member of club or organization: Yes    Attends meetings of clubs or organizations: More than 4 times per year    Relationship status: Married  Other Topics Concern  . Not on file  Social History Narrative  . Not on file    Allergies:  Allergies  Allergen Reactions  . Oxycodone Hives  . Sulfa Antibiotics Hives  . Tamiflu [Oseltamivir Phosphate] Hives    Metabolic Disorder Labs: Lab Results  Component Value Date   HGBA1C 5.1 10/31/2017   MPG 99.67 10/31/2017   No results found for: PROLACTIN Lab Results  Component Value Date   CHOL 230 (H) 10/31/2017   TRIG 162 (H) 10/31/2017   HDL 75 10/31/2017   CHOLHDL 3.1 10/31/2017   VLDL 32 10/31/2017   LDLCALC 123 (H) 10/31/2017   Lab Results  Component Value  Date   TSH 4.165 10/31/2017    Therapeutic Level Labs: No results found for: LITHIUM No results found for: VALPROATE No components found for:  CBMZ  Current Medications: Current Outpatient Medications  Medication Sig Dispense Refill  . ALPRAZolam (XANAX) 0.5 MG tablet Take 0.5 mg by mouth as needed.    Marland Kitchen esomeprazole (NEXIUM) 40 MG capsule Take 40-80 mg by mouth daily as needed (for acid reflux).     Marland Kitchen  FLUoxetine (PROZAC) 10 MG tablet Take 40 mg by mouth daily.    Marland Kitchen lisinopril (PRINIVIL,ZESTRIL) 20 MG tablet Take 20 mg by mouth every evening.     Marland Kitchen PROAIR HFA 108 (90 Base) MCG/ACT inhaler INHALE 2 PUFFS BY MOUTH EVERY 6 HOURS AS NEEDED FOR SHORTNESS OF BREATH  0  . ALPRAZolam (XANAX) 0.5 MG tablet Take 1 tablet (0.5 mg total) by mouth 2 (two) times daily as needed for anxiety. 60 tablet 0  . Brexpiprazole (REXULTI) 1 MG TABS Take 1 tablet (1 mg total) by mouth every morning. 30 tablet 1  . hydrOXYzine (ATARAX/VISTARIL) 25 MG tablet Take 1 tablet (25 mg total) by mouth 3 (three) times daily as needed for anxiety. (Patient not taking: Reported on 05/03/2018) 30 tablet 0  . sertraline (ZOLOFT) 100 MG tablet Take 1 tablet (100 mg total) by mouth daily. (Patient not taking: Reported on 05/03/2018) 30 tablet 0  . traZODone (DESYREL) 50 MG tablet Take 1 tablet (50 mg total) by mouth at bedtime as needed for sleep. (Patient not taking: Reported on 05/03/2018) 30 tablet 0   No current facility-administered medications for this visit.    Medication Side Effects: None  Orders placed this visit:  No orders of the defined types were placed in this encounter.   Psychiatric Specialty Exam: ROS  Blood pressure (!) 159/95, pulse 74, height 5\' 3"  (1.6 m), weight 255 lb (115.7 kg).Body mass index is 45.17 kg/m.  General Appearance: Well Groomed obese  Eye Contact:  Good  Speech:  Clear and Coherent  Volume:  Normal  Mood:  Euthymic  Affect:  Appropriate  Thought Process:  Goal Directed   Orientation:  Full (Time, Place, and Person)  Thought Content: Logical   Suicidal Thoughts:  No  Homicidal Thoughts:  No  Memory:  Immediate  Judgement:  Good  Insight:  Good  Psychomotor Activity:  Normal  Concentration:  Concentration: Fair  Recall:  Good  Fund of Knowledge: Good  Language: Good  Akathisia:  No  AIMS (if indicated): not done  Assets:  Desire for Improvement  ADL's:  Intact  Cognition: WNL  Prognosis:  Good   Screenings:  PHQ2-9     Office Visit from 05/03/2018 in [facility]  PHQ-2 Total Score  2  PHQ-9 Total Score  17       Treatment Plan/Recommendations: I spent 75 minutes in this initial visit, at least 50% in counseling reference diagnosis and treatment options. We discussed bipolar disorder, hypomania, depressive symptoms, SSRIs SNRIs, atypical antipsychotics, mood stabilizers, lithium, and benefits, risks, side effects of all the medications. Continue Prozac.  She is not 100% certain of the dose so we will not prescribe that at this time.  She will call the office tomorrow and give [place] the dose and I will E prescribe that for her. Start Rexulti sample pack 0.5 mg every morning for 1 week, then 1 mg every morning and then a prescription for 1 mg daily. Continue Xanax 0.5 mg 1 p.o. twice daily as needed anxiety. Continue psychotherapy with Ulice Bold, LPC. Return in 4 weeks.   Renee Overly, PA-C

## 2018-05-04 ENCOUNTER — Other Ambulatory Visit: Payer: Self-pay

## 2018-05-05 ENCOUNTER — Telehealth: Payer: Self-pay

## 2018-05-05 NOTE — Telephone Encounter (Signed)
Prior approval received for Rexulti 1mg  through 05/04/2021

## 2018-05-10 ENCOUNTER — Other Ambulatory Visit: Payer: Self-pay

## 2018-05-10 MED ORDER — FLUOXETINE HCL 20 MG PO CAPS: 20.0000 mg | ORAL_CAPSULE | Freq: Every day | ORAL | 2 refills | Status: DC

## 2018-05-11 ENCOUNTER — Ambulatory Visit: Payer: BC Managed Care – PPO | Admitting: Mental Health

## 2018-05-11 DIAGNOSIS — F3181 Bipolar II disorder: Secondary | ICD-10-CM | POA: Diagnosis not present

## 2018-05-11 NOTE — Progress Notes (Signed)
      [facility] Counselor/Therapist Progress Note   Patient ID: NEAL OSHEA, MRN: [id number]  Date: 05/11/2018  Timespent: 45 minutes  Treatment Type: Family with patient  Subjective:  Happy to get the correct diagnosis of bipolar and to get on the correct medication. Feels really good. Some sadness due to death of uncle from CVI this week.            Interventions:CBT and Supportive  Mental Status Exam:   Appearance:   Casual     Behavior:  Appropriate and Sharing  Motor:  Normal  Speech/Language:   Clear and Coherent  Affect:  Appropriate  Mood:  normal  Thought process:  Coherent  Thought content:    WDL  Perceptual disturbances:    Normal  Orientation:  Full (Time, Place, and Person)  Attention:  Good  Concentration:  good  Memory:  Immediate  Fund of knowledge:   Good  Insight:    Good  Judgment:   Good  Impulse Control:  good    Reported Symptoms: Sleeping well, not acting on compulsions, very encouraged at progress.  Risk Assessment: Danger to Self:  No Self-injurious Behavior: No Danger to Others: No Duty to Warn:no Physical Aggression / Violence:No  Access to Firearms a concern: No  Gang Involvement:No   Diagnosis:   ICD-10-CM   1. Bipolar II disorder (HCC) F31.81      Plan:   Adjust to new job [employer] program Start exercising Medication compliance Return to office in two weeks  Ulice Bold, [facility]

## 2018-05-25 ENCOUNTER — Ambulatory Visit: Payer: BC Managed Care – PPO | Admitting: Mental Health

## 2018-06-01 ENCOUNTER — Encounter

## 2018-06-01 ENCOUNTER — Ambulatory Visit: Payer: BC Managed Care – PPO | Admitting: Mental Health

## 2018-06-01 DIAGNOSIS — F3181 Bipolar II disorder: Secondary | ICD-10-CM | POA: Diagnosis not present

## 2018-06-01 NOTE — Progress Notes (Signed)
      [facility] Counselor/Therapist Progress Note   Patient ID: Derald MacleodJulie M Iten, MRN: [id number]  Date: 06/01/2018  Timespent: 45 minutes   Treatment Type: Individual   Reported Symptoms: Feelings of Worthlessness, Hopelessness, Fatigue and anxiety   Mental Status Exam:    Appearance:   Well Groomed     Behavior:  Appropriate and Sharing  Motor:  Normal  Speech/Language:   Clear and Coherent  Affect:  Depressed and anxious  Mood:  anxious, depressed, labile and sad  Thought process:  goal directed  Thought content:    WNL  Sensory/Perceptual disturbances:    WNL  Orientation:  oriented to person, place and time/date  Attention:  Good  Concentration:  Good  Memory:  WNL  Fund of knowledge:   Good  Insight:    Good  Judgment:   Good  Impulse Control:  Good     Risk Assessment: Danger to Self:  No Self-injurious Behavior: No Danger to Others: No Duty to Warn:no Physical Aggression / Violence:No  Access to Firearms a concern: No  Gang Involvement:No   with rejection from a friend today that caused her  Subjective[facility] a tremendous difference. Had a difficult occurrence with the termination of a long friendship today due to poor choices Raynelle FanningJulie made before being diagnosed. Discussed elements of true friendship.   Interventions: Cognitive Behavioral Therapy, Solution-Oriented/Positive Psychology and Interpersonal   Diagnosis:   ICD-10-CM   1. Bipolar II disorder (HCC) F31.81      Plan:   Self care program             Transition to new job              Continue [employer]              Internal locus of control  Ulice Boldarson Cathren Sween, [place]

## 2018-06-13 ENCOUNTER — Ambulatory Visit: Payer: BC Managed Care – PPO | Admitting: Physician Assistant

## 2018-06-13 ENCOUNTER — Encounter: Payer: Self-pay | Admitting: Physician Assistant

## 2018-06-13 DIAGNOSIS — F900 Attention-deficit hyperactivity disorder, predominantly inattentive type: Secondary | ICD-10-CM | POA: Diagnosis not present

## 2018-06-13 DIAGNOSIS — F3160 Bipolar disorder, current episode mixed, unspecified: Secondary | ICD-10-CM

## 2018-06-13 HISTORY — DX: Bipolar disorder, current episode mixed, unspecified: F31.60

## 2018-06-13 HISTORY — DX: Attention-deficit hyperactivity disorder, predominantly inattentive type: F90.0

## 2018-06-13 MED ORDER — BREXPIPRAZOLE 1 MG PO TABS: 1.0000 mg | ORAL_TABLET | ORAL | 1 refills | Status: DC

## 2018-06-13 MED ORDER — AMPHETAMINE-DEXTROAMPHETAMINE 10 MG PO TABS: 10.0000 mg | ORAL_TABLET | Freq: Every day | ORAL | 0 refills | Status: DC

## 2018-06-13 NOTE — Progress Notes (Signed)
[facility]  Patient ID: [name],  MRN: [id number]  PCP: [name]  Date of Evaluation: 06/13/2018 Time spent:15 minutes  Chief Complaint:  Chief Complaint    Follow-up      HISTORY/CURRENT STATUS: HPI  Here for 6 week med check after starting Rexulti.   "I feel like a different person!  The Rexulti is a miracle drug.  Even a week after I started the drug started feeling better.  I do not have the impulsivity like I used to.  I am not buying stuff on [employer].  I will put stuff in my cart but then deleted.  I feel so good."  The only problem she reports is still not being able to focus on things and complete task.  This is been an ongoing problem for many years.  Has never been officially diagnosed with ADD/ADHD but has felt like she has had a for a long time.  Patient denies loss of interest in usual activities and is able to enjoy things.  Denies decreased energy or motivation.  Appetite has not changed.  No extreme sadness, tearfulness, or feelings of hopelessness.  Denies any changes in concentration, making decisions or remembering things.  Denies suicidal or homicidal thoughts.  Patient denies increased energy with decreased need for sleep, no increased talkativeness, no racing thoughts, no impulsivity or risky behaviors, no increased spending, no increased libido, no grandiosity.  Reports some obsessions but no compulsive behaviors.  Sleeps well.  Individual Medical History/ Review of Systems: Changes? :No   Allergies: Oxycodone; Sulfa antibiotics; and Tamiflu [oseltamivir phosphate]  Current Medications:  Current Outpatient Medications:  .  ALPRAZolam (XANAX) 0.5 MG tablet, Take 1 tablet (0.5 mg total) by mouth 2 (two) times daily as needed for anxiety., Disp: 60 tablet, Rfl: 0 .  Brexpiprazole (REXULTI) 1 MG TABS, Take 1 tablet (1 mg total) by mouth every morning., Disp: 30 tablet, Rfl: 1 .  esomeprazole (NEXIUM) 40 MG capsule, Take 40-80  mg by mouth daily as needed (for acid reflux). , Disp: , Rfl:  .  FLUoxetine (PROZAC) 20 MG capsule, Take 1 capsule (20 mg total) by mouth daily. (Patient taking differently: Take 40 mg by mouth daily. ), Disp: 60 capsule, Rfl: 2 .  lisinopril (PRINIVIL,ZESTRIL) 20 MG tablet, Take 20 mg by mouth every evening. , Disp: , Rfl:  Medication Side Effects: none  Family Medical/ Social History: Changes? Yes changed jobs  MENTAL HEALTH EXAM:  There were no vitals taken for this visit.There is no height or weight on file to calculate BMI.  General Appearance: Casual, Well Groomed and Obese  Eye Contact:  Good  Speech:  Clear and Coherent  Volume:  Normal  Mood:  Euthymic  Affect:  Appropriate  Thought Process:  Goal Directed  Orientation:  Full (Time, Place, and Person)  Thought Content: Logical   Suicidal Thoughts:  No  Homicidal Thoughts:  No  Memory:  WNL  Judgement:  Good  Insight:  Good  Psychomotor Activity:  Normal  Concentration:  Concentration: Good  Recall:  Good  Fund of Knowledge: Good  Language: Good  Assets:  Desire for Improvement  ADL's:  Intact  Cognition: WNL  Prognosis:  Good    DIAGNOSES:    ICD-10-CM   1. Bipolar 1 disorder, mixed (HCC) F31.60   2. Attention deficit hyperactivity disorder (ADHD), predominantly inattentive type F90.0     Receiving Psychotherapy: Yes  with Ulice Bold, LPC  RECOMMENDATIONS: Glad to see  her doing so much better!   I believe she has ADD and we will try Adderall.  Watch for manic symptoms however.  We did discuss other treatments for ADD, including clonidine and guanfacine as well as Strattera. Continue Rexulti, Prozac, and she needs to stop the Xanax.  She does not take it routinely anyway, and she will be able to take for emergencies only while on a stimulant.  She understands. Continue psychotherapy with Ulice Boldarson Sarvis, LPC. Return in 4 weeks.    Melony Overlyeresa Mitzi Lilja, PA-C

## 2018-06-19 ENCOUNTER — Ambulatory Visit: Payer: BC Managed Care – PPO | Admitting: Mental Health

## 2018-06-19 ENCOUNTER — Telehealth: Payer: Self-pay

## 2018-06-19 NOTE — Telephone Encounter (Signed)
PA approved on Amphetamine dextroamphetamine 10mg  through 06/14/2021

## 2018-07-03 ENCOUNTER — Encounter: Payer: Self-pay | Admitting: Mental Health

## 2018-07-03 ENCOUNTER — Other Ambulatory Visit: Payer: Self-pay | Admitting: Physician Assistant

## 2018-07-03 ENCOUNTER — Ambulatory Visit: Payer: BC Managed Care – PPO | Admitting: Mental Health

## 2018-07-03 DIAGNOSIS — F319 Bipolar disorder, unspecified: Secondary | ICD-10-CM

## 2018-07-03 DIAGNOSIS — F900 Attention-deficit hyperactivity disorder, predominantly inattentive type: Secondary | ICD-10-CM | POA: Diagnosis not present

## 2018-07-03 MED ORDER — BREXPIPRAZOLE 2 MG PO TABS: 2.0000 mg | ORAL_TABLET | Freq: Every day | ORAL | 1 refills | Status: DC

## 2018-07-03 NOTE — Progress Notes (Signed)
Pt [facility].  Colon BranchCarson reported to me that the patient has had more impulsivity and risky behavior and is not doing as well as she was.  I have advised her to increase Rexulti to 2 mg daily and have sent in a prescription for that.  Patient will see me as soon as she can get in.

## 2018-07-03 NOTE — Progress Notes (Signed)
      [facility] Counselor/Therapist Progress Note  Patient ID: Derald MacleodJulie M Lightsey, MRN: [id number],    Date: 07/03/2018   Time Spent: 45 minutes  Treatment Type: Individual Therapy  Reported Symptoms: Depressed mood, Anxious Mood, Anhedonia, Ruminations, Fatigue, Feelings of worthlessness, hopelessness, Impulsivity and Sleep disturbance  Mental Status Exam:  Appearance:   Casual     Behavior:  risky impulsive  Motor:  Normal  Speech/Language:   Clear and Coherent  Affect:  Negative, Depressed, Tearful and ashamed  Mood:  anxious, depressed, labile and sad  Thought process:  obsessing  Thought content:    Obsessions  Sensory/Perceptual disturbances:    WNL  Orientation:  oriented to person, place and time/date  Attention:  distracted  Concentration:  distracted  Memory:  WNL  Fund of knowledge:   Good  Insight:    Fair  Judgment:   Fair  Impulse Control:  Poor   Risk Assessment: Danger to Self:  No Self-injurious Behavior: No Danger to Others: No Duty to Warn:no Physical Aggression / Violence:No  Access to Firearms a concern: No  Gang Involvement:No   Subjective: Remorse ful response to impulsivity that led to risky behavior.  Interventions: Cognitive Behavioral Therapy, Solution-Oriented/Positive Psychology, Insight-Oriented, Interpersonal and supportive  Diagnosis:   ICD-10-CM   1. Bipolar I disorder (HCC) F31.9   2. Attention deficit hyperactivity disorder (ADHD), predominantly inattentive type F90.0     Plan: Adjust to new job           Self care program           Work/life balance            Marital reconsruction  Ulice Boldarson Maheen Cwikla, [place]

## 2018-07-20 ENCOUNTER — Other Ambulatory Visit: Payer: Self-pay | Admitting: Physician Assistant

## 2018-07-20 ENCOUNTER — Telehealth: Payer: Self-pay | Admitting: Physician Assistant

## 2018-07-20 MED ORDER — AMPHETAMINE-DEXTROAMPHETAMINE 10 MG PO TABS: 10.0000 mg | ORAL_TABLET | Freq: Every day | ORAL | 0 refills | Status: DC

## 2018-07-20 NOTE — Telephone Encounter (Signed)
Please refill amphetamine salts 10mg  to [facility], [address] In [place], [place]. THanks

## 2018-07-24 ENCOUNTER — Ambulatory Visit: Payer: BC Managed Care – PPO | Admitting: Mental Health

## 2018-07-31 ENCOUNTER — Ambulatory Visit: Payer: BC Managed Care – PPO | Admitting: Physician Assistant

## 2018-07-31 ENCOUNTER — Encounter: Payer: Self-pay | Admitting: Physician Assistant

## 2018-07-31 DIAGNOSIS — F319 Bipolar disorder, unspecified: Secondary | ICD-10-CM | POA: Diagnosis not present

## 2018-07-31 DIAGNOSIS — F411 Generalized anxiety disorder: Secondary | ICD-10-CM

## 2018-07-31 DIAGNOSIS — F900 Attention-deficit hyperactivity disorder, predominantly inattentive type: Secondary | ICD-10-CM | POA: Diagnosis not present

## 2018-07-31 MED ORDER — AMPHETAMINE-DEXTROAMPHETAMINE 15 MG PO TABS: 15.0000 mg | ORAL_TABLET | Freq: Every day | ORAL | 0 refills | Status: DC

## 2018-07-31 MED ORDER — BREXPIPRAZOLE 2 MG PO TABS: 2.0000 mg | ORAL_TABLET | Freq: Every day | ORAL | 1 refills | Status: DC

## 2018-07-31 MED ORDER — GABAPENTIN 300 MG PO CAPS: ORAL_CAPSULE | ORAL | 1 refills | Status: DC

## 2018-07-31 MED ORDER — FLUOXETINE HCL 60 MG PO TABS
60.0000 mg | ORAL_TABLET | Freq: Every day | ORAL | 1 refills | Status: DC
Start: 2018-07-31 — End: 2018-08-28

## 2018-07-31 NOTE — Progress Notes (Signed)
[facility]  Patient ID: [name],  MRN: [id number]  PCP: [name]  Date of Evaluation: 07/31/2018 Time spent:25 minutes  Chief Complaint:  Chief Complaint    Anxiety; Depression      HISTORY/CURRENT STATUS: HPI Here for routine med check.   Has a lot of anxiety.  It got much worse after we increased the Rexulti a few weeks back.  She had more impulsive and risky behaviors a few weeks ago and was talking to Katy about it.  We discussed it  Increased the Rexulti.  She's felt much better and without manic sx. Patient denies increased energy with decreased need for sleep, no increased talkativeness, no racing thoughts, no impulsivity or risky behaviors, no increased spending, no increased libido, no grandiosity.  She denies 'crawling out of her skin' or 'not being able to sit still.' Just feels uneasy a lot, even when she first wakes up.  Feels a since of doom.  She drinks about 3-4 cups coffee and 24 oz. Of diet coke daily.   No palpitations, chest tightness, sweats, dizziness.   Adderall works great but she feels that it does not last long enough.  States that attention is good without easy distractibility.  Able to focus on things and finish tasks to completion.  It does wear off by the afternoon.  States she does feel somewhat depressed.  Is mostly just extreme fatigue.  She is able to go to work, she teaches first grade.  She is exhausted when she gets home now.  Motivation is just "okay."  Sleeps well and feels rested when she gets up.  Individual Medical History/ Review of Systems: Changes? :No   Denies tremor, tics, balance or gait problems.  Past medications for mental health diagnoses include: Zoloft, trazodone, Xanax, Prozac, Vraylar,  Allergies: Oxycodone; Sulfa antibiotics; and Tamiflu [oseltamivir phosphate]  Current Medications:  Current Outpatient Medications:  .  ALPRAZolam (XANAX) 0.5 MG tablet, Take 1 tablet (0.5 mg total) by mouth 2  (two) times daily as needed for anxiety., Disp: 60 tablet, Rfl: 0 .  Brexpiprazole (REXULTI) 2 MG TABS, Take 2 mg by mouth daily., Disp: 30 tablet, Rfl: 1 .  esomeprazole (NEXIUM) 40 MG capsule, Take 40-80 mg by mouth daily as needed (for acid reflux). , Disp: , Rfl:  .  lisinopril (PRINIVIL,ZESTRIL) 20 MG tablet, Take 20 mg by mouth every evening. , Disp: , Rfl:  .  amphetamine-dextroamphetamine (ADDERALL) 15 MG tablet, Take 1 tablet by mouth daily., Disp: 30 tablet, Rfl: 0 .  FLUoxetine HCl 60 MG TABS, Take 60 mg by mouth daily., Disp: 30 tablet, Rfl: 1 .  gabapentin (NEURONTIN) 300 MG capsule, 1 qhs for 3 nights, then bid., Disp: 60 capsule, Rfl: 1 Medication Side Effects: none  Family Medical/ Social History: Changes? No  MENTAL HEALTH EXAM:  There were no vitals taken for this visit.There is no height or weight on file to calculate BMI.  General Appearance: Casual, Well Groomed and Obese  Eye Contact:  Good  Speech:  Clear and Coherent  Volume:  Normal  Mood:  Euthymic  Affect:  Appropriate  Thought Process:  Goal Directed  Orientation:  Full (Time, Place, and Person)  Thought Content: Logical   Suicidal Thoughts:  No  Homicidal Thoughts:  No  Memory:  WNL  Judgement:  Good  Insight:  Good  Psychomotor Activity:  Normal  Concentration:  Concentration: Good  Recall:  Good  Fund of Knowledge: Good  Language:  Good  Assets:  Desire for Improvement  ADL's:  Intact  Cognition: WNL  Prognosis:  Good    DIAGNOSES:    ICD-10-CM   1. Bipolar I disorder (HCC) F31.9   2. Attention deficit hyperactivity disorder (ADHD), predominantly inattentive type F90.0   3. Generalized anxiety disorder F41.1     Receiving Psychotherapy: Yes With Ulice Bold, LPC   RECOMMENDATIONS: I spent 25 minutes with the patient and 50% of that time was in counseling reference diagnosis and treatment options. We discussed akathisia but this does not seem to be that.  I think she is having  generalized anxiety.  We discussed caffeine and she needs to at least wean down to half of what she is taking it now.  She understands and will decrease the intake in the afternoon for now.  It does not seem that the Adderall has affected her negatively.  Since it does not seem to be working as well or as long as would like, will increase that.  Discussed different options for the anxiety since she is not able to take a benzo (Xanax just makes her sleepy, plus she is on a stimulant and she is not able to take both a stimulant and benzo at the same time.)  We discussed increasing the Prozac which should help with anxiety plus adding gabapentin at some point.   Increase Adderall to 15 mg p.o. every morning. Increase Prozac to 60 mg p.o. every morning. Start gabapentin 300 mg p.o. nightly for 3 nights and then 1 twice daily. Continue Rexulti 2 mg p.o. every morning. Continue Xanax 0.5 mg for emergencies only. Continue psychotherapy with Ulice Bold, LPC. Return in 4 weeks.   Melony Overly, PA-C

## 2018-08-04 ENCOUNTER — Telehealth: Payer: Self-pay

## 2018-08-04 NOTE — Telephone Encounter (Signed)
Prior Auth approved from [facility] for Dex/Amph 15 Mg 08/01/2018-08/01/2021.

## 2018-08-10 ENCOUNTER — Telehealth: Payer: Self-pay

## 2018-08-10 NOTE — Telephone Encounter (Signed)
Prior Auth approved by [facility] for Dextroamphetamine 15 Mg 08/01/2018-08/01/2021.

## 2018-08-14 ENCOUNTER — Encounter: Payer: Self-pay | Admitting: Mental Health

## 2018-08-14 ENCOUNTER — Ambulatory Visit: Payer: BC Managed Care – PPO | Admitting: Mental Health

## 2018-08-14 DIAGNOSIS — F3181 Bipolar II disorder: Secondary | ICD-10-CM | POA: Diagnosis not present

## 2018-08-14 NOTE — Progress Notes (Signed)
      [facility] Counselor/Therapist Progress Note  Patient ID: WRENLY GILLEAN, MRN: [id number],    Date: 08/14/2018  Time Spent: 45 minutes   Treatment Type: Individual Therapy  Reported Symptoms: Anxious Mood  Mental Status Exam:  Appearance:   Well Groomed     Behavior:  Appropriate and Sharing  Motor:  Normal  Speech/Language:   Normal Rate  Affect:  Appropriate  Mood:  anxious  Thought process:  normal  Thought content:    WNL  Sensory/Perceptual disturbances:    WNL  Orientation:  oriented to person, place and time/date  Attention:  Good  Concentration:  Good  Memory:  WNL  Fund of knowledge:   Good  Insight:    Good  Judgment:   Good  Impulse Control:  Fair   Risk Assessment: Danger to Self:  No Self-injurious Behavior: No Danger to Others: No Duty to Warn:no Physical Aggression / Violence:No  Access to Firearms a concern: No  Gang Involvement:No   Subjective: Relaxed, much improved with medication increase. Enjoying new school job. Has had a little stress. Went on a cruise recently. Had a wonderful time, Still have episodes of anxiety.   Interventions: Cognitive Behavioral Therapy, Solution-Oriented/Positive Psychology, Insight-Oriented, Family Systems and Interpersonal  Diagnosis:   ICD-10-CM   1. Bipolar II disorder (HCC) F31.81     Plan:   Medication compliance             Self care program             Work/life balance             Marital rebuilding             Validation and support  Ulice Bold, [facility]

## 2018-08-14 NOTE — Progress Notes (Deleted)
      [facility] Counselor/Therapist Progress Note  Patient ID: [name], MRN: [id number],    Date: 08/14/2018  Time Spent:   Treatment Type: {CHL AMB THERAPY [phone]}  Reported Symptoms: {CHL AMB [facility] COUNSELOR SYMP LIST:22089}  Mental Status Exam:  Appearance:   {PSY:22683}     Behavior:  {PSY:21022743}  Motor:  {PSY:22302}  Speech/Language:   {PSY:22685}  Affect:  {PSY:22687}  Mood:  {PSY:31886}  Thought process:  {PSY:31888}  Thought content:    {PSY:[id number]}  Sensory/Perceptual disturbances:    {PSY:[phone]}  Orientation:  {PSY:30297}  Attention:  {PSY:22877}  Concentration:  {PSY:212 282 4338}  Memory:  {PSY:3371542852}  Fund of knowledge:   {PSY:212 282 4338}  Insight:    {PSY:212 282 4338}  Judgment:   {PSY:212 282 4338}  Impulse Control:  {PSY:212 282 4338}   Risk Assessment: Danger to Self:  {PSY:22692} Self-injurious Behavior: {PSY:22692} Danger to Others: {PSY:22692} Duty to Warn:{PSY:311194} Physical Aggression / Violence:{PSY:21197} Access to Firearms a concern: {PSY:21197} Gang Involvement:{PSY:21197}  Subjective: ***   Interventions: {[phone]}  Diagnosis:   ICD-10-CM   1. Bipolar II disorder (HCC) F31.81     Plan: ***  Ulice Bold, LPC

## 2018-08-25 ENCOUNTER — Telehealth: Payer: Self-pay | Admitting: Physician Assistant

## 2018-08-25 NOTE — Telephone Encounter (Signed)
Pt called stated she has been taking Prozac for a long time. Recently increased from 40mg  to 60mg   Just not working. Not feeling well at all.

## 2018-08-25 NOTE — Telephone Encounter (Signed)
Feels like she's in a depressive state now, feels very flat. No bad thoughts. The increase has been a couple weeks and she feels it should be working better by now.   Please advise, aware she won't be contacted until Monday 02/10 when provider returns.     Uses [facility].

## 2018-08-25 NOTE — Telephone Encounter (Signed)
Left voice mail to call back 

## 2018-08-25 NOTE — Telephone Encounter (Signed)
Pt is having trouble with her medications. We were talking about it on the phone and then got cut off. I wasn't able to reach her back directly but told her we'd call her back.

## 2018-08-28 ENCOUNTER — Telehealth: Payer: Self-pay

## 2018-08-28 ENCOUNTER — Other Ambulatory Visit: Payer: Self-pay | Admitting: Physician Assistant

## 2018-08-28 ENCOUNTER — Other Ambulatory Visit: Payer: Self-pay

## 2018-08-28 MED ORDER — DULOXETINE HCL 30 MG PO CPEP: 30.0000 mg | ORAL_CAPSULE | Freq: Every day | ORAL | 1 refills | Status: DC

## 2018-08-28 NOTE — Telephone Encounter (Signed)
Anxiety is bad but gabapentin makes her too drowsy during the day. Heart is racing, has cut back on her caffeine intake. Felt lifeless over the weekend, didn't want to get off the couch. Feels horrible. Asking to switch to something else, says Prozac is not working anymore.Didn't even want to go to church. This is not like her and she's begging for something different.  Please advise. Sounds very desperate on phone.

## 2018-08-28 NOTE — Telephone Encounter (Signed)
Pt aware and will submit to her pharmacy in [place]

## 2018-08-28 NOTE — Telephone Encounter (Signed)
It may take a total of 4-6 weeks for the increase to help. Is she better as far as the anxiety/jitteryness goes?  Any manic sx?

## 2018-08-28 NOTE — Telephone Encounter (Signed)
Let's start Cymbalta 30 mg. Stop Prozac.  I'll talk to her at our appt about the med and dosing, etc. I'll pend the  Cymbalta order.

## 2018-08-28 NOTE — Telephone Encounter (Signed)
Error

## 2018-08-31 ENCOUNTER — Ambulatory Visit: Payer: BC Managed Care – PPO | Admitting: Mental Health

## 2018-09-01 ENCOUNTER — Telehealth: Payer: Self-pay | Admitting: Physician Assistant

## 2018-09-01 NOTE — Telephone Encounter (Signed)
Yes she can go up to 60mg . I think she has enough of the 30 mg to double, until her next appt.  But ok to send in Rx for 60mg  #30.  No RF if she needs it.  Has appt 2/19.

## 2018-09-01 NOTE — Telephone Encounter (Signed)
TCUTRLM gave information on answering machine. Requesting pt to call back if needs meds.

## 2018-09-01 NOTE — Telephone Encounter (Signed)
Patient would like to know if she can increase her dosage of Symbalta please advise

## 2018-09-01 NOTE — Telephone Encounter (Signed)
Please advise 

## 2018-09-02 ENCOUNTER — Telehealth: Payer: Self-pay | Admitting: Psychiatry

## 2018-09-02 NOTE — Telephone Encounter (Signed)
Husband Sales executive after hours through [employer] for complaint of severe depression in [name] from [phone], reviewing as he can her recent medication adjustments and that she is not wanting to leave the house or do anything. They did titrate today to 60 mg of Cymbalta as per office nurse yesterday also taking her usual Rexulti 2 mg and Adderall, not taking previous Prozac or gabapentin for loss of efficacy and sedation respectively.  Husband then places patient on the phone confirming that she has no reason to be depressed now except her work is stressful.  She suggest also per Epic her desire to work less but administration requests that she continue as they need her.  She asks if medication should make her better for work, having needed treatment for several years, including inpatient for 3 days last April but not now suicidal or dangerously decompensated in any way.  Therapy note with Ulice Bold documented that she was doing great January 27 after a cruise.  She should continue current medications including Cymbalta 60 mg having no adverse effects but with chief complaint that efficacy after 1 day is not sufficient.  She has a diagnosis of major depression from the hospital and bipolar depression in the office and has been considered for mood stabilizer such as lithium.  Rexulti could be changed to Latuda relative to depression considered bipolar or Depakote or lithium could be considered especially if mixed features considering last therapy session 3 weeks ago feeling very good and happy.  As they are not able to distinguish these symptoms for cause or matching treatment decisions, she agrees to take several days off work encouraging activity out of the house with husband tomorrow such as church and follow up with the office next week whether with Melony Overly, PAc and/or therapy with Ulice Bold, LPC.

## 2018-09-06 ENCOUNTER — Ambulatory Visit: Payer: BC Managed Care – PPO | Admitting: Physician Assistant

## 2018-09-06 ENCOUNTER — Encounter: Payer: Self-pay | Admitting: Physician Assistant

## 2018-09-06 DIAGNOSIS — F3160 Bipolar disorder, current episode mixed, unspecified: Secondary | ICD-10-CM | POA: Diagnosis not present

## 2018-09-06 DIAGNOSIS — F900 Attention-deficit hyperactivity disorder, predominantly inattentive type: Secondary | ICD-10-CM | POA: Diagnosis not present

## 2018-09-06 DIAGNOSIS — F411 Generalized anxiety disorder: Secondary | ICD-10-CM

## 2018-09-06 MED ORDER — DULOXETINE HCL 60 MG PO CPEP: 60.0000 mg | ORAL_CAPSULE | Freq: Every day | ORAL | 1 refills | Status: DC

## 2018-09-06 MED ORDER — HYDROXYZINE HCL 10 MG PO TABS: 5.0000 mg | ORAL_TABLET | Freq: Four times a day (QID) | ORAL | 1 refills | Status: DC | PRN

## 2018-09-06 MED ORDER — CARIPRAZINE HCL 3 MG PO CAPS: 3.0000 mg | ORAL_CAPSULE | Freq: Every day | ORAL | 1 refills | Status: AC

## 2018-09-06 NOTE — Progress Notes (Signed)
[facility]  Patient ID: [name],  MRN: [id number]  PCP: [name]  Date of Evaluation: 09/06/2018 Time spent:25 minutes  Chief Complaint:  Chief Complaint    Follow-up      HISTORY/CURRENT STATUS: HPI Here to discuss worsening depression.  With husband Public house manager.   Since LOV, she got much worse with depression.  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 She was confronted by the principle and given what sounds like constructive criticism, but she couldn't handle it.  She ended up having to leave work that day, went home and went to bed.  She made herself get up and go the next day but her energy and motivation are extremely low, she has not been wanting to wear make-up or jewelry which is something she does.  There are times where she just does not even want to take a shower.  [name] says that he is trying to get her up to do things.  He encourages her to not stay in bed and she needs to get up and exercise and such.  Her husband gave her Xanax a couple of times this past weekend which did seem to help a lot.  She has been extremely cautious about taking it because she is on the Adderall and we have discussed not taking both together.  At the last visit, she reported a lot of anxiety.  At that point we increased the Prozac.  However she has needed to call us several times and even called the doctor on call last weekend with increased anxiety.  During those phone calls, I stopped the Prozac and started her on Cymbalta which was increased then to 60 mg 1 week after being on it.  She states she does not feel much better yet, although she is not as bad as she was this past weekend when the anxiety was really bad.  She thinks she has more depression and her husband thinks it is more anxiety.  Patient denies increased energy with decreased need for sleep, no increased talkativeness, no racing thoughts, no impulsivity or risky behaviors, no increased spending, no increased libido, no  grandiosity.  Denies muscle or joint pain, stiffness, or dystonia.  Denies dizziness, syncope, seizures, numbness, tingling, tremor, tics, unsteady gait, slurred speech, confusion.   Individual Medical History/ Review of Systems: Changes? :No    Past medications for mental health diagnoses include: Zoloft, trazodone, Xanax, Prozac, Vraylar, Cymbalta, Rexulti  Allergies: Oxycodone; Sulfa antibiotics; and Tamiflu [oseltamivir phosphate]  Current Medications:  Current Outpatient Medications:  .  ALPRAZolam (XANAX) 0.5 MG tablet, Take 1 tablet (0.5 mg total) by mouth 2 (two) times daily as needed for anxiety., Disp: 60 tablet, Rfl: 0 .  amphetamine-dextroamphetamine (ADDERALL) 15 MG tablet, Take 1 tablet by mouth daily., Disp: 30 tablet, Rfl: 0 .  DULoxetine (CYMBALTA) 30 MG capsule, Take 1 capsule (30 mg total) by mouth daily. (Patient taking differently: Take 60 mg by mouth daily. ), Disp: 30 capsule, Rfl: 1 .  esomeprazole (NEXIUM) 40 MG capsule, Take 40-80 mg by mouth daily as needed (for acid reflux). , Disp: , Rfl:  .  lisinopril (PRINIVIL,ZESTRIL) 20 MG tablet, Take 20 mg by mouth every evening. , Disp: , Rfl:  .  cariprazine (VRAYLAR) capsule, Take 1 capsule (3 mg total) by mouth daily for 30 days., Disp: 30 capsule, Rfl: 1 .  DULoxetine (CYMBALTA) 60 MG capsule, Take 1 capsule (60 mg total) by mouth daily., Disp: 30 capsule, Rfl: 1 .  hydrOXYzine (ATARAX/VISTARIL) 10 MG tablet, Take 0.5-2 tablets (5-20 mg total) by mouth every 6 (six) hours as needed., Disp: 60 tablet, Rfl: 1 Medication Side Effects: hypersomnolence with gabapentin which she has stopped.  Family Medical/ Social History: Changes? 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  MENTAL HEALTH EXAM:  There were no vitals taken for this visit.There is no height or weight on file to calculate BMI.  General Appearance: Casual, Well Groomed and Obese  Eye Contact:  Good  Speech:   Clear and Coherent  Volume:  Normal  Mood:  Euthymic  Affect:  Appropriate  Thought Process:  Goal Directed  Orientation:  Full (Time, Place, and Person)  Thought Content: Logical   Suicidal Thoughts:  No  Homicidal Thoughts:  No  Memory:  WNL  Judgement:  Good  Insight:  Good  Psychomotor Activity:  Normal  Concentration:  Concentration: Good and Attention Span: Good  Recall:  Good  Fund of Knowledge: Good  Language: Good  Assets:  Desire for Improvement  ADL's:  Intact  Cognition: WNL  Prognosis:  Good    DIAGNOSES:    ICD-10-CM   1. Bipolar 1 disorder, mixed (HCC) F31.60   2. Attention deficit hyperactivity disorder (ADHD), predominantly inattentive type F90.0   3. Generalized anxiety disorder F41.1     Receiving Psychotherapy: Yes With Ulice Bold, LPC.   RECOMMENDATIONS: I spent 25 minutes with the patient and her husband and 50% of that time was spent in counseling concerning anxiety, depression, and bipolar disorder. DC Rexulti. Start Vraylar 3 milligrams p.o. daily.  2 weeks worth of samples plus prescription has been sent in along with a co-pay card given to the patient. Start hydroxyzine 10 mg tablets, 1/2 to 2 pills every 6 hours as needed severe anxiety.  It is okay for her to take this in the same day that she is taking the Adderall.  I again explained the reason Xanax is not a good idea to take while taking a stimulant.  She can use the Xanax in emergencies however. Continue Xanax as noted above. Continue Cymbalta 60 mg p.o. every morning. Stop gabapentin due to hypersomnolence Continue Adderall 15 mg p.o. every morning.  She is not usually taking it on weekends. Continue psychotherapy with Ulice Bold, LPC. Return in 4 weeks.   Melony Overly, PA-C

## 2018-09-08 ENCOUNTER — Telehealth: Payer: Self-pay

## 2018-09-08 NOTE — Telephone Encounter (Signed)
Prior authorization submitted for Vraylar 3mg  with [facility] [employer], approved effective 09/07/2018-02/20/20203   Faxed to [employer] [phone] fax

## 2018-09-11 ENCOUNTER — Ambulatory Visit: Payer: BC Managed Care – PPO | Admitting: Mental Health

## 2018-09-11 ENCOUNTER — Encounter: Payer: Self-pay | Admitting: Mental Health

## 2018-09-11 DIAGNOSIS — F3181 Bipolar II disorder: Secondary | ICD-10-CM

## 2018-09-11 NOTE — Progress Notes (Signed)
      [facility] Counselor/Therapist Progress Note  Patient ID: JENETT MOWDY, MRN: [id number],    Date: 09/11/2018  Time Spent: 45 minutes  Treatment Type: Individual Therapy  Reported Symptoms: Depressed and anxious about management style of the new principal who gave her a very hostile evaluation. Had nly received very positive evaluations before and wonders whether the school choice was a mistake. Some fear of new principal. Uncomfortable around her.  Mental Status Exam:  Appearance:   Well Groomed     Behavior:  Sharing and Agitated  Motor:  Normal  Speech/Language:   Normal Rate  Affect:  Depressed and fearful  Mood:  anxious, depressed, irritable and sad  Thought process:  goal directed  Thought content:    Rumination  Sensory/Perceptual disturbances:    WNL  Orientation:  oriented to person, place and time/date  Attention:  Good  Concentration:  Good  Memory:  WNL  Fund of knowledge:   Good  Insight:    Good  Judgment:   Good  Impulse Control:  Good   Risk Assessment: Danger to Self:  No Self-injurious Behavior: No Danger to Others: No Duty to Warn:no Physical Aggression / Violence:No  Access to Firearms a concern: No  Gang Involvement:No   Subjective:    Interventions: Cognitive Behavioral Therapy, Solution-Oriented/Positive Psychology, Insight-Oriented and Interpersonal  Diagnosis:   ICD-10-CM   1. Bipolar II disorder (HCC) F31.81     Plan: Medication compliance           Continue progress in marriage           Self care program           Work/Life balance              Ulice Bold, [facility]

## 2018-09-19 ENCOUNTER — Telehealth: Payer: Self-pay | Admitting: Physician Assistant

## 2018-09-19 ENCOUNTER — Other Ambulatory Visit: Payer: Self-pay | Admitting: Physician Assistant

## 2018-09-19 MED ORDER — AMPHETAMINE-DEXTROAMPHETAMINE 15 MG PO TABS
15.0000 mg | ORAL_TABLET | Freq: Every day | ORAL | 0 refills | Status: DC
Start: 2018-09-19 — End: 2018-10-11

## 2018-09-19 NOTE — Telephone Encounter (Signed)
Patient called and said that she needs anew script of her adderrall 15 mg script [facility] in [place] [address]. Next appt is on 3/19

## 2018-10-05 ENCOUNTER — Encounter: Payer: Self-pay | Admitting: Mental Health

## 2018-10-05 ENCOUNTER — Other Ambulatory Visit: Payer: Self-pay

## 2018-10-05 ENCOUNTER — Ambulatory Visit: Payer: BC Managed Care – PPO | Admitting: Mental Health

## 2018-10-05 DIAGNOSIS — F3181 Bipolar II disorder: Secondary | ICD-10-CM | POA: Diagnosis not present

## 2018-10-05 NOTE — Progress Notes (Signed)
[facility] Counselor/Therapist Progress Note  Patient ID: HA REINKING, MRN: [id number],    Date: 10/05/2018  Time Spent: 45 minutes  Treatment Type: Individual Therapy  Reported Symptoms:  Edgy, nervous, agitated. 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  Mental Status Exam:  Appearance:   Casual     Behavior:  Appropriate, Sharing and Agitated  Motor:  Restlestness  Speech/Language:   Clear and Coherent  Affect:  Full Range  Mood:  anxious and depressed  Thought process:  normal  Thought content:    WNL  Sensory/Perceptual disturbances:    WNL  Orientation:  oriented to person, place and time/date  Attention:  Good  Concentration:  Good  Memory:  WNL  Fund of knowledge:   Good  Insight:    Good  Judgment:   Good  Impulse Control:  Good   Risk Assessment: Danger to Self:  No Self-injurious Behavior: No Danger to Others: No Duty to Warn:no Physical Aggression / Violence:No  Access to Firearms a concern: No  Gang Involvement:No   Subjective:  "I fel like I am in limbo." 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 Has been going through trials. Feels that she is a much better teacher. Husband is supportive. Had one period of instability that lasted about four days. Medication compliant.  Interventions: Cognitive Behavioral Therapy, Solution-Oriented/Positive Psychology, Ego-Supportive, Insight-Oriented and Interpersonal  Diagnosis:   ICD-10-CM   1. Bipolar II disorder [facility]) F31.81       Treatment Plan   Patient Name:  Brely Gatzemeyer   Date: October 05, 2018   Didactic topic to be discussed:           Anxiety:                   Locus of control                              Work/Life balance           Depression                             Problem-solving                              Relationships                                   Boundaries                                      Coping srategies                             Communication                    Recovery from trauma                    Self-care  Validation  Other     Goals:  Patient  1. Maintains mood stabiity:  decreased symptoms of     depression     anxiety  2.   Practices pro-active self-care:   restful sleep, nutrition, exercise, socialization  3.   Effective utilizes boundaries and sets limits  4.   Utliizes coping strategies and problem solving techniques for stress management  5.   Feels accurately heard, understood and validated  Other      Tenny Craw [facility]

## 2018-10-08 ENCOUNTER — Other Ambulatory Visit: Payer: Self-pay | Admitting: Physician Assistant

## 2018-10-11 ENCOUNTER — Ambulatory Visit (INDEPENDENT_AMBULATORY_CARE_PROVIDER_SITE_OTHER): Payer: BC Managed Care – PPO | Admitting: Physician Assistant

## 2018-10-11 ENCOUNTER — Encounter: Payer: Self-pay | Admitting: Physician Assistant

## 2018-10-11 ENCOUNTER — Other Ambulatory Visit: Payer: Self-pay

## 2018-10-11 DIAGNOSIS — F900 Attention-deficit hyperactivity disorder, predominantly inattentive type: Secondary | ICD-10-CM | POA: Diagnosis not present

## 2018-10-11 DIAGNOSIS — F3160 Bipolar disorder, current episode mixed, unspecified: Secondary | ICD-10-CM | POA: Diagnosis not present

## 2018-10-11 DIAGNOSIS — F411 Generalized anxiety disorder: Secondary | ICD-10-CM

## 2018-10-11 MED ORDER — DULOXETINE HCL 30 MG PO CPEP: 30.0000 mg | ORAL_CAPSULE | Freq: Every day | ORAL | 0 refills | Status: DC

## 2018-10-11 MED ORDER — AMPHETAMINE-DEXTROAMPHETAMINE 15 MG PO TABS: 15.0000 mg | ORAL_TABLET | Freq: Every day | ORAL | 0 refills | Status: DC

## 2018-10-11 MED ORDER — CARIPRAZINE HCL 3 MG PO CAPS: 3.0000 mg | ORAL_CAPSULE | Freq: Every day | ORAL | 0 refills | Status: DC

## 2018-10-11 NOTE — Progress Notes (Signed)
[facility] Med Check  Patient ID: [name],  MRN: [id number]  PCP: [name]  Date of Evaluation: 10/11/2018 Time spent:15 minutes  Chief Complaint:  Chief Complaint    Follow-up     Virtual Visit via Telephone Note  I connected with ADLYN OSORNO on 10/11/18 at  4:30 PM EDT by telephone and verified that I am speaking with the correct person using two identifiers.   I discussed the limitations, risks, security and privacy concerns of performing an evaluation and management service by telephone and the availability of in person appointments. I also discussed with the patient that there may be a patient responsible charge related to this service. The patient expressed understanding and agreed to proceed.   HISTORY/CURRENT STATUS: HPI for routine med check.  Patient was seen approximately 6 weeks ago.  She was having worsening of depression at that point.  We stopped the Rexulti and started Vraylar partly due to financial issues.  We also added hydroxyzine for anxiety.  Patient states she is doing a lot better.  She still feels some depression and wonders if the Cymbalta could be increased.  The depression comes and goes and usually only last for a day or 2 when it does occur.  She will feel kind of "blue" and does not really want to do anything except sit on the couch.  She does make herself do things though.  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  She is having to be self disciplined to do those things.  She feels well enough to do them but it is exhausting and she feels that if she had more motivation and energy it would not be as difficult.  She denies crying easily.  She is not isolating except for the quarantine being from COVID-19.  Anxiety is much better.  Patient denies increased energy with decreased need for sleep, no increased talkativeness, no racing  thoughts, no impulsivity or risky behaviors, no increased spending, no increased libido, no grandiosity.  Feels that the Adderall is working well and she is able to focus and finish tasks easier.  Denies muscle or joint pain, stiffness, or dystonia.  Denies dizziness, syncope, seizures, numbness, tingling, tremor, tics, unsteady gait, slurred speech, confusion.   Individual Medical History/ Review of Systems: Changes? :No    Past medications for mental health diagnoses include: Zoloft, trazodone, Xanax, Prozac, Vraylar, Cymbalta, Rexulti  Allergies: Oxycodone; Sulfa antibiotics; and Tamiflu [oseltamivir phosphate]  Current Medications:  Current Outpatient Medications:  .  ALPRAZolam (XANAX) 0.5 MG tablet, Take 1 tablet (0.5 mg total) by mouth 2 (two) times daily as needed for anxiety., Disp: 60 tablet, Rfl: 0 .  [START ON 10/19/2018] amphetamine-dextroamphetamine (ADDERALL) 15 MG tablet, Take 1 tablet by mouth daily., Disp: 30 tablet, Rfl: 0 .  cariprazine (VRAYLAR) capsule, Take 1 capsule (3 mg total) by mouth daily., Disp: 90 capsule, Rfl: 0 .  DULoxetine (CYMBALTA) 60 MG capsule, TAKE 1 CAPSULE(60 MG) BY MOUTH DAILY, Disp: 30 capsule, Rfl: 1 .  esomeprazole (NEXIUM) 40 MG capsule, Take 40-80 mg by mouth daily as needed (for acid reflux). , Disp: , Rfl:  .  hydrOXYzine (ATARAX/VISTARIL) 10 MG tablet, Take 0.5-2 tablets (5-20 mg total) by mouth every 6 (six) hours as needed., Disp: 60 tablet, Rfl: 1 .  lisinopril (PRINIVIL,ZESTRIL) 20 MG tablet, Take 20 mg by mouth every evening. ,  Disp: , Rfl:  .  [START ON 11/17/2018] amphetamine-dextroamphetamine (ADDERALL) 15 MG tablet, Take 1 tablet by mouth daily., Disp: 30 tablet, Rfl: 0 .  [START ON 12/16/2018] amphetamine-dextroamphetamine (ADDERALL) 15 MG tablet, Take 1 tablet by mouth daily., Disp: 30 tablet, Rfl: 0 .  DULoxetine (CYMBALTA) 30 MG capsule, Take 1 capsule (30 mg total) by mouth daily. Take w/ 60mg =90mg , Disp: 90 capsule, Rfl:  0 Medication Side Effects: none  Family Medical/ Social History: Changes? 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  General Appearance: Phone visit and unable to assess  Eye Contact:  Unable to assess  Speech:  Clear and Coherent  Volume:  Normal  Mood:  Euthymic  Affect:  Unable to assess  Thought Process:  Goal Directed  Orientation:  Full (Time, Place, and Person)  Thought Content: Logical   Suicidal Thoughts:  No  Homicidal Thoughts:  No  Memory:  WNL  Judgement:  Good  Insight:  Good  Psychomotor Activity:  Unable to assess  Concentration:  Concentration: Good and Attention Span: Good  Recall:  Good  Fund of Knowledge: Good  Language: Good  Assets:  Desire for Improvement  ADL's:  Intact  Cognition: WNL  Prognosis:  Good    DIAGNOSES:    ICD-10-CM   1. Bipolar 1 disorder, mixed (HCC) F31.60   2. Attention deficit hyperactivity disorder (ADHD), predominantly inattentive type F90.0   3. Generalized anxiety disorder F41.1     Receiving Psychotherapy: Yes    RECOMMENDATIONS: Increase Cymbalta to a total of 90 mg. Continue Xanax 0.5 mg rarely. Continue Adderall 15 mg p.o. daily. Continue Vraylar 3 mg daily. Continue hydroxyzine 10 to 20 mg every 6 hours as needed anxiety Continue psychotherapy with Ulice Bold, LPC. Return in 6 weeks or sooner as needed.  Melony Overly, PA-C   This record has been created using [employer].  Chart creation errors have been sought, but may not always have been located and corrected. Such creation errors do not reflect on the standard of medical care.

## 2018-10-26 ENCOUNTER — Telehealth: Payer: Self-pay | Admitting: Physician Assistant

## 2018-10-26 ENCOUNTER — Other Ambulatory Visit: Payer: Self-pay | Admitting: Physician Assistant

## 2018-10-26 MED ORDER — CARIPRAZINE HCL 4.5 MG PO CAPS: 4.5000 mg | ORAL_CAPSULE | Freq: Every day | ORAL | 1 refills | Status: DC

## 2018-10-26 NOTE — Telephone Encounter (Signed)
Patient is requesting increase on dosage of Vraylar patient doesn't think it's strong enough. Send to [facility] on [address] In [place]

## 2018-10-26 NOTE — Telephone Encounter (Signed)
I notified pt I sent in Rx.

## 2018-10-31 ENCOUNTER — Other Ambulatory Visit: Payer: Self-pay

## 2018-10-31 ENCOUNTER — Ambulatory Visit: Payer: BC Managed Care – PPO | Admitting: Mental Health

## 2018-11-15 ENCOUNTER — Telehealth: Payer: Self-pay | Admitting: Physician Assistant

## 2018-11-15 ENCOUNTER — Other Ambulatory Visit: Payer: Self-pay

## 2018-11-15 MED ORDER — DULOXETINE HCL 60 MG PO CPEP: 60.0000 mg | ORAL_CAPSULE | Freq: Every day | ORAL | 0 refills | Status: DC

## 2018-11-15 NOTE — Telephone Encounter (Signed)
Pt needs duloxtine 60mg  sent to [facility] on [address] in [place].

## 2018-11-15 NOTE — Telephone Encounter (Signed)
Submitted

## 2018-11-20 ENCOUNTER — Other Ambulatory Visit: Payer: Self-pay

## 2018-11-20 ENCOUNTER — Ambulatory Visit: Payer: BC Managed Care – PPO | Admitting: Physician Assistant

## 2018-11-20 ENCOUNTER — Encounter: Payer: Self-pay | Admitting: Mental Health

## 2018-11-20 ENCOUNTER — Ambulatory Visit (INDEPENDENT_AMBULATORY_CARE_PROVIDER_SITE_OTHER): Payer: BC Managed Care – PPO | Admitting: Mental Health

## 2018-11-20 DIAGNOSIS — F319 Bipolar disorder, unspecified: Secondary | ICD-10-CM | POA: Diagnosis not present

## 2018-11-20 NOTE — Progress Notes (Signed)
[facility] Counselor/Therapist Progress Note  Patient ID: [name], MRN: [id number],    Date: 11/20/2018   I connected with patient by a video enabled telemedicine application or telephone, with their informed consent, and verified patient privacy and that I am speaking with the correct person using two identifiers.  I was located at home and patient at home.   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 Misses going to church. Family is doing well. Has some depressed days. Reports no mania. Feels medication needs to be adjusted. Has TH appt this afternoon.  Mental Status Exam:  Appearance:   unseen     Behavior:  Appropriate and Sharing  Motor:  Normal  Speech/Language:   Clear and Coherent  Affect:  Appropriate but sometimes downcast  Mood:  labile  Thought process:  preoccupied  Thought content:    WNL  Sensory/Perceptual disturbances:    WNL  Orientation:  oriented to person, place and time/date  Attention:  Good  Concentration:  Good  Memory:  WNL  Fund of knowledge:   Good  Insight:    Good  Judgment:   Good  Impulse Control:  Good   Risk Assessment: Danger to Self:  No Self-injurious Behavior: No Danger to Others: No Duty to Warn:no Physical Aggression / Violence:No  Access to Firearms a concern: No  Gang Involvement:No   Subjective:  Marital satisfaction has improved. Going to beach house this weekend. Staying busy with online teaching. Some days does not feel like getting out of bed - feeling down. But most days is able to stay focused and motivated. 46 year old son is at home. Increased Omega 3 and purchased bicycle to exercise.  Interventions: Solution-Oriented/Positive Psychology, interpersonal, supportive  Diagnosis:   ICD-10-CM   1. Bipolar II disorder [facility]) F31.81       Treatment Plan   Patient Name: [name]   Date: Nov 20, 2018   Didactic topic to be discussed:           Anxiety:                   Locus of control                              Work/Life balance           Depression                             Problem-solving                              Relationships                                   Boundaries                                     Coping srategies                             Communication  Recovery from trauma                    Self-care                                     Validation  Other     Goals:  Patient  1. Maintains mood stabiity:  decreased symptoms of     depression     anxiety  2.   Practices pro-active self-care:   restful sleep, nutrition, exercise, socialization  3.   Effective utilizes boundaries and sets limits  4.   Utliizes coping strategies and problem solving techniques for stress management  5.   Feels accurately heard, understood and validated  Other      Logan Bores Bryant, [facility]

## 2018-11-29 ENCOUNTER — Ambulatory Visit (INDEPENDENT_AMBULATORY_CARE_PROVIDER_SITE_OTHER): Payer: BC Managed Care – PPO | Admitting: Physician Assistant

## 2018-11-29 ENCOUNTER — Encounter: Payer: Self-pay | Admitting: Physician Assistant

## 2018-11-29 DIAGNOSIS — F411 Generalized anxiety disorder: Secondary | ICD-10-CM | POA: Diagnosis not present

## 2018-11-29 DIAGNOSIS — F900 Attention-deficit hyperactivity disorder, predominantly inattentive type: Secondary | ICD-10-CM | POA: Diagnosis not present

## 2018-11-29 DIAGNOSIS — F319 Bipolar disorder, unspecified: Secondary | ICD-10-CM | POA: Diagnosis not present

## 2018-11-29 MED ORDER — DULOXETINE HCL 60 MG PO CPEP: 120.0000 mg | ORAL_CAPSULE | Freq: Every day | ORAL | 1 refills | Status: DC

## 2018-11-29 NOTE — Progress Notes (Signed)
[facility] Med Check  Patient ID: [name],  MRN: [id number]  PCP: [name]  Date of Evaluation: 11/29/2018 Time spent:15 minutes  Chief Complaint:  Chief Complaint    Follow-up     Virtual Visit via Telephone Note  I connected with patient by a video enabled telemedicine application or telephone, with their informed consent, and verified patient privacy and that I am speaking with the correct person using two identifiers.  I am private, at [place],  and the patient is at home.   I discussed the limitations, risks, security and privacy concerns of performing an evaluation and management service by telephone and the availability of in person appointments. I also discussed with the patient that there may be a patient responsible charge related to this service. The patient expressed understanding and agreed to proceed.   I discussed the assessment and treatment plan with the patient. The patient was provided an opportunity to ask questions and all were answered. The patient agreed with the plan and demonstrated an understanding of the instructions.   The patient was advised to call back or seek an in-person evaluation if the symptoms worsen or if the condition fails to improve as anticipated.  I provided 15 minutes of non-face-to-face time during this encounter.  HISTORY/CURRENT STATUS: HPI For 6 week med check.  Has one day approximately once a week, where she is more down and depressed.  There is no known reason.  Dates she feels better in all other ways.  She denies any manic symptoms such as increased energy with decreased need for sleep, no increased libido or spending, no risky or impulsive behavior.  No grandiosity.  When she does feel depressed, she wants to lay around all day.  She does not have anything that is her joy during those times.  Her energy and motivation are low.  Then usually the next day her mood will go back to baseline.  We had increased  Cymbalta at the last visit.  She states it has helped a little bit but just not enough.    The anxiety is well controlled and she rarely needs the Xanax.  She does take the hydroxyzine as needed.  She usually sleeps pretty well.  Denies dizziness, syncope, seizures, numbness, tingling, tremor, tics, unsteady gait, slurred speech, confusion. Denies muscle or joint pain, stiffness, or dystonia.  States that attention is good without easy distractibility.  Able to focus on things and finish tasks to completion.    Individual Medical History/ Review of Systems: Changes? :No    Past medications for mental health diagnoses include: Zoloft, trazodone, Xanax, Prozac, Vraylar, Cymbalta, Rexulti  Allergies: Oxycodone; Sulfa antibiotics; and Tamiflu [oseltamivir phosphate]  Current Medications:  Current Outpatient Medications:  .  ALPRAZolam (XANAX) 0.5 MG tablet, Take 1 tablet (0.5 mg total) by mouth 2 (two) times daily as needed for anxiety., Disp: 60 tablet, Rfl: 0 .  amphetamine-dextroamphetamine (ADDERALL) 15 MG tablet, Take 1 tablet by mouth daily., Disp: 30 tablet, Rfl: 0 .  amphetamine-dextroamphetamine (ADDERALL) 15 MG tablet, Take 1 tablet by mouth daily., Disp: 30 tablet, Rfl: 0 .  [START ON 12/16/2018] amphetamine-dextroamphetamine (ADDERALL) 15 MG tablet, Take 1 tablet by mouth daily., Disp: 30 tablet, Rfl: 0 .  Cariprazine HCl (VRAYLAR) 4.5 MG CAPS, Take 1 capsule (4.5 mg total) by mouth daily., Disp: 30 capsule, Rfl: 1 .  esomeprazole (NEXIUM) 40 MG capsule, Take 40-80 mg by mouth daily as needed (for acid reflux). , Disp: , Rfl:  .  hydrOXYzine (ATARAX/VISTARIL) 10 MG tablet, Take 0.5-2 tablets (5-20 mg total) by mouth every 6 (six) hours as needed., Disp: 60 tablet, Rfl: 1 .  lisinopril (PRINIVIL,ZESTRIL) 20 MG tablet, Take 20 mg by mouth every evening. , Disp: , Rfl:  .  DULoxetine (CYMBALTA) 60 MG capsule, Take 2 capsules (120 mg total) by mouth daily., Disp: 60 capsule, Rfl:  1 Medication Side Effects: none  Family Medical/ Social History: Changes? Yes working from home. B/c coronavirus.  MENTAL HEALTH EXAM:  There were no vitals taken for this visit.There is no height or weight on file to calculate BMI.  General Appearance: Unable to assess  Eye Contact:  Unable to assess  Speech:  Clear and Coherent  Volume:  Normal  Mood:  Euthymic  Affect:  Unable to assess  Thought Process:  Goal Directed  Orientation:  Full (Time, Place, and Person)  Thought Content: Logical   Suicidal Thoughts:  No  Homicidal Thoughts:  No  Memory:  WNL  Judgement:  Good  Insight:  Good  Psychomotor Activity:  Unable to assess  Concentration:  Concentration: Good and Attention Span: Good  Recall:  Good  Fund of Knowledge: Good  Language: Good  Assets:  Desire for Improvement  ADL's:  Intact  Cognition: WNL  Prognosis:  Good    DIAGNOSES:    ICD-10-CM   1. Bipolar I disorder (HCC) F31.9   2. Attention deficit hyperactivity disorder (ADHD), predominantly inattentive type F90.0   3. Generalized anxiety disorder F41.1     Receiving Psychotherapy: Yes With Ulice Boldarson Sarvis, LPC.  RECOMMENDATIONS: We discussed different options.  I recommend Increase Cymbalta to 120 mg daily. Continue hydroxyzine 10 mg 1-2 every 6 hours as needed anxiety. Continue Vraylar 4.5 mg daily. Continue Adderall 15 mg every morning. Continue psychotherapy with Ulice Boldarson Sarvis, LPC. Return in 1 month.  Melony Overlyeresa Hurst, PA-C   This record has been created using [employer] software.  Chart creation errors have been sought, but may not always have been located and corrected. Such creation errors do not reflect on the standard of medical care.

## 2018-12-05 ENCOUNTER — Other Ambulatory Visit: Payer: Self-pay

## 2018-12-05 ENCOUNTER — Ambulatory Visit: Payer: BC Managed Care – PPO | Admitting: Mental Health

## 2018-12-24 ENCOUNTER — Other Ambulatory Visit: Payer: Self-pay | Admitting: Physician Assistant

## 2019-02-09 ENCOUNTER — Other Ambulatory Visit: Payer: Self-pay

## 2019-02-09 ENCOUNTER — Telehealth: Payer: Self-pay | Admitting: Physician Assistant

## 2019-02-09 MED ORDER — AMPHETAMINE-DEXTROAMPHETAMINE 15 MG PO TABS: 15.0000 mg | ORAL_TABLET | Freq: Every day | ORAL | 0 refills | Status: DC

## 2019-02-09 NOTE — Telephone Encounter (Signed)
Pended for approval.

## 2019-02-09 NOTE — Telephone Encounter (Signed)
Pt needs refill on Adderall 15mg  sent to [facility] on [address] in [place]. Pt has appt in Sept.

## 2019-02-17 ENCOUNTER — Other Ambulatory Visit: Payer: Self-pay | Admitting: Physician Assistant

## 2019-02-25 ENCOUNTER — Other Ambulatory Visit: Payer: Self-pay | Admitting: Physician Assistant

## 2019-02-27 ENCOUNTER — Other Ambulatory Visit: Payer: Self-pay

## 2019-02-27 ENCOUNTER — Telehealth: Payer: Self-pay | Admitting: Physician Assistant

## 2019-02-27 MED ORDER — VRAYLAR 4.5 MG PO CAPS: 4.5000 mg | ORAL_CAPSULE | Freq: Every day | ORAL | 1 refills | Status: DC

## 2019-02-27 MED ORDER — DULOXETINE HCL 60 MG PO CPEP: 120.0000 mg | ORAL_CAPSULE | Freq: Every day | ORAL | 1 refills | Status: DC

## 2019-02-27 NOTE — Telephone Encounter (Signed)
Refills sent

## 2019-02-27 NOTE — Telephone Encounter (Signed)
Patient called and said that she has an appt on 9/11 with teresa but she needs refills on her cymbalta 60 mg and her vraylar 3 mg to be sent to the pharmacy [facility] [address]. She was told to make an appt to gget these meds and so she needs this meds

## 2019-03-05 ENCOUNTER — Telehealth: Payer: Self-pay | Admitting: Physician Assistant

## 2019-03-05 ENCOUNTER — Other Ambulatory Visit: Payer: Self-pay

## 2019-03-05 MED ORDER — VRAYLAR 6 MG PO CAPS: 6.0000 mg | ORAL_CAPSULE | Freq: Every day | ORAL | 1 refills | Status: DC

## 2019-03-05 NOTE — Telephone Encounter (Signed)
[name] has been trying to get her 3mg  Vraylar filled since last week.  We filled the 4.5mg  but not the 3mg .  She says she takes both strengths for a total of 7.5mg .  She has appt 9/11, nothing is available prior to that date.  She is not sure why she can't get a refill on the 3mg .  Please check into this and get a prescription sent in so she can continue her medication as prescribed.  If there is a problem, please call her to discuss.

## 2019-03-05 NOTE — Telephone Encounter (Signed)
You're right!  On 10/26/18 phone note, I increased FROM 3mg  TO 4.5 mg.  She SHOULD NOT be on both!  Unless she's been taking 3 mg from old bottle, she should only have 4.5 mg pills.  If she has been doing that, we need to decrease to 6 mg, which is the max dose accepted.  I'll send in if needed.

## 2019-03-05 NOTE — Telephone Encounter (Signed)
Thanks

## 2019-03-08 ENCOUNTER — Other Ambulatory Visit: Payer: Self-pay | Admitting: Physician Assistant

## 2019-03-15 ENCOUNTER — Telehealth: Payer: Self-pay | Admitting: Physician Assistant

## 2019-03-15 NOTE — Telephone Encounter (Signed)
Patient verbalized understanding and will call back next week if not feeling better

## 2019-03-15 NOTE — Telephone Encounter (Signed)
Pt husband Marlou Sa called, Stated his wife is not doing well. In a cycle. Ask for appt with LPC  (Was Pt of [place]) Has appt with HI 9/4. If T.Hurst has any advise and or med change info please advise. Call home # [phone] Pt will be there and someone will be with her.

## 2019-03-15 NOTE — Telephone Encounter (Signed)
Have her stay on the 6 mg, and give it another week or 2 for her brain chemistry to readjust. Reassure her that it will, but if no better by middle of next week, call back.  Hang in there!

## 2019-03-16 ENCOUNTER — Telehealth: Payer: Self-pay | Admitting: Physician Assistant

## 2019-03-16 ENCOUNTER — Other Ambulatory Visit: Payer: Self-pay

## 2019-03-16 MED ORDER — CARIPRAZINE HCL 1.5 MG PO CAPS: 1.5000 mg | ORAL_CAPSULE | Freq: Every day | ORAL | 0 refills | Status: DC

## 2019-03-16 NOTE — Telephone Encounter (Signed)
Not sure if this is what she is asking for but if it is sexual addiction groups there are sex addicts anonymous groups and other similar groups available for support.  There are also bipolar support groups locally including the [place] as a similar group.

## 2019-03-16 NOTE — Telephone Encounter (Signed)
Reviewed

## 2019-03-16 NOTE — Telephone Encounter (Signed)
[name], if they still have the pills in order to take Vraylar 3mg  + the 4.5 mg, go ahead and increase it now.  If they don't, send in whatever she needs.  Ins may not pay (although it did before, by mistake).  They can go to the office and get samples of 1.5 mg to take w/ the 6 mg daily, until I see them, and then we'll decide another plan.

## 2019-03-16 NOTE — Telephone Encounter (Signed)
error 

## 2019-03-16 NOTE — Telephone Encounter (Signed)
Patient's husband Linna Hoff called stated patient having episodes at the point where she doesn't want to go to the hospital, I advised Linna Hoff that Helene Kelp is out of the office today, he is requesting a emergency visit, the mania from the Bipolar not getting better been going on for 8 days cannot leave the house or work please contact Dan at [phone]

## 2019-03-20 NOTE — Telephone Encounter (Signed)
Left voicemail to follow up and check on how [name] is feeling this week.

## 2019-03-23 ENCOUNTER — Ambulatory Visit (INDEPENDENT_AMBULATORY_CARE_PROVIDER_SITE_OTHER): Payer: BC Managed Care – PPO | Admitting: Psychiatry

## 2019-03-23 ENCOUNTER — Encounter: Payer: Self-pay | Admitting: Psychiatry

## 2019-03-23 ENCOUNTER — Other Ambulatory Visit: Payer: Self-pay

## 2019-03-23 DIAGNOSIS — F3181 Bipolar II disorder: Secondary | ICD-10-CM

## 2019-03-23 NOTE — Progress Notes (Signed)
[facility] Counselor/Therapist Progress Note  Patient ID: [name], MRN: [id number],    Date: 03/23/2019  Time Spent: 50 minutes start time 11:08 AM end time 11:58 AM  Treatment Type: Individual Therapy  Reported Symptoms: impulsive behaviors, sadness, anxiety  Mental Status Exam:  Appearance:   Well Groomed     Behavior:  Appropriate  Motor:  Normal  Speech/Language:   Normal Rate  Affect:  Appropriate  Mood:  anxious  Thought process:  normal  Thought content:    WNL  Sensory/Perceptual disturbances:    WNL  Orientation:  oriented to person, place, time/date and situation  Attention:  Good  Concentration:  Good  Memory:  WNL  Fund of knowledge:   fair  Insight:    fair  Judgment:   Fair  Impulse Control:  Fair   Risk Assessment: Danger to Self:  No Self-injurious Behavior: No Danger to Others: No Duty to Warn:no Physical Aggression / Violence:No  Access to Firearms a concern: No  Gang Involvement:No   Subjective: Patient was present for session.  Patient reported she had been a patient of [name].  She shared she had been working with her because she had impulsive behaviors that left her having contact and affair with a guy from her past.  Patient shared whenever she seems to have a little manic episode she ends up wanting to have contact with him again which leads to difficulties within her marriage.  Patient stated she wants to figure out how to manage those impulsive behaviors appropriately because the depression and the do behaviors are going to cause into her marriage.  Patient shared some of the history of what has gone on over the past several years with her marriage and her behavior.  Patient explained that things were going really well until she moved to a home that is beside her in-laws.  She shared that they moved to the home because it was given to them and they were able to fix it up but it took her away from her friends and her gym where she  used to exercise regularly.  Patient stated that this time she is not having any opportunities to exercise or to socialize with other people and that things have even gotten worse with the coronavirus.  Patient developed treatment plan in session it was not signed due to coronavirus.  After that patient was able to problem solve different ways to work in exercise that would be realistic with her situation.  She was finally able to come up with a plan that she felt she could follow through on.  Patient is to contact a friend at work and they are to try and walk the track right after work on a daily basis.  Agreed to work on more coping skills at next session.  Interventions: Solution-Oriented/Positive Psychology  Diagnosis:   ICD-10-CM   1. Bipolar II disorder ([place])  F31.81     Plan: Patient is to get in touch with a friend at work and set up walking after school on a daily basis with her to start releasing emotions in a more appropriate manner.  Long term goal:Develop the ability to recognize accept, and cope with feelings of depression Short term goal: Utilize behavioral strategies to overcome depression Identify and replace depressive thinking that leads to depressive feelings and actions  [name], [facility]

## 2019-03-30 ENCOUNTER — Encounter: Payer: Self-pay | Admitting: Physician Assistant

## 2019-03-30 ENCOUNTER — Ambulatory Visit (INDEPENDENT_AMBULATORY_CARE_PROVIDER_SITE_OTHER): Payer: BC Managed Care – PPO | Admitting: Physician Assistant

## 2019-03-30 DIAGNOSIS — Z79899 Other long term (current) drug therapy: Secondary | ICD-10-CM

## 2019-03-30 DIAGNOSIS — F411 Generalized anxiety disorder: Secondary | ICD-10-CM

## 2019-03-30 DIAGNOSIS — F3160 Bipolar disorder, current episode mixed, unspecified: Secondary | ICD-10-CM

## 2019-03-30 DIAGNOSIS — F900 Attention-deficit hyperactivity disorder, predominantly inattentive type: Secondary | ICD-10-CM | POA: Diagnosis not present

## 2019-03-30 MED ORDER — LITHIUM CARBONATE 300 MG PO TABS: ORAL_TABLET | ORAL | 1 refills | Status: DC

## 2019-03-30 MED ORDER — AMPHETAMINE-DEXTROAMPHETAMINE 15 MG PO TABS: 15.0000 mg | ORAL_TABLET | Freq: Every day | ORAL | 0 refills | Status: DC

## 2019-03-30 MED ORDER — ALPRAZOLAM 0.5 MG PO TABS: 0.5000 mg | ORAL_TABLET | Freq: Two times a day (BID) | ORAL | 0 refills | Status: DC | PRN

## 2019-03-30 NOTE — Progress Notes (Signed)
[facility] Med Check  Patient ID: [name],  MRN: [id number]  PCP: [name]  Date of Evaluation: 03/30/2019 Time spent:45 minutes  Chief Complaint:  Chief Complaint    Follow-up     Virtual Visit via Telephone Note  I connected with patient by a video enabled telemedicine application or telephone, with their informed consent, and verified patient privacy and that I am speaking with the correct person using two identifiers.  I am private, in my home and the patient is home.   I discussed the limitations, risks, security and privacy concerns of performing an evaluation and management service by telephone and the availability of in person appointments. I also discussed with the patient that there may be a patient responsible charge related to this service. The patient expressed understanding and agreed to proceed.   I discussed the assessment and treatment plan with the patient. The patient was provided an opportunity to ask questions and all were answered. The patient agreed with the plan and demonstrated an understanding of the instructions.   The patient was advised to call back or seek an in-person evaluation if the symptoms worsen or if the condition fails to improve as anticipated.  I provided 45 minutes of non-face-to-face time during this encounter.  HISTORY/CURRENT STATUS: HPI For routine med check.  Since the last visit, we discovered she was taking a higher dose of Vraylar than is recommended. We decreased it, but she then went through a severe depression, missed 3 days of work in a row.  She was unable to get out of bed due to the severe depression and when she did, she could not even drive.  States she felt so lethargic she had trouble with her normal hygiene.  Was not able to enjoy things.  Denies suicidal or homicidal thoughts.  A week or so before the severe depression head, she had a manic phase.  She was very impulsive and contacted the man that  she had a previous affair with.  She had trouble sleeping was not spending more money.  No hallucinations.    She has been able to concentrate. Feels like Adderall is working well.  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  She occasionally has anxiety but not often.  She likes having the Xanax on hand just in case but rarely takes it.  Individual Medical History/ Review of Systems: Changes? :No    Denies fevers, chills, weight loss, or night sweats.  Appetite is normal.  No sore throat, earache, cough, shortness of breath, no abdominal pain, nausea or vomiting, constipation or diarrhea, no GU symptoms.Denies dizziness, syncope, seizures, numbness, tingling, tremor, tics, unsteady gait, slurred speech, confusion. Denies muscle or joint pain, stiffness, or dystonia.  Past medications for mental health diagnoses include: Zoloft, trazodone, Xanax, Prozac, Vraylar, Cymbalta, Rexulti  Allergies: Oxycodone, Sulfa antibiotics, and Tamiflu [oseltamivir phosphate]  Current Medications:  Current Outpatient Medications:  .  ALPRAZolam (XANAX) 0.5 MG tablet, Take 1 tablet (0.5 mg total) by mouth 2 (two) times daily as needed for anxiety., Disp: 30 tablet, Rfl: 0 .  [START ON 06/20/2019] amphetamine-dextroamphetamine (ADDERALL) 15 MG tablet, Take 1 tablet by mouth daily., Disp: 30 tablet, Rfl: 0 .  [START ON 05/22/2019] amphetamine-dextroamphetamine (ADDERALL) 15 MG tablet, Take 1 tablet by mouth daily., Disp: 30 tablet, Rfl: 0 .  [START ON 04/22/2019] amphetamine-dextroamphetamine (ADDERALL) 15 MG tablet, Take 1 tablet by mouth daily., Disp: 30 tablet, Rfl: 0 .  cariprazine (VRAYLAR) capsule, Take 1 capsule (1.5 mg total) by mouth daily., Disp: 30 capsule, Rfl: 0 .  Cariprazine HCl (VRAYLAR) 6 MG CAPS, Take 1 capsule (6 mg total) by mouth daily., Disp: 30 capsule, Rfl: 1 .  DULoxetine (CYMBALTA) 60 MG capsule, Take 2 capsules (120 mg total) by mouth daily., Disp: 60  capsule, Rfl: 1 .  esomeprazole (NEXIUM) 40 MG capsule, Take 40-80 mg by mouth daily as needed (for acid reflux). , Disp: , Rfl:  .  lisinopril (PRINIVIL,ZESTRIL) 20 MG tablet, Take 20 mg by mouth every evening. , Disp: , Rfl:  .  hydrOXYzine (ATARAX/VISTARIL) 10 MG tablet, Take 0.5-2 tablets (5-20 mg total) by mouth every 6 (six) hours as needed. (Patient not taking: Reported on 03/30/2019), Disp: 60 tablet, Rfl: 1 .  lithium 300 MG tablet, 1 po qhs for 3 nights, then 2 qhs., Disp: 60 tablet, Rfl: 1 Medication Side Effects: none  Family Medical/ Social History: Changes? No  MENTAL HEALTH EXAM:  There were no vitals taken for this visit.There is no height or weight on file to calculate BMI.  General Appearance: unable to assess  Eye Contact:  unable to assess  Speech:  Clear and Coherent  Volume:  Normal  Mood:  Euthymic  Affect:  unable to assess  Thought Process:  Goal Directed  Orientation:  Full (Time, Place, and Person)  Thought Content: Logical   Suicidal Thoughts:  No  Homicidal Thoughts:  No  Memory:  WNL  Judgement:  Good  Insight:  Good  Psychomotor Activity:  unable to assess  Concentration:  Concentration: Good and Attention Span: Good  Recall:  Good  Fund of Knowledge: Good  Language: Good  Assets:  Desire for Improvement  ADL's:  Intact  Cognition: WNL  Prognosis:  Good    DIAGNOSES:    ICD-10-CM   1. Bipolar 1 disorder, mixed (HCC)  F31.60   2. Attention deficit hyperactivity disorder (ADHD), predominantly inattentive type  F90.0   3. Generalized anxiety disorder  F41.1   4. Encounter for long-term (current) use of medications  Z79.899 Lithium level    Basic metabolic panel    TSH    Receiving Psychotherapy: Yes  With Renee MeuseHolly Colon, [facility]   RECOMMENDATIONS:  I spent 45 minutes with her and 50% of that time was in counseling concerning differential diagnosis and treatment options. We discussed different medication options.  Particularly either adding  Wellbutrin, which would help give her more energy and help with depression but not help with the impulsivity and other symptoms of the bipolar, rapid cycling.   Another option would be go back to Zoloft, because she did very well on that in the past but thinks she was not on high enough dose.  This is not the best idea for the same reasons as the Wellbutrin. The best option in my opinion is to add lithium.  She has done a lot of reading on it and is willing to try it.  It should help the depression, impulsivity and other bipolar mania symptoms.  We discussed the risks of kidney failure which is not common and I will be monitoring kidney functions regularly. Hypothyroidism can also occur secondary to lithium.  Again, I will be monitoring labs.  We discussed the frequency of labs needed and that anytime the dose of the lithium is changed and will need to follow-up with labs.  She verbalizes understanding and accepts the risks, benefits, and potential side effects. Start lithium 300 mg 1  nightly for 3 nights, then 2 nightly.  She will have labs drawn approximately 10 days after starting lithium, and have drawn in the morning.  She verbalizes understanding. Continue Xanax 0.5 mg 1 twice daily as needed for emergency use only. Continue Adderall 15 mg every morning.  PDMP was reviewed. Continue Vraylar 6 mg +1.5 mg daily, for now.  I hope her insurance will at least allow this for another month or so while we are trying to get the lithium on board and at the appropriate dose.  Then we may be able to back down to 6 mg again. Continue Cymbalta 60 mg, 2 p.o. daily. Labs ordered as above. Continue therapy with [name], [facility]. Return in 4 weeks.  Donnal Moat, PA-C   This record has been created using [employer].  Chart creation errors have been sought, but may not always have been located and corrected. Such creation errors do not reflect on the standard of medical care.

## 2019-04-06 ENCOUNTER — Ambulatory Visit (INDEPENDENT_AMBULATORY_CARE_PROVIDER_SITE_OTHER): Payer: BC Managed Care – PPO | Admitting: Psychiatry

## 2019-04-06 ENCOUNTER — Other Ambulatory Visit: Payer: Self-pay

## 2019-04-06 ENCOUNTER — Ambulatory Visit: Payer: BC Managed Care – PPO | Admitting: Psychiatry

## 2019-04-06 DIAGNOSIS — F3181 Bipolar II disorder: Secondary | ICD-10-CM

## 2019-04-06 NOTE — Progress Notes (Signed)
[facility] Counselor/Therapist Progress Note  Patient ID: [name], MRN: [id number],    Date: 04/06/2019  Time Spent: 52 minutes, start time 12:02 PM end time 12:52 PM Virtual Visit via Telephone Note Connected with patient by a video enabled telemedicine/telehealth application or telephone, with their informed consent, and verified patient privacy and that I am speaking with the correct person using two identifiers. I discussed the limitations, risks, security and privacy concerns of performing psychotherapy and management service by telephone and the availability of in person appointments. I also discussed with the patient that there may be a patient responsible charge related to this service. The patient expressed understanding and agreed to proceed. I discussed the treatment planning with the patient. The patient was provided an opportunity to ask questions and all were answered. The patient agreed with the plan and demonstrated an understanding of the instructions. The patient was advised to call  our office if  symptoms worsen or feel they are in a crisis state and need immediate contact.   Therapist Location: Office Patient Location: home    Treatment Type: Individual Therapy  Reported Symptoms: problems functioning, impulsive behaviors, anxiety, panic, sadness, guilt, poor hygeine  Mental Status Exam:  Appearance:   NA     Behavior:  Sharing  Motor:  NA  Speech/Language:   Normal Rate  Affect:  NA  Mood:  anxious and sad  Thought process:  circumstantial  Thought content:    WNL  Sensory/Perceptual disturbances:    WNL  Orientation:  oriented to person, place, time/date and situation  Attention:  Good  Concentration:  Good  Memory:  WNL  Fund of knowledge:   Good  Insight:    Good  Judgment:   Good  Impulse Control:  Good   Risk Assessment: Danger to Self:  No Self-injurious Behavior: No Danger to Others: No Duty to Warn:no Physical Aggression /  Violence:No  Access to Firearms a concern: No  Gang Involvement:No   Subjective: Met with patient via phone.  She had been worked in for an emergency session.  Patient reported she was not doing well.  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  Patient shared she was able to start exercising and got it approved with her boss that she and her friend will walk at lunchtime so that was a positive thing that she had followed up on from last session.  Had patient think through when she first made contact with the cat and the cognitions were going on in her head.  Patient was able to identify that she feels unwanted and unneeded.  Had her think back to when she felt that for the first time.  She stated the first time she could remember having those feelings was age [age].  Encouraged her to tell that part of her that she is wanted and she is enough.  Agreed that that would be addressed in processing at next session through E MDR.  Taught patient CBT thought stopping technique STOPP and discussed how to utilize that when those negative cognitions surface.  Also discussed other ways to release emotions appropriately in the importance of her focusing on keeping her brain engaged in positive activity.  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  Patient was also encouraged to talk to  her husband about the 2 with them affirming each other on a daily basis so that things can stay in a more positive direction.  She had felt husband on the phone to discuss that option and he was encouraged to try and remind her regularly that she is needed and wanted.  Unfortunately during the conversation he shared some of his thoughts on what has occurred.  He was encouraged to try and stay positive with patient and to take things 1 step at a time.  Interventions: Cognitive Behavioral  Therapy and Solution-Oriented/Positive Psychology  Diagnosis:   ICD-10-CM   1. Bipolar II disorder ([place])  F31.81     Plan: Patient is to practice CBT skills includingSTOPP technique.  She is also to work on exercising and her affirmations regularly.  Patient and husband are to write out and affirmation for each other daily.  And patient is to remind herself she is wanted.  These things are to be utilized to help decrease the negative mood and impulsive behaviors  Long term goal: Develop the ability to recognize, accept, and cope with the feelings of depression. Short term goal: Utilize behavioral strategies to overcome depression, identify and replace depressive thinking that leads to depressive feelings and actions.  [name], [facility]

## 2019-04-09 ENCOUNTER — Encounter: Payer: Self-pay | Admitting: Psychiatry

## 2019-04-13 ENCOUNTER — Other Ambulatory Visit: Payer: Self-pay | Admitting: Physician Assistant

## 2019-04-19 ENCOUNTER — Ambulatory Visit (INDEPENDENT_AMBULATORY_CARE_PROVIDER_SITE_OTHER): Payer: BC Managed Care – PPO | Admitting: Psychiatry

## 2019-04-19 ENCOUNTER — Encounter: Payer: Self-pay | Admitting: Psychiatry

## 2019-04-19 ENCOUNTER — Other Ambulatory Visit: Payer: Self-pay

## 2019-04-19 DIAGNOSIS — F3181 Bipolar II disorder: Secondary | ICD-10-CM

## 2019-04-19 NOTE — Progress Notes (Signed)
[facility] Counselor/Therapist Progress Note  Patient ID: [name], MRN: [id number],    Date: 04/19/2019  Time Spent: 50 minutes start time 5:50 PM time 6:40 PM  Treatment Type: Individual Therapy  Reported Symptoms: anxiety, depression, focusing issues, crying spells  Mental Status Exam:  Appearance:   Well Groomed     Behavior:  Appropriate  Motor:  Normal  Speech/Language:   Normal Rate  Affect:  Full Range  Mood:  anxious and sad  Thought process:  racing some  Thought content:    Rumination  Sensory/Perceptual disturbances:    WNL  Orientation:  oriented to person, place, time/date and situation  Attention:  Good  Concentration:  Good  Memory:  WNL  Fund of knowledge:   Fair  Insight:    Fair  Judgment:   Good  Impulse Control:  Good   Risk Assessment: Danger to Self:  No Self-injurious Behavior: No Danger to Others: No Duty to Warn:no Physical Aggression / Violence:No  Access to Firearms a concern: No  Gang Involvement:No   Subjective: Patient was present for session.  Patient reported she has been following through on plans from last session and they are keeping her from completely falling apart but she is still having lots of issues with her husband.  Patient shared she is walking during the day now at lunch and that has helped but stress and conflict within her marriage is overwhelming to her.  Did E MDR set on what she identifies is the time that things started to go bad.  Patient used a picture of seeing a blank form, suds level 9, negative cognition "I am not wanted" felt sadness in her stomach.  Patient was able to reduce suds level to 4.  Through the processing she was able to realize her son going to graduate seem to bring out the fact that she was not connected with her husband and had not been for a long time.  Patient was able to realize that when they moved into the home beside of her in-laws is when things started going in a negative direction.   Patient reported that she had not wanted to move and then they moved very quickly and chaotically.  She reported she has never felt at home in the house that they are living because it was given to her and her husband by her in-laws and they live beside of her.  Patient stated even though they very nice she feels very smothered and that nothing is her own.  Patient was asked if she shared her feelings with her husband and she shared she has not.  Encouraged her to communicate with her husband about her feelings and to talk to him about having one room in the house that is off limits to them and she can make that her private space so she has somewhere for her.  Interventions: Solution-Oriented/Positive Psychology and Eye Movement Desensitization and Reprocessing (EMDR)  Diagnosis:   ICD-10-CM   1. Bipolar II disorder (HCC)  F31.81     Plan: Patient is to continue using CBT and coping skills discussed in previous sessions as well as going for walks at lunch on a daily basis to help decrease her depressed mood.  Patient is also to communicate her realizations from session with her husband to see if some resolution can occur so that she can feel better.  Long-term goal: Develop the ability to recognize, accept, and cope with feelings of depression Short-term  goal: Utilize behavioral strategies to overcome depression Identify and replace depressive thinking that leads to depressive feelings and actions [name], [facility]

## 2019-04-25 ENCOUNTER — Other Ambulatory Visit: Payer: Self-pay | Admitting: Physician Assistant

## 2019-04-25 DIAGNOSIS — Z79899 Other long term (current) drug therapy: Secondary | ICD-10-CM

## 2019-04-25 LAB — BASIC METABOLIC PANEL
BUN/Creatinine Ratio: 15 (ref 9–23)
BUN: 10 mg/dL (ref 6–24)
CO2: 24 mmol/L (ref 20–29)
Calcium: 9.2 mg/dL (ref 8.7–10.2)
Chloride: 102 mmol/L (ref 96–106)
Creatinine, Ser: 0.68 mg/dL (ref 0.57–1.00)
GFR calc Af Amer: 122 mL/min/{1.73_m2} (ref >59)
GFR calc non Af Amer: 106 mL/min/{1.73_m2} (ref >59)
Glucose: 106 mg/dL — ABNORMAL HIGH (ref 65–99)
Potassium: 4.4 mmol/L (ref 3.5–5.2)
Sodium: 138 mmol/L (ref 134–144)

## 2019-04-25 LAB — TSH: TSH: 3.54 u[IU]/mL (ref 0.450–4.500)

## 2019-04-25 LAB — LITHIUM LEVEL: Lithium Lvl: 0.5 mmol/L — ABNORMAL LOW (ref 0.6–1.2)

## 2019-04-25 NOTE — Progress Notes (Signed)
Left voicemail to call back for results.

## 2019-04-25 NOTE — Progress Notes (Signed)
The Li level is low.  Increase to 300 mg, 3 pills qhs and repeat lab in 7-10 days.  The kidney functions and thyroid are perfect. I'll order lab and we need to mail order to her

## 2019-04-26 ENCOUNTER — Other Ambulatory Visit: Payer: Self-pay

## 2019-04-26 MED ORDER — LITHIUM CARBONATE 300 MG PO TABS: ORAL_TABLET | ORAL | 2 refills | Status: DC

## 2019-04-26 NOTE — Telephone Encounter (Signed)
Patient given lab results and advised to increase lithium 300 mg to 3 at hs. Pt verified instructions and asked that an updated Rx be sent to [facility] on [address] In [place]. Pt was relieved the level was low because she felt like it wasn't working.

## 2019-04-26 NOTE — Progress Notes (Signed)
Patient notified of lab results and instructions. See phone message

## 2019-05-07 ENCOUNTER — Ambulatory Visit (INDEPENDENT_AMBULATORY_CARE_PROVIDER_SITE_OTHER): Payer: BC Managed Care – PPO | Admitting: Psychiatry

## 2019-05-07 ENCOUNTER — Other Ambulatory Visit: Payer: Self-pay

## 2019-05-07 DIAGNOSIS — F3181 Bipolar II disorder: Secondary | ICD-10-CM | POA: Diagnosis not present

## 2019-05-07 NOTE — Progress Notes (Signed)
[facility] Counselor/Therapist Progress Note  Patient ID: [name], MRN: [id number],    Date: 05/07/2019  Time Spent: 27 minutes start time 5:02PM end time 5:29 PM Virtual Visit via Telephone Note Connected with patient by a video enabled telemedicine/telehealth application or telephone, with their informed consent, and verified patient privacy and that I am speaking with the correct person using two identifiers. I discussed the limitations, risks, security and privacy concerns of performing psychotherapy and management service by telephone and the availability of in person appointments. I also discussed with the patient that there may be a patient responsible charge related to this service. The patient expressed understanding and agreed to proceed. I discussed the treatment planning with the patient. The patient was provided an opportunity to ask questions and all were answered. The patient agreed with the plan and demonstrated an understanding of the instructions. The patient was advised to call  our office if  symptoms worsen or feel they are in a crisis state and need immediate contact.   Therapist Location: office Patient Location: home    Treatment Type: Individual Therapy  Reported Symptoms: depression-decreased, anxiety- decreased  Mental Status Exam:  Appearance:   NA     Behavior:  Sharing  Motor:  NA  Speech/Language:   Normal Rate  Affect:  NA  Mood:  anxious  Thought process:  normal  Thought content:    WNL  Sensory/Perceptual disturbances:    WNL  Orientation:  oriented to person, place, time/date and situation  Attention:  Fair  Concentration:  Fair  Memory:  WNL  Fund of knowledge:   Fair  Insight:    Fair  Judgment:   Good  Impulse Control:  Good   Risk Assessment: Danger to Self:  No Self-injurious Behavior: No Danger to Others: No Duty to Warn:no Physical Aggression / Violence:No  Access to Firearms a concern: No  Gang Involvement:No    Subjective: Met with patient via phone.  She was in her husband's vehicle coming back from having a COVID-19 test.  She explained that she had direct contact with somebody who was infected with a virus and she was starting to show symptoms.  Patient reported until this incident she was doing much better.  She shared that her depression and anxiety had decreased greatly after last session.  She shared she has really been working on the automatic negative thoughts and has noticed a decreased just after the EMDR set.  Patient also shared she is continuing to exercise and that she and her husband are doing much better.  Patient reported that currently her biggest issue was feeling anxiety for no apparent reason.  As she was encouraged to think through when she was feeling anxious she was able to recognize that her anxiety is probably coming because she was unable to get what she needed from her workplace to be able to work from home for the next 10 days as she is expected to.  Patient was encouraged to think through what she has to accomplish over the next 10 days with school.  She was able to break down each day what she would need to accomplish and realize that it is realistic for her to accomplish her goals.  The importance of working on her CBT filters and talking herself through maintaining a realistic perspective was discussed with patient.  Patient was able to acknowledge the most important thing for the next 10 days is for her to get healthy so  she has to focus more on that than anything else.  Patient reported feeling positive about plans from session and that she will be able to take care of herself and get back to school in 10 days.  She agreed to call back later to reschedule another appointment with clinician.  Interventions: Cognitive Behavioral Therapy and Solution-Oriented/Positive Psychology  Diagnosis:   ICD-10-CM   1. Bipolar II disorder ([place])  F31.81     Plan: Patient is to use her CBT  and coping skills to continue working on maintaining an appropriate perspective that helps to decrease her depression and anxiety.  Patient will rest over the next 10 days or as long as it takes to get over her sickness and then get back to exercising and focusing on taking care of her body in a positive manner.  Long-term goal: Develop the ability to recognize accept and cope with feelings of depression Short-term goal: Utilize behavioral strategies to overcome depression Identify and replace depressive thinking that leads to depressive feelings and actions  [name], [facility]

## 2019-05-09 ENCOUNTER — Ambulatory Visit: Payer: BC Managed Care – PPO | Admitting: Physician Assistant

## 2019-05-10 ENCOUNTER — Ambulatory Visit: Payer: BC Managed Care – PPO | Admitting: Psychiatry

## 2019-05-13 ENCOUNTER — Other Ambulatory Visit: Payer: Self-pay | Admitting: Physician Assistant

## 2019-05-15 ENCOUNTER — Other Ambulatory Visit: Payer: Self-pay | Admitting: Physician Assistant

## 2019-05-21 ENCOUNTER — Ambulatory Visit (INDEPENDENT_AMBULATORY_CARE_PROVIDER_SITE_OTHER): Payer: BC Managed Care – PPO | Admitting: Psychiatry

## 2019-05-21 ENCOUNTER — Other Ambulatory Visit: Payer: Self-pay

## 2019-05-21 DIAGNOSIS — F3181 Bipolar II disorder: Secondary | ICD-10-CM

## 2019-05-21 NOTE — Progress Notes (Signed)
      [facility] Counselor/Therapist Progress Note  Patient ID: [name], MRN: [id number],    Date: 05/21/2019  Time Spent: 40 minutes start time 5:03 PM end time 5:43 PM  Treatment Type: Individual Therapy  Reported Symptoms: anxiety, sadness, panic,   Mental Status Exam:  Appearance:   Casual     Behavior:  Appropriate  Motor:  Normal  Speech/Language:   Normal Rate  Affect:  Appropriate  Mood:  sad  Thought process:  normal  Thought content:    WNL  Sensory/Perceptual disturbances:    WNL  Orientation:  oriented to person, place, time/date and situation  Attention:  Good  Concentration:  Good  Memory:  WNL  Fund of knowledge:   Fair  Insight:    Fair  Judgment:   Fair  Impulse Control:  Fair   Risk Assessment: Danger to Self:  No Self-injurious Behavior: No Danger to Others: No Duty to Warn:no Physical Aggression / Violence:No  Access to Firearms a concern: No  Gang Involvement:No   Subjective: Patient was present for session.  She shared her job situation is very difficult.  She went on to share that yesterday she had major anxiety but she is not sure what caused that. That was the only time she could remember when it was really bad.  Did EMDR set on that issue-the picture was sitting on the front row, suds level 10, negative cognition "I am exposed" felt fear in her chest.  Patient was able to reduce suds level to 2.  She was able to develop a visual of a blue peaceful color that she can remember anytime the negative emotion surface.  Patient was also able to verbalize that she is forgiven and that she does not have to carry her mistakes any longer.  Patient was encouraged to work on her CBT skills and to remind herself of the truth regularly.  She is to research Dr. Tawanna Solo to read information about detoxing the brain.  Patient is also to continue exercising regularly.  She explained that they are preparing her son to go off to college soon.  Discussed the  importance of her reminding herself that she is needed and continuing to breathe and enjoy the time with her son rather than feeling the sadness over his leaving.  Interventions: Cognitive Behavioral Therapy, Solution-Oriented/Positive Psychology and Eye Movement Desensitization and Reprocessing (EMDR)  Diagnosis:   ICD-10-CM   1. Bipolar II disorder ([place])  F31.81     Plan: She is to utilize CBT and coping skills to help maintain an appropriate perspective to decrease her anxiety and depression.  Patient is also to work on writing out and affirmation on her mirror daily to remind herself of the truth.  Patient is to research Dr. Tawanna Solo and read information about detoxing the brain.  Patient is also to continue exercising daily to release her emotions appropriately. Long-term goal: Develop the ability to recognize accept and cope with feelings of depression Short-term goal: Utilize behavioral strategies to overcome depression Identify and replace depressive thinking that leads to depressive feelings and actions  [name], [facility]

## 2019-05-26 LAB — LITHIUM LEVEL: Lithium Lvl: 0.6 mmol/L (ref 0.6–1.2)

## 2019-05-29 ENCOUNTER — Encounter: Payer: Self-pay | Admitting: Physician Assistant

## 2019-05-29 ENCOUNTER — Other Ambulatory Visit: Payer: Self-pay

## 2019-05-29 ENCOUNTER — Ambulatory Visit (INDEPENDENT_AMBULATORY_CARE_PROVIDER_SITE_OTHER): Payer: BC Managed Care – PPO | Admitting: Physician Assistant

## 2019-05-29 DIAGNOSIS — F900 Attention-deficit hyperactivity disorder, predominantly inattentive type: Secondary | ICD-10-CM

## 2019-05-29 DIAGNOSIS — F3160 Bipolar disorder, current episode mixed, unspecified: Secondary | ICD-10-CM

## 2019-05-29 DIAGNOSIS — Z79899 Other long term (current) drug therapy: Secondary | ICD-10-CM

## 2019-05-29 DIAGNOSIS — F411 Generalized anxiety disorder: Secondary | ICD-10-CM

## 2019-05-29 MED ORDER — LITHIUM CARBONATE 300 MG PO TABS: 1200.0000 mg | ORAL_TABLET | Freq: Every day | ORAL | 1 refills | Status: DC

## 2019-05-29 NOTE — Progress Notes (Signed)
[facility] Med Check  Patient ID: [name],  MRN: [id number]  PCP: [name]  Date of Evaluation: 05/29/2019 Time spent:15 minutes  Chief Complaint:  Chief Complaint    Anxiety; Depression; ADD; Follow-up     Virtual Visit via Telephone Note  I connected with patient by a video enabled telemedicine application or telephone, with their informed consent, and verified patient privacy and that I am speaking with the correct person using two identifiers.  I am private, in my office and the patient is at work.  I discussed the limitations, risks, security and privacy concerns of performing an evaluation and management service by telephone and the availability of in person appointments. I also discussed with the patient that there may be a patient responsible charge related to this service. The patient expressed understanding and agreed to proceed.   I discussed the assessment and treatment plan with the patient. The patient was provided an opportunity to ask questions and all were answered. The patient agreed with the plan and demonstrated an understanding of the instructions.   The patient was advised to call back or seek an in-person evaluation if the symptoms worsen or if the condition fails to improve as anticipated.  I provided 15 minutes of non-face-to-face time during this encounter.  HISTORY/CURRENT STATUS: HPI for routine med check.  Patient states she is doing a lot better, "but I think I could be doing even better."  States she has not had a low mood in a couple of months now.  She is able to enjoy things.  Energy and motivation are better.  She is not crying easily.  Denies suicidal or homicidal thoughts.  She just feels that her mood could be more "level."  Patient denies increased energy with decreased need for sleep, no increased talkativeness, no racing thoughts, no impulsivity or risky behaviors, no increased spending, no increased libido, no  grandiosity.  No hallucinations.  Occasional anxiety and she is not needing the Xanax very often but it does help when needed.  She is seeing Stevphen Meuse, [facility] which has really helped with anxiety.  She sleeps well.  Work is going fine.  Denies dizziness, syncope, seizures, numbness, tingling, tremor, tics, unsteady gait, slurred speech, confusion. Denies muscle or joint pain, stiffness, or dystonia.  Individual Medical History/ Review of Systems: Changes? :No    Past medications for mental health diagnoses include: Zoloft, trazodone, Xanax, Prozac, Vraylar, Cymbalta, Rexulti  Allergies: Oxycodone, Sulfa antibiotics, and Tamiflu [oseltamivir phosphate]  Current Medications:  Current Outpatient Medications:  .  ALPRAZolam (XANAX) 0.5 MG tablet, Take 1 tablet (0.5 mg total) by mouth 2 (two) times daily as needed for anxiety., Disp: 30 tablet, Rfl: 0 .  [START ON 06/20/2019] amphetamine-dextroamphetamine (ADDERALL) 15 MG tablet, Take 1 tablet by mouth daily., Disp: 30 tablet, Rfl: 0 .  amphetamine-dextroamphetamine (ADDERALL) 15 MG tablet, Take 1 tablet by mouth daily., Disp: 30 tablet, Rfl: 0 .  amphetamine-dextroamphetamine (ADDERALL) 15 MG tablet, Take 1 tablet by mouth daily., Disp: 30 tablet, Rfl: 0 .  DULoxetine (CYMBALTA) 60 MG capsule, TAKE 2 CAPSULES(120 MG) BY MOUTH DAILY, Disp: 60 capsule, Rfl: 1 .  esomeprazole (NEXIUM) 40 MG capsule, Take 40-80 mg by mouth daily as needed (for acid reflux). , Disp: , Rfl:  .  lisinopril (PRINIVIL,ZESTRIL) 20 MG tablet, Take 20 mg by mouth every evening. , Disp: , Rfl:  .  lithium 300 MG tablet, Take 4 tablets (1,200 mg total) by mouth at bedtime.,  Disp: 120 tablet, Rfl: 1 .  VRAYLAR 6 MG CAPS, TAKE 1 CAPSULE(6 MG) BY MOUTH DAILY, Disp: 30 capsule, Rfl: 1 .  hydrOXYzine (ATARAX/VISTARIL) 10 MG tablet, Take 0.5-2 tablets (5-20 mg total) by mouth every 6 (six) hours as needed. (Patient not taking: Reported on 03/30/2019), Disp: 60 tablet, Rfl:  1 Medication Side Effects: none  Family Medical/ Social History: Changes? No  MENTAL HEALTH EXAM:  There were no vitals taken for this visit.There is no height or weight on file to calculate BMI.  General Appearance: unable to assess  Eye Contact:  unable to assess  Speech:  Clear and Coherent  Volume:  Normal  Mood:  Euthymic  Affect:  unable to assess  Thought Process:  Goal Directed and Descriptions of Associations: Intact  Orientation:  Full (Time, Place, and Person)  Thought Content: Logical   Suicidal Thoughts:  No  Homicidal Thoughts:  No  Memory:  WNL  Judgement:  Good  Insight:  Good  Psychomotor Activity:  unable to assess  Concentration:  Concentration: Good and Attention Span: Good  Recall:  Good  Fund of Knowledge: Good  Language: Good  Assets:  Desire for Improvement  ADL's:  Intact  Cognition: WNL  Prognosis:  Good  Lithium level on 05/25/2019 was 0.6  DIAGNOSES:    ICD-10-CM   1. Attention deficit hyperactivity disorder (ADHD), predominantly inattentive type  F90.0   2. Bipolar 1 disorder, mixed ([place])  F31.60   3. Generalized anxiety disorder  F41.1   4. Encounter for long-term (current) use of medications  Z79.899 Lithium level    Lipid panel    Comprehensive metabolic panel    Hemoglobin A1c    Receiving Psychotherapy: Yes  [name], [facility]   RECOMMENDATIONS:  Increase lithium 300 mg to 4 p.o. nightly. Continue Xanax 0.5 mg 1 twice daily as needed, take rarely. Continue Adderall 15 mg 1 p.o. every morning. Continue Cymbalta 60 mg, 2 p.o. daily. Continue Vraylar 6 mg (she had already decreased the extra 1.5 and is doing fine only on the 6) Draw CMP, hemoglobin A1c, lipid panel, lithium level in approximately 10 days. Continue psychotherapy with [name], [facility]. Return in 4 to 6 weeks.  Donnal Moat, PA-C

## 2019-06-04 ENCOUNTER — Ambulatory Visit: Payer: BC Managed Care – PPO | Admitting: Psychiatry

## 2019-06-04 ENCOUNTER — Other Ambulatory Visit: Payer: Self-pay

## 2019-06-04 DIAGNOSIS — F3181 Bipolar II disorder: Secondary | ICD-10-CM

## 2019-06-04 NOTE — Progress Notes (Signed)
      [facility] Counselor/Therapist Progress Note  Patient ID: [name], MRN: [id number],    Date: 06/04/2019  Time Spent: 46 minutes start time 5:04 PM end time 5:50 PM  Treatment Type: Individual Therapy  Reported Symptoms: anxiety, sadness, threw up yesterday  Mental Status Exam:  Appearance:   Casual     Behavior:  Appropriate  Motor:  Normal  Speech/Language:   Normal Rate  Affect:  Appropriate  Mood:  anxious  Thought process:  normal  Thought content:    WNL  Sensory/Perceptual disturbances:    WNL  Orientation:  oriented to person, place, time/date and situation  Attention:  Good  Concentration:  Good  Memory:  WNL  Fund of knowledge:   Fair  Insight:    Fair  Judgment:   Good  Impulse Control:  Good   Risk Assessment: Danger to Self:  No Self-injurious Behavior: No Danger to Others: No Duty to Warn:no Physical Aggression / Violence:No  Access to Firearms a concern: No  Gang Involvement:No   Subjective: Patient was present for session.  She stated her anxiety is still up and she has noticed it has to do with transitional things like going to work or church.  Once she is home it is fine. She stated everything that causes her to leave the house stirs up anxiety.  Encouraged her to work through the issues since the anxiety will eventually lead to her depression again.  Did EMDR set on throwing up due to anxiety, suds level 10, negative cognition "it is my fault" felt fear in her stomach.  Patient was able to resolve the set.  She was able to realize that she is putting too much pressure on herself to have everything perfect with work and that is not realistic.  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  Patient was encouraged to use her CBT skills to keep things in perspective and reminding herself she is enough.  Patient is also encouraged to continue  walking at lunchtime to release her motion in an appropriate manner.  Interventions: Cognitive Behavioral Therapy, Solution-Oriented/Positive Psychology and Eye Movement Desensitization and Reprocessing (EMDR)  Diagnosis:   ICD-10-CM   1. Bipolar II disorder ([place])  F31.81     Plan: Patient is to utilize CBT and coping skills to help maintain perspective so that she can maintain an appropriate mood.  Patient is to work on taking walks daily at lunchtime to release emotions appropriately. Long-term goal: Develop the ability to recognize accept and cope with feelings of depression Short-term goal: Utilize behavioral strategies to overcome depression Identify and replace depressive thinking that leads to depressive feelings and actions  [name], [facility]

## 2019-06-14 LAB — COMPREHENSIVE METABOLIC PANEL
ALT: 25 IU/L (ref 0–32)
AST: 19 IU/L (ref 0–40)
Albumin/Globulin Ratio: 2 (ref 1.2–2.2)
Albumin: 4.8 g/dL (ref 3.8–4.8)
Alkaline Phosphatase: 72 IU/L (ref 39–117)
BUN/Creatinine Ratio: 13 (ref 9–23)
BUN: 9 mg/dL (ref 6–24)
Bilirubin Total: 0.4 mg/dL (ref 0.0–1.2)
CO2: 20 mmol/L (ref 20–29)
Calcium: 10.3 mg/dL — ABNORMAL HIGH (ref 8.7–10.2)
Chloride: 99 mmol/L (ref 96–106)
Creatinine, Ser: 0.72 mg/dL (ref 0.57–1.00)
GFR calc Af Amer: 117 mL/min/{1.73_m2} (ref >59)
GFR calc non Af Amer: 101 mL/min/{1.73_m2} (ref >59)
Globulin, Total: 2.4 g/dL (ref 1.5–4.5)
Glucose: 101 mg/dL — ABNORMAL HIGH (ref 65–99)
Potassium: 4.3 mmol/L (ref 3.5–5.2)
Sodium: 139 mmol/L (ref 134–144)
Total Protein: 7.2 g/dL (ref 6.0–8.5)

## 2019-06-14 LAB — HEMOGLOBIN A1C
Est. average glucose Bld gHb Est-mCnc: 111 mg/dL
Hgb A1c MFr Bld: 5.5 % (ref 4.8–5.6)

## 2019-06-14 LAB — LIPID PANEL
Chol/HDL Ratio: 2.7 ratio (ref 0.0–4.4)
Cholesterol, Total: 191 mg/dL (ref 100–199)
HDL: 71 mg/dL (ref >39)
LDL Chol Calc (NIH): 97 mg/dL (ref 0–99)
Triglycerides: 130 mg/dL (ref 0–149)
VLDL Cholesterol Cal: 23 mg/dL (ref 5–40)

## 2019-06-14 LAB — LITHIUM LEVEL: Lithium Lvl: 0.9 mmol/L (ref 0.6–1.2)

## 2019-06-18 ENCOUNTER — Other Ambulatory Visit: Payer: Self-pay

## 2019-06-18 ENCOUNTER — Ambulatory Visit: Payer: BC Managed Care – PPO | Admitting: Psychiatry

## 2019-06-18 DIAGNOSIS — F3181 Bipolar II disorder: Secondary | ICD-10-CM

## 2019-06-18 NOTE — Progress Notes (Signed)
Left pt a detailed message and asked her to call back with any questions.

## 2019-06-18 NOTE — Progress Notes (Signed)
[facility] Counselor/Therapist Progress Note  Patient ID: [name], MRN: [id number],    Date: 06/18/2019  Time Spent: 45 minutes start time 5:05 PM end time 5:50 PM  Treatment Type: Individual Therapy  Reported Symptoms: anxiety, panic, triggered responses, too much sleep, focusing issues, low motivation, hygiene issues  Mental Status Exam:  Appearance:    Fair hygiene   Behavior:  Agitated  Motor:  Normal  Speech/Language:   Normal Rate  Affect:  Congruent  Mood:  anxious  Thought process:  circumstantial  Thought content:    Rumination  Sensory/Perceptual disturbances:    WNL  Orientation:  oriented to person, place, time/date and situation  Attention:  Fair  Concentration:  Fair  Memory:  WNL  Fund of knowledge:   Fair  Insight:    Fair  Judgment:   Fair  Impulse Control:  Good   Risk Assessment: Danger to Self:  No Self-injurious Behavior: No Danger to Others: No Duty to Warn:no Physical Aggression / Violence:No  Access to Firearms a concern: No  Gang Involvement:No   Subjective: patient was present for session.  She reported she was doing better after last session, but the anxiety  Increased again after the Holiday.  She shared Saturday had been a good day and her anxiety seemed okay but by the evening and on Sunday it was bad again.  Patient had brought her husband with her to session.  He explained he could not even get her off the couch much on Sunday.  Patient shared she is very overwhelmed with work and she is not sure she is doing everything she is supposed to do.  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  Patient explained that she cannot get all of her children to meetings and there is one parent she is not been able to get in touch with her and she is worried that she is not doing enough.  Patient was encouraged to talk to her principal about what the  expectations are of her during the situation and explained her concerns and frustrations.  She was also encouraged to write out the facts including she can only do the best she can do, she cannot control the behaviors of others,... Patient was encouraged to start each day with her affirmations and reminding herself of the truth.  She was taught the grounding exercises counting backwards from 100.  Patient was also encouraged to stay focused on exercise and her spiritual affirmations during this very difficult time.  Wrote out the different techniques and plans on index cards for patient to take home and refer to as needed.  Both patient and husband reported feeling positive about plans from session and agreed to follow through.  Message was sent to Donnal Moat PA-C concerning patient's questions about her anxiety medication.  Interventions: Cognitive Behavioral Therapy and Solution-Oriented/Positive Psychology  Diagnosis:   ICD-10-CM   1. Bipolar II disorder ([place])  F31.81     Plan: Patient is to utilize CBT and coping skills to help manage mood appropriately.  Patient is to work on [employer] traffic [employer], writing out the facts, and talking to her principal about expectations.  Patient's husband agreed to help her as needed by reminding her of different exercises taught to her in session. Long-term goal: Develop the ability to recognize accept and cope with feelings of depression Short-term goal: Utilize behavioral strategies to overcome depression Identify and  replace depressive thinking that leads to depressive feelings and actions Stevphen Meuse, [facility]

## 2019-06-18 NOTE — Progress Notes (Signed)
Please let pt know Nicoletta Dress level is good, stay on same dose.  Chol panel, Glu, kidney and liver functions are all good.  No changes in meds.

## 2019-07-02 ENCOUNTER — Telehealth: Payer: Self-pay | Admitting: Physician Assistant

## 2019-07-02 ENCOUNTER — Other Ambulatory Visit: Payer: Self-pay | Admitting: Physician Assistant

## 2019-07-02 DIAGNOSIS — Z79899 Other long term (current) drug therapy: Secondary | ICD-10-CM

## 2019-07-02 MED ORDER — LITHIUM CARBONATE 300 MG PO TABS: 1500.0000 mg | ORAL_TABLET | Freq: Every day | ORAL | 1 refills | Status: DC

## 2019-07-02 NOTE — Telephone Encounter (Signed)
Lets increase the Lithium to 1500 mg (5 pills) qhs.  That will help depression and her last level was 0.9 about 2-3 weeks ago.  If needs more, I can send it in.  Will need a Li level [place] or Tues next week.  I'll print order, please send to her.  Thanks.

## 2019-07-02 NOTE — Telephone Encounter (Signed)
Patient notified and verbalized understanding. Lab order will be mailed to patient.

## 2019-07-02 NOTE — Telephone Encounter (Signed)
Patient called and said that her ani depresent medcation is not working. She feels terrible and has for two weeks. She said the lithium is working. Her pharmacy [facility] in [place] [address]. Her phone number is [phone]

## 2019-07-02 NOTE — Telephone Encounter (Signed)
I went ahead and sent in the Lithium 5 pills/d.  I know she'll run out soon.

## 2019-07-08 ENCOUNTER — Other Ambulatory Visit: Payer: Self-pay | Admitting: Physician Assistant

## 2019-07-11 ENCOUNTER — Other Ambulatory Visit: Payer: Self-pay | Admitting: Physician Assistant

## 2019-07-11 DIAGNOSIS — Z79899 Other long term (current) drug therapy: Secondary | ICD-10-CM

## 2019-07-11 LAB — LITHIUM LEVEL: Lithium Lvl: 0.8 mmol/L (ref 0.6–1.2)

## 2019-07-11 NOTE — Progress Notes (Signed)
Pt. Made aware and lab orders have been printed and ready to be mailed.

## 2019-07-11 NOTE — Progress Notes (Signed)
Increase to 6 pills (1,800 mg) at bedtime.  Repeat Li level in 7-10 days. I'll order labs.  Please mail to her.

## 2019-07-11 NOTE — Progress Notes (Signed)
Please call her and let her know that lithium level is 0.8 which is good.  If she is feeling well and stable, continue the same dose of lithium.  If she has any concerns of worsening depression or mania, we will increase the lithium slightly.  Let me know.

## 2019-07-21 ENCOUNTER — Other Ambulatory Visit: Payer: Self-pay | Admitting: Physician Assistant

## 2019-08-07 LAB — LITHIUM LEVEL: Lithium Lvl: 0.9 mmol/L (ref 0.6–1.2)

## 2019-08-07 NOTE — Progress Notes (Signed)
Pt. Made aware.

## 2019-08-07 NOTE — Progress Notes (Signed)
Please let her know Dierdre Searles level is good.  Cont same dose.

## 2019-08-07 NOTE — Progress Notes (Signed)
Left her a VM to return my call.

## 2019-08-08 ENCOUNTER — Ambulatory Visit (INDEPENDENT_AMBULATORY_CARE_PROVIDER_SITE_OTHER): Payer: BC Managed Care – PPO | Admitting: Psychiatry

## 2019-08-08 DIAGNOSIS — F3181 Bipolar II disorder: Secondary | ICD-10-CM

## 2019-08-08 NOTE — Progress Notes (Signed)
[facility] Counselor/Therapist Progress Note  Patient ID: [name], MRN: [id number],    Date: 08/12/2019  Time Spent: 46 minutes start time 5:09 PM end time 5:55 Virtual Visit via Telephone Note Connected with patient by a video enabled telemedicine/telehealth application, with their informed consent, and verified patient privacy and that I am speaking with the correct person using two identifiers. I discussed the limitations, risks, security and privacy concerns of performing psychotherapy and management service by telephone and the availability of in person appointments. I also discussed with the patient that there may be a patient responsible charge related to this service. The patient expressed understanding and agreed to proceed. I discussed the treatment planning with the patient. The patient was provided an opportunity to ask questions and all were answered. The patient agreed with the plan and demonstrated an understanding of the instructions. The patient was advised to call  our office if  symptoms worsen or feel they are in a crisis state and need immediate contact.   Therapist Location: office Patient Location: home    Treatment Type: Individual Therapy  Reported Symptoms: Anxiety, low motivation  Mental Status Exam:  Appearance:   Casual     Behavior:  Sharing  Motor:  Normal  Speech/Language:   Normal Rate  Affect:  Appropriate  Mood:  sad  Thought process:  normal  Thought content:    WNL  Sensory/Perceptual disturbances:    WNL  Orientation:  oriented to person, place, time/date and situation  Attention:  Good  Concentration:  Good  Memory:  WNL  Fund of knowledge:   Fair  Insight:    Fair  Judgment:   Good  Impulse Control:  Good   Risk Assessment: Danger to Self:  No Self-injurious Behavior: No Danger to Others: No Duty to Warn:no Physical Aggression / Violence:No  Access to Firearms a concern: No  Gang Involvement:No   Subjective: With  patient via virtual session through WebEx.  Patient reported that overall she is doing well.  She shared that she was able to send her son off to a issue without any major problems.  Patient was encouraged to recognize that shows that the previous work from sessions was helpful.  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  Patient was encouraged to try and figure out the parts of the situation that were creating the most stress for her and seeing if solutions could be developed.  Patient was encouraged to talk to the teachers in advance that she would be going into the classrooms to see what they can do to set up the rooms so that there is the least amount of difficulty.  Patient was also encouraged to get the list of the students working from home as quickly as possible to start developing plans for how to deal with those situations.  Patient was also reminded that she has to take things 1 step at a time and deal  with the issues that she knows about before she starts worrying too much in advance.  Patient agreed to work on plans from session to try and manage the anxiety she is feeling appropriately.  Interventions: Cognitive Behavioral Therapy and Solution-Oriented/Positive Psychology  Diagnosis:   ICD-10-CM   1. Bipolar II disorder (HCC)  F31.81     Plan: She is to follow CBT and coping skills to decrease depression /anxiety symptoms.  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  Patient is to continue communicating with her son and letting him know how proud of him she is.  Patient is to continue walking each day to release anxiety and depression Long-term goal: Develop the ability to recognize accept and cope with feelings of depression Short term goal:.  Utilize behavioral strategies to overcome depression Identify and replace depressive thinking that leads to depressive feelings and actions   Stevphen Meuse, [facility]

## 2019-08-15 ENCOUNTER — Ambulatory Visit: Payer: BC Managed Care – PPO | Admitting: Physician Assistant

## 2019-08-22 ENCOUNTER — Other Ambulatory Visit: Payer: Self-pay

## 2019-08-22 ENCOUNTER — Telehealth: Payer: Self-pay | Admitting: Physician Assistant

## 2019-08-22 MED ORDER — AMPHETAMINE-DEXTROAMPHETAMINE 15 MG PO TABS: 15.0000 mg | ORAL_TABLET | Freq: Every day | ORAL | 0 refills | Status: DC

## 2019-08-22 NOTE — Telephone Encounter (Signed)
Last refill 06/24/2019, has apt 09/05/2019  Pended for Rosey Bath to approve

## 2019-08-22 NOTE — Telephone Encounter (Signed)
Patient called and said that she is out of her adderall 15 mg she has an appt on 2/17. Please send it to the [facility] [address] in [place]

## 2019-08-23 ENCOUNTER — Ambulatory Visit: Payer: BC Managed Care – PPO | Admitting: Psychiatry

## 2019-09-05 ENCOUNTER — Ambulatory Visit (INDEPENDENT_AMBULATORY_CARE_PROVIDER_SITE_OTHER): Payer: BC Managed Care – PPO | Admitting: Physician Assistant

## 2019-09-05 ENCOUNTER — Encounter: Payer: Self-pay | Admitting: Physician Assistant

## 2019-09-05 DIAGNOSIS — F3130 Bipolar disorder, current episode depressed, mild or moderate severity, unspecified: Secondary | ICD-10-CM | POA: Diagnosis not present

## 2019-09-05 DIAGNOSIS — F900 Attention-deficit hyperactivity disorder, predominantly inattentive type: Secondary | ICD-10-CM

## 2019-09-05 DIAGNOSIS — F411 Generalized anxiety disorder: Secondary | ICD-10-CM

## 2019-09-05 MED ORDER — BUPROPION HCL ER (XL) 150 MG PO TB24: 150.0000 mg | ORAL_TABLET | Freq: Every day | ORAL | 1 refills | Status: DC

## 2019-09-05 MED ORDER — AMPHETAMINE-DEXTROAMPHETAMINE 15 MG PO TABS: 15.0000 mg | ORAL_TABLET | Freq: Every day | ORAL | 0 refills | Status: DC

## 2019-09-05 NOTE — Progress Notes (Signed)
[facility] Med Check  Patient ID: [name],  MRN: [id number]  PCP: [name]  Date of Evaluation: 09/05/2019 Time spent:30 minutes  Chief Complaint:  Chief Complaint    Anxiety; Depression; Medication Refill     Virtual Visit via Telephone Note  I connected with patient by a video enabled telemedicine application or telephone, with their informed consent, and verified patient privacy and that I am speaking with the correct person using two identifiers.  I am private, in my office and the patient is at work.  I discussed the limitations, risks, security and privacy concerns of performing an evaluation and management service by telephone and the availability of in person appointments. I also discussed with the patient that there may be a patient responsible charge related to this service. The patient expressed understanding and agreed to proceed.   I discussed the assessment and treatment plan with the patient. The patient was provided an opportunity to ask questions and all were answered. The patient agreed with the plan and demonstrated an understanding of the instructions.   The patient was advised to call back or seek an in-person evaluation if the symptoms worsen or if the condition fails to improve as anticipated.  I provided 30 minutes of non-face-to-face time during this encounter.  HISTORY/CURRENT STATUS: HPI For routine med check.  Still depressed a lot.  States she feels really good in every other way, and that the lithium has helped the bipolar disorder a lot.  But she still gets sad often, cries easily, does not have much energy or motivation and has even missed a couple of days of work in the past few months because she just could not get out of bed to go.  There are days when she feels fine, but more days than not, she feels depressed.  She is wondering if the Cymbalta is not working at all anymore.  She denies suicidal or homicidal thoughts.  States  the Adderall is working well.  She is able to stay focused and on task.  No side effects from that medication.  Patient denies increased energy with decreased need for sleep, no increased talkativeness, no racing thoughts, no impulsivity or risky behaviors, no increased spending, no increased libido, no grandiosity.  Not feeling anxious but every now and then.  She is not taking the hydroxyzine at all and rarely takes the Xanax.  She seems to be sleeping okay.  Review of Systems  Constitutional: Positive for malaise/fatigue.  HENT: Negative.   Eyes: Negative.   Respiratory: Negative.   Cardiovascular: Negative.   Gastrointestinal: Negative.   Genitourinary: Negative.   Musculoskeletal: Negative.   Skin: Negative.   Neurological: Negative.   Endo/Heme/Allergies: Negative.   Psychiatric/Behavioral: Positive for depression. Negative for hallucinations, memory loss, substance abuse and suicidal ideas. The patient is not nervous/anxious and does not have insomnia.    Individual Medical History/ Review of Systems: Changes? :No    Past medications for mental health diagnoses include: Zoloft, trazodone, Xanax, Prozac, Vraylar, Cymbalta, Rexulti  Allergies: Oxycodone, Sulfa antibiotics, and Tamiflu [oseltamivir phosphate]  Current Medications:  Current Outpatient Medications:  .  ALPRAZolam (XANAX) 0.5 MG tablet, Take 1 tablet (0.5 mg total) by mouth 2 (two) times daily as needed for anxiety., Disp: 30 tablet, Rfl: 0 .  amphetamine-dextroamphetamine (ADDERALL) 15 MG tablet, Take 1 tablet by mouth daily., Disp: 30 tablet, Rfl: 0 .  amphetamine-dextroamphetamine (ADDERALL) 15 MG tablet, Take 1 tablet by mouth daily., Disp: 30 tablet, Rfl:  0 .  amphetamine-dextroamphetamine (ADDERALL) 15 MG tablet, Take 1 tablet by mouth daily., Disp: 30 tablet, Rfl: 0 .  DULoxetine (CYMBALTA) 60 MG capsule, TAKE 2 CAPSULES(120 MG) BY MOUTH DAILY, Disp: 60 capsule, Rfl: 1 .  esomeprazole (NEXIUM) 40 MG capsule,  Take 40-80 mg by mouth daily as needed (for acid reflux). , Disp: , Rfl:  .  lisinopril (PRINIVIL,ZESTRIL) 20 MG tablet, Take 20 mg by mouth every evening. , Disp: , Rfl:  .  lithium 300 MG tablet, Take 5 tablets (1,500 mg total) by mouth at bedtime. (Patient taking differently: Take 1,800 mg by mouth at bedtime. ), Disp: 150 tablet, Rfl: 1 .  VRAYLAR 6 MG CAPS, TAKE 1 CAPSULE(6 MG) BY MOUTH DAILY, Disp: 30 capsule, Rfl: 1 .  buPROPion (WELLBUTRIN XL) 150 MG 24 hr tablet, Take 1 tablet (150 mg total) by mouth daily., Disp: 30 tablet, Rfl: 1 .  hydrOXYzine (ATARAX/VISTARIL) 10 MG tablet, Take 0.5-2 tablets (5-20 mg total) by mouth every 6 (six) hours as needed. (Patient not taking: Reported on 03/30/2019), Disp: 60 tablet, Rfl: 1 Medication Side Effects: none  Family Medical/ Social History: Changes? No  MENTAL HEALTH EXAM:  There were no vitals taken for this visit.There is no height or weight on file to calculate BMI.  General Appearance: Unable to assess  Eye Contact:  Unable to assess  Speech:  Clear and Coherent  Volume:  Normal  Mood:  Euthymic  Affect:  Unable to assess  Thought Process:  Goal Directed and Descriptions of Associations: Intact  Orientation:  Full (Time, Place, and Person)  Thought Content: Logical   Suicidal Thoughts:  No  Homicidal Thoughts:  No  Memory:  WNL  Judgement:  Good  Insight:  Good  Psychomotor Activity:  Unable to assess  Concentration:  Concentration: Good and Attention Span: Good  Recall:  Good  Fund of Knowledge: Good  Language: Good  Assets:  Desire for Improvement  ADL's:  Intact  Cognition: WNL  Prognosis:  Good  08/06/2019, Li level was 0.9. on 1800 mg.  06/13/2019 hemoglobin A1c was 5.5, lithium level was 0.9, lipid panel was completely within normal limits, BMP was normal except glucose was 101, calcium was 10.3.  DIAGNOSES:    ICD-10-CM   1. Bipolar I disorder, most recent episode depressed ([place])  F31.30   2. Attention deficit  hyperactivity disorder (ADHD), predominantly inattentive type  F90.0   3. Generalized anxiety disorder  F41.1     Receiving Psychotherapy: Yes [name], [facility]   RECOMMENDATIONS:  I spent 30 minutes with her. PDMP was reviewed. I recommend that we add Wellbutrin or increase the lithium.  She thinks she has tried Wellbutrin in the past but she does not really remember for sure or what the effects were if she did.  Because it often helps with energy and motivation, I recommend this is a first choice.  She is willing to try.  We discussed benefits, risks, side effects and she accepts. Start Wellbutrin XL 150 mg, 1 p.o. every morning. Continue Adderall 15 mg, 1 p.o. every morning. Continue Xanax 0.5 mg, 1 twice daily as needed, take rarely. Continue Cymbalta 60 mg, 2 p.o. daily. Continue lithium 300 mg, 6 p.o. nightly. Continue Vraylar 6 mg daily. Continue psychotherapy with [name], [facility]. Return in 4 weeks.  Donnal Moat, PA-C

## 2019-09-06 ENCOUNTER — Ambulatory Visit: Payer: BC Managed Care – PPO | Admitting: Psychiatry

## 2019-09-16 ENCOUNTER — Other Ambulatory Visit: Payer: Self-pay | Admitting: Physician Assistant

## 2019-10-02 ENCOUNTER — Other Ambulatory Visit: Payer: Self-pay | Admitting: Physician Assistant

## 2019-10-09 ENCOUNTER — Ambulatory Visit: Payer: BC Managed Care – PPO | Admitting: Psychiatry

## 2019-10-16 ENCOUNTER — Encounter: Payer: Self-pay | Admitting: Physician Assistant

## 2019-10-16 ENCOUNTER — Ambulatory Visit (INDEPENDENT_AMBULATORY_CARE_PROVIDER_SITE_OTHER): Payer: BC Managed Care – PPO | Admitting: Physician Assistant

## 2019-10-16 DIAGNOSIS — F411 Generalized anxiety disorder: Secondary | ICD-10-CM | POA: Diagnosis not present

## 2019-10-16 DIAGNOSIS — F900 Attention-deficit hyperactivity disorder, predominantly inattentive type: Secondary | ICD-10-CM | POA: Diagnosis not present

## 2019-10-16 DIAGNOSIS — F3130 Bipolar disorder, current episode depressed, mild or moderate severity, unspecified: Secondary | ICD-10-CM | POA: Diagnosis not present

## 2019-10-16 MED ORDER — AMPHETAMINE-DEXTROAMPHETAMINE 15 MG PO TABS: 15.0000 mg | ORAL_TABLET | Freq: Every day | ORAL | 0 refills | Status: DC

## 2019-10-16 MED ORDER — ARIPIPRAZOLE 2 MG PO TABS: 2.0000 mg | ORAL_TABLET | Freq: Every day | ORAL | 1 refills | Status: DC

## 2019-10-16 MED ORDER — LITHIUM CARBONATE 600 MG PO CAPS: 600.0000 mg | ORAL_CAPSULE | Freq: Three times a day (TID) | ORAL | 1 refills | Status: DC

## 2019-10-16 NOTE — Progress Notes (Addendum)
[facility] Med Check  Patient ID: [name],  MRN: [id number]  PCP: [name]  Date of Evaluation: 10/16/2019 Time spent:20 minutes  Chief Complaint:  Chief Complaint    ADD     Virtual Visit via Telephone Note  I connected with patient by a video enabled telemedicine application or telephone, with their informed consent, and verified patient privacy and that I am speaking with the correct person using two identifiers.  I am private, in my office and the patient is home.  I discussed the limitations, risks, security and privacy concerns of performing an evaluation and management service by telephone and the availability of in person appointments. I also discussed with the patient that there may be a patient responsible charge related to this service. The patient expressed understanding and agreed to proceed.   I discussed the assessment and treatment plan with the patient. The patient was provided an opportunity to ask questions and all were answered. The patient agreed with the plan and demonstrated an understanding of the instructions.   The patient was advised to call back or seek an in-person evaluation if the symptoms worsen or if the condition fails to improve as anticipated.  I provided 20 minutes of non-face-to-face time during this encounter.  HISTORY/CURRENT STATUS: HPI For routine med check.  We added Wellbutrin at LOV, but she wasn't able to tolerate it.  Made her anxious.  She stopped it a week ago and hasn't had any withdrawals.  She still feels somewhat depressed.  Does not have a lot of energy or motivation.  Some days she feels down and does not really know why.  She has had to call out of work a couple of times in the past month or so because she just could not make herself get up and go.  She sleeps good and usually feels rested when she gets up.  Denies suicidal or homicidal thoughts.  Denies any manic symptoms.  No increased energy with  decreased need for sleep.  No impulsivity, risky behavior, grandiosity, increased spending, increased libido, increased irritability or anger, and no hallucinations.  She rarely takes the Xanax.  She has it on hand if she does need it but it has been a very long time since she has had to take one.  Adderall continues to be helpful.  She is more able to focus and finish tasks in a timely manner.  She does not have as much of a problem getting started on things that she enjoys doing.  Denies dizziness, syncope, seizures, numbness, tingling, tremor, tics, unsteady gait, slurred speech, confusion. Denies muscle or joint pain, stiffness, or dystonia.  Individual Medical History/ Review of Systems: Changes? :No    Past medications for mental health diagnoses include: Zoloft, trazodone, Xanax, Prozac, Vraylar, Cymbalta, Rexulti, Wellbutrin caused anxiety  Allergies: Oxycodone, Sulfa antibiotics, and Tamiflu [oseltamivir phosphate]  Current Medications:  Current Outpatient Medications:  .  ALPRAZolam (XANAX) 0.5 MG tablet, Take 1 tablet (0.5 mg total) by mouth 2 (two) times daily as needed for anxiety., Disp: 30 tablet, Rfl: 0 .  [START ON 12/10/2019] amphetamine-dextroamphetamine (ADDERALL) 15 MG tablet, Take 1 tablet by mouth daily., Disp: 30 tablet, Rfl: 0 .  [START ON 11/11/2019] amphetamine-dextroamphetamine (ADDERALL) 15 MG tablet, Take 1 tablet by mouth daily., Disp: 30 tablet, Rfl: 0 .  amphetamine-dextroamphetamine (ADDERALL) 15 MG tablet, Take 1 tablet by mouth daily., Disp: 30 tablet, Rfl: 0 .  DULoxetine (CYMBALTA) 60 MG capsule, TAKE 2 CAPSULES(120 MG)  BY MOUTH DAILY, Disp: 60 capsule, Rfl: 1 .  esomeprazole (NEXIUM) 40 MG capsule, Take 40-80 mg by mouth daily as needed (for acid reflux). , Disp: , Rfl:  .  lisinopril (PRINIVIL,ZESTRIL) 20 MG tablet, Take 20 mg by mouth every evening. , Disp: , Rfl:  .  VRAYLAR 6 MG CAPS, TAKE 1 CAPSULE(6 MG) BY MOUTH DAILY, Disp: 30 capsule, Rfl: 1 .   ARIPiprazole (ABILIFY) 2 MG tablet, Take 1 tablet (2 mg total) by mouth daily., Disp: 30 tablet, Rfl: 1 .  lithium 600 MG capsule, Take 1 capsule (600 mg total) by mouth 3 (three) times daily with meals., Disp: 90 capsule, Rfl: 1 Medication Side Effects: none  Family Medical/ Social History: Changes? No  MENTAL HEALTH EXAM:  There were no vitals taken for this visit.There is no height or weight on file to calculate BMI.  General Appearance: Unable to assess  Eye Contact:  Unable to assess  Speech:  Clear and Coherent and Normal Rate  Volume:  Normal  Mood:  Euthymic  Affect:  Unable to assess  Thought Process:  Goal Directed and Descriptions of Associations: Intact  Orientation:  Full (Time, Place, and Person)  Thought Content: Logical   Suicidal Thoughts:  No  Homicidal Thoughts:  No  Memory:  WNL  Judgement:  Good  Insight:  Good  Psychomotor Activity:  Unable to assess  Concentration:  Concentration: Good and Attention Span: Good  Recall:  Good  Fund of Knowledge: Good  Language: Good  Assets:  Desire for Improvement  ADL's:  Intact  Cognition: WNL  Prognosis:  Good  08/06/2019 lithium level was 0.9.  DIAGNOSES:    ICD-10-CM   1. Bipolar I disorder, most recent episode depressed ([place])  F31.30   2. Attention deficit hyperactivity disorder (ADHD), predominantly inattentive type  F90.0   3. Generalized anxiety disorder  F41.1     Receiving Psychotherapy: Yes [name], [facility].   RECOMMENDATIONS:  PDMP was reviewed. I spent 20 minutes with her. We discussed different options to help with the depression.  Even though she is on Vraylar and the cycles of bipolar disorder are controlled, she still is below her baseline as far as the mood goes.  Wellbutrin did not work out so I would recommend that we add Abilify at a very low dose.  We discussed benefits, risks, side effects and she accepts.  She should specifically watch for any movement disorders and call and let me know  if she has a tremor or akathisia, symptoms were described to her.  Also we will continue to monitor for metabolic problems.  Her last labs for glucose and lipid panel were in November.  She would like to try the Abilify.  Discontinue Wellbutrin, she has already done that. Start Abilify 2 mg, 1 p.o. every morning. Continue Adderall 15 mg, 1 p.o. every morning. Continue Cymbalta 60 mg, 2 p.o. daily. Continue Vraylar 6 mg, 1 p.o. every morning. Continue lithium 1800 mg.  I am changing 300 mg tablets to the 600 mg capsules. Continue therapy with [name], [facility] C. Return in 6 weeks.  Check Calcium w/ next Lithium.  Donnal Moat, PA-C

## 2019-10-17 ENCOUNTER — Other Ambulatory Visit: Payer: Self-pay

## 2019-10-17 ENCOUNTER — Ambulatory Visit (INDEPENDENT_AMBULATORY_CARE_PROVIDER_SITE_OTHER): Payer: BC Managed Care – PPO | Admitting: Psychiatry

## 2019-10-17 DIAGNOSIS — F3181 Bipolar II disorder: Secondary | ICD-10-CM

## 2019-10-17 NOTE — Progress Notes (Signed)
[facility] Counselor/Therapist Progress Note  Patient ID: [name], MRN: [id number],    Date: 10/17/2019  Time Spent: 46 minutes start time 3:05 PM and time 3:51 pm  Treatment Type: Individual Therapy  Reported Symptoms: risky behavior, sadness, body image issues, anxiety  Mental Status Exam:  Appearance:   Well Groomed     Behavior:  Appropriate  Motor:  Normal  Speech/Language:   Normal Rate  Affect:  Appropriate  Mood:  anxious  Thought process:  normal  Thought content:    WNL  Sensory/Perceptual disturbances:    WNL  Orientation:  oriented to person, place, time/date and situation  Attention:  Good  Concentration:  Good  Memory:  WNL  Fund of knowledge:   Good  Insight:    Good  Judgment:   Good  Impulse Control:  Good   Risk Assessment: Danger to Self:  No Self-injurious Behavior: No Danger to Others: No Duty to Warn:no Physical Aggression / Violence:No  Access to Firearms a concern: No  Gang Involvement:No   Subjective: Patient was present for session.  She reported overall she has been doing better.  Patient went on to explain that she went off her lithium because she was tired of taking it in the way it made her feel without consulting her provider.  She went on to explain that during that.  She sent another email to the person from the past.  Patient shared that her husband found out and then found out that she had stopped her medication and got her back on track no let her know that it cannot happen again.  Patient explained she wanted to know how to make sure that it did not happen again.  Discussed the fact that she did not have the manic behavior when she is taking her medication correctly so she has to talk herself through taking her medication as directed.  Discussed different CBT filters to help her make that decision even when she does not feel like it.  Patient also shared she gets nauseated after taking it which makes it hard discussed using  essential oils to help with the nausea as well as sea bands to try and stop the nausea.  Was encouraged to remind herself of why she takes the medication is to make sure she has her marriage and gets to see her son graduate.  Patient reported she is also recognizing that she needs some spark sometimes in her life.  Encouraged her to talk to her husband about them alternating setting up a date night's and surprisingly other at least once a month.  Patient reported that would give her something to look forward to so she felt that was a positive plan.  Shared she has continued to walk and is planning on getting on her exercise bike.  She is considering a weight loss plan because she still feels bad about all the weight she has gained.  Patient was encouraged to do what she feels would help her feel more positive about herself.  Patient stated at this time since overall she is been going well and she will be staying on her medication that she wants to go 2 months before she comes to next session.  Interventions: Cognitive Behavioral Therapy and Solution-Oriented/Positive Psychology  Diagnosis:   ICD-10-CM   1. Bipolar II disorder (HCC)  F31.81     Plan: Patient is to use CBT and coping skills to decrease depression symptoms.  Patient is to  use her CBT filters to make sure she takes her medication as directed every day.  Patient is to try the essential oils and see bands to try and decrease nausea associated to medications.  Patient is to talk to her husband about plans concerning once a month date nights.  Patient is to also continue working on diet and exercise to help decrease depression symptoms. Long-term goal: Develop the ability to recognize accept and cope with feelings of depression Term goal: Utilize behavioral strategies to overcome depression Identify and replace depressive thinking that leads to depression feelings and actions  [name], [facility]

## 2019-10-31 ENCOUNTER — Institutional Professional Consult (permissible substitution): Payer: BC Managed Care – PPO | Admitting: Pulmonary Disease

## 2019-11-01 ENCOUNTER — Ambulatory Visit: Payer: BC Managed Care – PPO | Admitting: Psychiatry

## 2019-11-21 ENCOUNTER — Ambulatory Visit: Payer: BC Managed Care – PPO | Admitting: Pulmonary Disease

## 2019-11-21 ENCOUNTER — Other Ambulatory Visit: Payer: Self-pay

## 2019-11-21 ENCOUNTER — Encounter: Payer: Self-pay | Admitting: Pulmonary Disease

## 2019-11-21 VITALS — BP 132/82 | HR 110 | Temp 98.5°F | Ht 63.0 in | Wt 246.2 lb

## 2019-11-21 DIAGNOSIS — G4733 Obstructive sleep apnea (adult) (pediatric): Secondary | ICD-10-CM | POA: Diagnosis not present

## 2019-11-21 NOTE — Patient Instructions (Signed)
Contact [facility] to obtain CPAP compliance  Prescription to [facility] for CPAP supplies  Call us in about a week to let us know how you are doing, by then we should have your CPAP compliance  As you are still sleepy despite compliance with CPAP Increasing stimulant medications will be the option of treatment  Tentatively we will follow-up in 6 months if you are doing okay

## 2019-11-21 NOTE — Progress Notes (Signed)
[name]    [id number]    05-16-1971  Primary Care Physician:Mann, Yetta Glassman, PA-C  Referring Physician: Shawnie Dapper, PA-C [address] [place],  [place] [address]  Chief complaint:  Patient with a history of mild obstructive sleep apnea with excessive daytime sleepiness HPI:  Was diagnosed with obstructive sleep apnea in 2017 has been using CPAP since  Has not followed up recently as she has been doing very well but continues to feel sleepy during the day  Usually goes to bed about 10 PM, sleeps till 7 AM about 2-3 awakenings  She feels her CPAP works well but she is still tired  She is on Adderall for ADHD Cymbalta  She usually is able to function well but she is having daytime sleepiness  No dryness of her mouth, she feels her sleep is not as restorative as it used to be   Outpatient Encounter Medications as of 11/21/2019  Medication Sig  . ALPRAZolam (XANAX) 0.5 MG tablet Take 1 tablet (0.5 mg total) by mouth 2 (two) times daily as needed for anxiety.  Melene Muller ON 12/10/2019] amphetamine-dextroamphetamine (ADDERALL) 15 MG tablet Take 1 tablet by mouth daily.  Marland Kitchen amphetamine-dextroamphetamine (ADDERALL) 15 MG tablet Take 1 tablet by mouth daily.  Marland Kitchen amphetamine-dextroamphetamine (ADDERALL) 15 MG tablet Take 1 tablet by mouth daily.  . ARIPiprazole (ABILIFY) 2 MG tablet Take 1 tablet (2 mg total) by mouth daily.  . DULoxetine (CYMBALTA) 60 MG capsule TAKE 2 CAPSULES(120 MG) BY MOUTH DAILY  . esomeprazole (NEXIUM) 40 MG capsule Take 40-80 mg by mouth daily as needed (for acid reflux).   Marland Kitchen lisinopril (PRINIVIL,ZESTRIL) 20 MG tablet Take 20 mg by mouth every evening.   . lithium 600 MG capsule Take 1 capsule (600 mg total) by mouth 3 (three) times daily with meals.  Marland Kitchen VRAYLAR 6 MG CAPS TAKE 1 CAPSULE(6 MG) BY MOUTH DAILY   No facility-administered encounter medications on file as of 11/21/2019.    Allergies as of 11/21/2019 - Review Complete  11/21/2019  Allergen Reaction Noted  . Oxycodone Hives 12/17/2013  . Sulfa antibiotics Hives 12/17/2013  . Tamiflu [oseltamivir phosphate] Hives 12/17/2013    Past Medical History:  Diagnosis Date  . Anxiety   . Asthma   . Attention deficit hyperactivity disorder (ADHD), predominantly inattentive type 06/13/2018  . Bipolar 1 disorder, mixed (HCC) 06/13/2018  . Contact lens/glasses fitting    wears contacta or glasses  . Depression   . Fatigue   . GERD (gastroesophageal reflux disease)   . Headache   . Hypertension   . PONV (postoperative nausea and vomiting)   . Tear of medial meniscus of right knee 12/21/2013    Past Surgical History:  Procedure Laterality Date  . CHOLECYSTECTOMY  2008   lap choli  . DILATION AND CURETTAGE OF UTERUS    . ERCP  2008  . KNEE ARTHROSCOPY WITH MEDIAL MENISECTOMY Left 12/21/2013   Procedure: LEFT KNEE ARTHROSCOPY WITH MEDIAL MENISECTOMY;  Surgeon: Eulas Post, MD;  Location: [facility];  Service: Orthopedics;  Laterality: Left;  . TONSILLECTOMY    . VAGINAL HYSTERECTOMY  2012   loa    Family History  Problem Relation Age of Onset  . Depression Mother   . Drug abuse Father   . Tourette syndrome Father   . Depression Sister   . Anxiety disorder Sister   . Alcohol abuse Paternal Uncle  Social History   Socioeconomic History  . Marital status: Married    Spouse name: Marlou Sa  . Number of children: 1  . Years of education: Not on file  . Highest education level: Master's degree (e.g., MA, MS, MEng, MEd, MSW, MBA)  Occupational History  . 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  Tobacco Use  . Smoking status: Never Smoker  . Smokeless tobacco: Never Used  Substance and Sexual Activity  . Alcohol use: Never  . Drug use: Never  . Sexual activity: Yes    Partners: Male    Birth control/protection: None, Surgical    Comment: married  Other Topics Concern  . Not on file  Social History Narrative  . Not  on file   Social Determinants of Health   Financial Resource Strain:   . Difficulty of Paying Living Expenses:   Food Insecurity:   . Worried About Charity fundraiser in the Last Year:   . Arboriculturist in the Last Year:   Transportation Needs:   . Film/video editor (Medical):   Marland Kitchen Lack of Transportation (Non-Medical):   Physical Activity:   . Days of Exercise per Week:   . Minutes of Exercise per Session:   Stress:   . Feeling of Stress :   Social Connections:   . Frequency of Communication with Friends and Family:   . Frequency of Social Gatherings with Friends and Family:   . Attends Religious Services:   . Active Member of Clubs or Organizations:   . Attends Archivist Meetings:   Marland Kitchen Marital Status:   Intimate Partner Violence:   . Fear of Current or Ex-Partner:   . Emotionally Abused:   Marland Kitchen Physically Abused:   . Sexually Abused:     Review of Systems  Constitutional: Positive for fatigue.  Respiratory: Positive for apnea.   Psychiatric/Behavioral: Positive for sleep disturbance.    Vitals:   11/21/19 1542  BP: 132/82  Pulse: (!) 110  Temp: 98.5 F (36.9 C)  SpO2: 98%     Physical Exam  Constitutional: She appears well-developed.  Obese  HENT:  Head: Normocephalic.  Mallampati 3, crowded oropharynx  Eyes: Pupils are equal, round, and reactive to light.  Cardiovascular: Normal rate and regular rhythm.  Pulmonary/Chest: Effort normal and breath sounds normal. No respiratory distress. She has no wheezes. She has no rales. She exhibits no tenderness.  Musculoskeletal:        General: No edema. Normal range of motion.     Cervical back: Normal range of motion and neck supple.  Neurological: She is alert.  Skin: Skin is warm.  Psychiatric: She has a normal mood and affect.   Data Reviewed: Previous sleep study from 2017 reviewed Mild obstructive sleep apnea adequately treated with CPAP therapy-she is on CPAP auto titrating 5-15  Results  of the Epworth flowsheet 11/21/2019 04/15/2016  Sitting and reading 2 1  Watching TV 3 1  Sitting, inactive in a public place (e.g. a theatre or a meeting) 0 1  As a passenger in a car for an hour without a break 2 2  Lying down to rest in the afternoon when circumstances permit 3 3  Sitting and talking to someone 0 1  Sitting quietly after a lunch without alcohol 1 2  In a car, while stopped for a few minutes in traffic 0 0  Total score 11 11    Assessment:  Obstructive sleep apnea  Excessive daytime sleepiness  ADHD  Obesity -Encouraged weight loss efforts  Plan/Recommendations: .  CPAP supplies  .  Obtain download from CPAP  .  Once working confirm adequately treated sleep disordered breathing, excessive daytime sleepiness, treated with stimulants .  Patient already on Adderall, may need increased doses  .  Follow-up in 6 months  Virl Diamond MD [facility] 11/21/2019, 4:09 PM  CC: Shawnie Dapper, PA-C

## 2019-12-15 ENCOUNTER — Other Ambulatory Visit: Payer: Self-pay | Admitting: Physician Assistant

## 2019-12-23 ENCOUNTER — Other Ambulatory Visit: Payer: Self-pay | Admitting: Physician Assistant

## 2019-12-27 ENCOUNTER — Encounter: Payer: Self-pay | Admitting: Pulmonary Disease

## 2019-12-27 ENCOUNTER — Telehealth: Payer: Self-pay | Admitting: Pulmonary Disease

## 2019-12-27 NOTE — Telephone Encounter (Signed)
Currently call patient to find out why compliance has fallen off  CPAP compliance has fallen off in the last couple of months

## 2019-12-28 ENCOUNTER — Encounter (INDEPENDENT_AMBULATORY_CARE_PROVIDER_SITE_OTHER): Payer: Self-pay

## 2019-12-28 ENCOUNTER — Encounter: Payer: Self-pay | Admitting: Physician Assistant

## 2019-12-28 ENCOUNTER — Ambulatory Visit (INDEPENDENT_AMBULATORY_CARE_PROVIDER_SITE_OTHER): Payer: BC Managed Care – PPO | Admitting: Physician Assistant

## 2019-12-28 ENCOUNTER — Other Ambulatory Visit: Payer: Self-pay

## 2019-12-28 DIAGNOSIS — F900 Attention-deficit hyperactivity disorder, predominantly inattentive type: Secondary | ICD-10-CM | POA: Diagnosis not present

## 2019-12-28 DIAGNOSIS — F411 Generalized anxiety disorder: Secondary | ICD-10-CM

## 2019-12-28 DIAGNOSIS — F319 Bipolar disorder, unspecified: Secondary | ICD-10-CM | POA: Diagnosis not present

## 2019-12-28 DIAGNOSIS — Z79899 Other long term (current) drug therapy: Secondary | ICD-10-CM

## 2019-12-28 MED ORDER — ARIPIPRAZOLE 2 MG PO TABS: ORAL_TABLET | ORAL | 1 refills | Status: DC

## 2019-12-28 MED ORDER — VRAYLAR 6 MG PO CAPS: ORAL_CAPSULE | ORAL | 1 refills | Status: DC

## 2019-12-28 MED ORDER — DULOXETINE HCL 60 MG PO CPEP: ORAL_CAPSULE | ORAL | 1 refills | Status: DC

## 2019-12-28 MED ORDER — LITHIUM CARBONATE 600 MG PO CAPS: ORAL_CAPSULE | ORAL | 1 refills | Status: DC

## 2019-12-28 MED ORDER — AMPHETAMINE-DEXTROAMPHETAMINE 20 MG PO TABS: 20.0000 mg | ORAL_TABLET | Freq: Every day | ORAL | 0 refills | Status: DC

## 2019-12-28 NOTE — Telephone Encounter (Signed)
Attempted to call patient, left message for patient to call back.  

## 2019-12-28 NOTE — Progress Notes (Signed)
[facility]  Patient ID: [name],  MRN: [id number]  PCP: Ginger Organ  Date of Evaluation: 12/28/2019 Time spent:20 minutes  Chief Complaint:  Chief Complaint    Follow-up     HISTORY/CURRENT STATUS: HPI For routine med check.  [name] has been a whole lot better since we added Abilify several months back.  She is able to enjoy things more, energy and motivation are better, and she does not feel as down as she did previously.  She is happy to be out of school for the summer.  She is in education.  Her job was cut but then she was able to get in to a position that she has wanted for a long time so it has been a blessing how it all worked out.  "The old [name] would have been sobbing more irritable I would be so upset.  But because the medications are working I handled it well."  Sometime in the past few months, she had a 2 to 3-week episode where she felt more depressed but not near as severe as she has had in the past.  Also during that time she felt more manic, was more impulsive and wanted to shop more.  But that was all.  It did not last very long.  There were no hallucinations, paranoia or suicidal thoughts.  She rarely takes the Xanax.  She has it on hand if she does need it but it has been a very long time since she has had to take one.  Adderall continues to be helpful.  But she does feel it does not work as well as it did in the past.  She is wondering if she might need a higher dose.  Denies dizziness, syncope, seizures, numbness, tingling, tremor, tics, unsteady gait, slurred speech, confusion. Denies muscle or joint pain, stiffness, or dystonia.  She saw her PCP earlier this week.  This was for a routine checkup.  Labs were drawn but she does not have the results yet.  She is unsure if a lithium level was drawn with the other labs.  Individual Medical History/ Review of Systems: Changes? :No    Past medications for mental health diagnoses  include: Zoloft, trazodone, Xanax, Prozac, Vraylar, Cymbalta, Rexulti, Wellbutrin caused anxiety  Allergies: Oxycodone, Sulfa antibiotics, and Tamiflu [oseltamivir phosphate]  Current Medications:  Current Outpatient Medications:  .  ALPRAZolam (XANAX) 0.5 MG tablet, Take 1 tablet (0.5 mg total) by mouth 2 (two) times daily as needed for anxiety., Disp: 30 tablet, Rfl: 0 .  ARIPiprazole (ABILIFY) 2 MG tablet, TAKE 1 TABLET(2 MG) BY MOUTH DAILY, Disp: 90 tablet, Rfl: 1 .  Cariprazine HCl (VRAYLAR) 6 MG CAPS, TAKE 1 CAPSULE(6 MG) BY MOUTH DAILY, Disp: 90 capsule, Rfl: 1 .  DULoxetine (CYMBALTA) 60 MG capsule, TAKE 2 CAPSULES(120 MG) BY MOUTH DAILY, Disp: 180 capsule, Rfl: 1 .  esomeprazole (NEXIUM) 40 MG capsule, Take 40-80 mg by mouth daily as needed (for acid reflux). , Disp: , Rfl:  .  lisinopril (PRINIVIL,ZESTRIL) 20 MG tablet, Take 20 mg by mouth every evening. , Disp: , Rfl:  .  lithium 600 MG capsule, TAKE 1 CAPSULE(600 MG) BY MOUTH THREE TIMES DAILY WITH MEALS, Disp: 270 capsule, Rfl: 1 .  [START ON 01/26/2020] amphetamine-dextroamphetamine (ADDERALL) 20 MG tablet, Take 1 tablet (20 mg total) by mouth daily., Disp: 30 tablet, Rfl: 0 .  amphetamine-dextroamphetamine (ADDERALL) 20 MG tablet, Take 1 tablet (20 mg total) by mouth daily., Disp:  30 tablet, Rfl: 0 Medication Side Effects: none  Family Medical/ Social History: Changes? No  MENTAL HEALTH EXAM:  There were no vitals taken for this visit.There is no height or weight on file to calculate BMI.  General Appearance: Casual, Neat, Well Groomed and Obese  Eye Contact:  Good  Speech:  Clear and Coherent and Normal Rate  Volume:  Normal  Mood:  Euthymic  Affect:  Appropriate  Thought Process:  Goal Directed and Descriptions of Associations: Intact  Orientation:  Full (Time, Place, and Person)  Thought Content: Logical   Suicidal Thoughts:  No  Homicidal Thoughts:  No  Memory:  WNL  Judgement:  Good  Insight:  Good  Psychomotor  Activity:  Normal  Concentration:  Concentration: Good and Attention Span: Fair  Recall:  Good  Fund of Knowledge: Good  Language: Good  Assets:  Desire for Improvement  ADL's:  Intact  Cognition: WNL  Prognosis:  Good    DIAGNOSES:    ICD-10-CM   1. Bipolar I disorder (HCC)  F31.9   2. Encounter for long-term (current) use of medications  Z79.899 Lithium level  3. Attention deficit hyperactivity disorder (ADHD), predominantly inattentive type  F90.0   4. Generalized anxiety disorder  F41.1     Receiving Psychotherapy: Yes [name], [facility].   RECOMMENDATIONS:  PDMP was reviewed. I am glad she is doing so well on the Abilify! It does sound like the Adderall is not working as well so an increase in dose is appropriate. Continue Abilify 2 mg, 1 p.o. every morning. Increase Adderall to 20 mg 1 p.o. every morning. Continue Cymbalta 60 mg, 2 p.o. daily. Continue Vraylar 6 mg, 1 p.o. every morning. Continue lithium 1800 mg.  I am changing 300 mg tablets to the 600 mg capsules.  (She is taking 600 mg 1 p.o. 3 times daily.) She will request labs from her PCP to be sent to me.  If a lithium level was not drawn, I gave her the order today to have it done sometime in the next couple of months.   Continue therapy with [name], [facility]. Return in 2 months.  Renee Overly, PA-C

## 2020-01-02 NOTE — Telephone Encounter (Signed)
Pt returning a phone call. Pt can be reached at [phone].

## 2020-01-02 NOTE — Telephone Encounter (Signed)
Spoke with patient. She stated that she has not been wearing her cpap machine due to an uncomfortable mask. She is the process of working [place] to get a new mask. As soon as she gets the new mask, she will go back to wearing it each night.   I advised her if that she continued to have issues with her mask to let us know. She verbalized understanding. Nothing further needed at time of call.

## 2020-01-03 ENCOUNTER — Telehealth: Payer: Self-pay | Admitting: Physician Assistant

## 2020-01-03 NOTE — Telephone Encounter (Signed)
Patient called and said that she hasd her blood work done and that her platelet level was 475 and she also wants to discuss he lthium level with teresa. She is worried about the platlet level [phone]

## 2020-01-04 NOTE — Telephone Encounter (Signed)
Contacted [place] and she will call her PCP to get labs faxed over for [name] to review.

## 2020-01-04 NOTE — Telephone Encounter (Signed)
No,I haven't. 

## 2020-01-09 ENCOUNTER — Other Ambulatory Visit: Payer: Self-pay | Admitting: Obstetrics and Gynecology

## 2020-01-09 DIAGNOSIS — N644 Mastodynia: Secondary | ICD-10-CM

## 2020-01-10 LAB — LITHIUM LEVEL: Lithium Lvl: 0.4 mmol/L — ABNORMAL LOW (ref 0.5–1.2)

## 2020-01-10 NOTE — Telephone Encounter (Signed)
I'm not aware of any of the psych meds increasing the plt count.  (In my opinion, they do need to be rechecked, but they're not that high)

## 2020-01-10 NOTE — Progress Notes (Signed)
Li level is low. She is taking it as directed right?  I don't think we should increase dose as long as she's still doing well.

## 2020-01-10 NOTE — Progress Notes (Signed)
From psych perspective, all labs are within nl range, specifically: CBC, kidney functions, Glul, Lipids, TSH

## 2020-01-24 ENCOUNTER — Ambulatory Visit
Admission: RE | Admit: 2020-01-24 | Discharge: 2020-01-24 | Disposition: A | Discharge status: Home / Self Care | Payer: BC Managed Care – PPO | Source: Ambulatory Visit | Attending: Obstetrics and Gynecology | Admitting: Obstetrics and Gynecology

## 2020-01-24 ENCOUNTER — Ambulatory Visit: Payer: BC Managed Care – PPO

## 2020-01-24 ENCOUNTER — Other Ambulatory Visit: Payer: Self-pay

## 2020-01-24 DIAGNOSIS — N644 Mastodynia: Secondary | ICD-10-CM

## 2020-01-31 ENCOUNTER — Other Ambulatory Visit: Payer: Self-pay | Admitting: Physician Assistant

## 2020-01-31 DIAGNOSIS — Z79899 Other long term (current) drug therapy: Secondary | ICD-10-CM

## 2020-01-31 MED ORDER — LITHIUM CARBONATE 300 MG PO CAPS: 300.0000 mg | ORAL_CAPSULE | Freq: Every day | ORAL | 1 refills | Status: DC

## 2020-01-31 NOTE — Progress Notes (Signed)
Please let her know to increase the lithium to a total of 2100 mg.  I have sent in a prescription for 300 mg for her to add to the current regimen.  I sent that to the [facility] on [address] in [place]. She will need a lithium level in 5 to 7 days.  That order will be mailed to her.

## 2020-02-25 ENCOUNTER — Telehealth: Payer: Self-pay

## 2020-02-25 NOTE — Telephone Encounter (Signed)
Prior authorization submitted and approved for AMPHETAMINE-DEXTROAMPHETAMINE 20 MG #30 with [facility] ID# [id number], effective 02/25/2020-02/25/2023.

## 2020-02-27 ENCOUNTER — Encounter: Payer: Self-pay | Admitting: Physician Assistant

## 2020-02-27 ENCOUNTER — Other Ambulatory Visit: Payer: Self-pay

## 2020-02-27 ENCOUNTER — Ambulatory Visit (INDEPENDENT_AMBULATORY_CARE_PROVIDER_SITE_OTHER): Payer: BC Managed Care – PPO | Admitting: Physician Assistant

## 2020-02-27 VITALS — BP 162/104 | HR 86

## 2020-02-27 DIAGNOSIS — F411 Generalized anxiety disorder: Secondary | ICD-10-CM | POA: Diagnosis not present

## 2020-02-27 DIAGNOSIS — F900 Attention-deficit hyperactivity disorder, predominantly inattentive type: Secondary | ICD-10-CM | POA: Diagnosis not present

## 2020-02-27 DIAGNOSIS — R112 Nausea with vomiting, unspecified: Secondary | ICD-10-CM | POA: Diagnosis not present

## 2020-02-27 DIAGNOSIS — F3130 Bipolar disorder, current episode depressed, mild or moderate severity, unspecified: Secondary | ICD-10-CM | POA: Diagnosis not present

## 2020-02-27 MED ORDER — LITHIUM CARBONATE ER 450 MG PO TBCR: 900.0000 mg | EXTENDED_RELEASE_TABLET | Freq: Two times a day (BID) | ORAL | 2 refills | Status: DC

## 2020-02-27 MED ORDER — ARIPIPRAZOLE 5 MG PO TABS: 5.0000 mg | ORAL_TABLET | Freq: Every day | ORAL | 2 refills | Status: DC

## 2020-02-27 MED ORDER — AMPHETAMINE-DEXTROAMPHETAMINE 20 MG PO TABS: 20.0000 mg | ORAL_TABLET | Freq: Every day | ORAL | 0 refills | Status: DC

## 2020-02-27 MED ORDER — AMPHETAMINE-DEXTROAMPHETAMINE 20 MG PO TABS
20.0000 mg | ORAL_TABLET | Freq: Every day | ORAL | 0 refills | Status: DC
Start: 2020-03-21 — End: 2020-05-28

## 2020-02-27 NOTE — Progress Notes (Signed)
[facility]  Patient ID: [name],  MRN: [id number]  PCP: [name]  Date of Evaluation: 02/27/2020 Time spent:30 minutes  Chief Complaint:  Chief Complaint    Depression     HISTORY/CURRENT STATUS: HPI For routine med check.  States she is doing really well overall.  Since adding the Abilify, she has felt a lot better.  She is wondering if increasing the dose a little will be more helpful.  She still feels a little bit depressed, where she will get sad, and feel blue, but she still functions well, energy and motivation are good and she is enjoying things that she has always liked to do.  She is also looking forward to going back to school next week.  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  Denies suicidal or homicidal thoughts.  She rarely takes the Xanax.  She has it on hand if she does need it but it has been a very long time since she has had to take one.  Adderall continues to be helpful.  She is able to focus and get things done in a timely manner without a lot of distractions.  Patient denies increased energy with decreased need for sleep, no increased talkativeness, no racing thoughts, no impulsivity or risky behaviors, no increased spending, no increased libido, no grandiosity, no increased irritability or anger, and no hallucinations.  Denies dizziness, syncope, seizures, numbness, tingling, tremor, tics, unsteady gait, slurred speech, confusion. Denies muscle or joint pain, stiffness, or dystonia.  Individual Medical History/ Review of Systems: Changes? :No    Past medications for mental health diagnoses include: Zoloft, trazodone, Xanax, Prozac, Vraylar, Cymbalta, Rexulti, Wellbutrin caused anxiety  Allergies: Oxycodone, Sulfa antibiotics, and Tamiflu [oseltamivir phosphate]  Current Medications:  Current Outpatient  Medications:  .  ALPRAZolam (XANAX) 0.5 MG tablet, Take 1 tablet (0.5 mg total) by mouth 2 (two) times daily as needed for anxiety., Disp: 30 tablet, Rfl: 0 .  [START ON 05/19/2020] amphetamine-dextroamphetamine (ADDERALL) 20 MG tablet, Take 1 tablet (20 mg total) by mouth daily., Disp: 30 tablet, Rfl: 0 .  [START ON 04/19/2020] amphetamine-dextroamphetamine (ADDERALL) 20 MG tablet, Take 1 tablet (20 mg total) by mouth daily., Disp: 30 tablet, Rfl: 0 .  Cariprazine HCl (VRAYLAR) 6 MG CAPS, TAKE 1 CAPSULE(6 MG) BY MOUTH DAILY, Disp: 90 capsule, Rfl: 1 .  DULoxetine (CYMBALTA) 60 MG capsule, TAKE 2 CAPSULES(120 MG) BY MOUTH DAILY, Disp: 180 capsule, Rfl: 1 .  esomeprazole (NEXIUM) 40 MG capsule, Take 40-80 mg by mouth daily as needed (for acid reflux). , Disp: , Rfl:  .  lisinopril (PRINIVIL,ZESTRIL) 20 MG tablet, Take 20 mg by mouth every evening. , Disp: , Rfl:  .  [START ON 03/21/2020] amphetamine-dextroamphetamine (ADDERALL) 20 MG tablet, Take 1 tablet (20 mg total) by mouth daily., Disp: 30 tablet, Rfl: 0 .  ARIPiprazole (ABILIFY) 5 MG tablet, Take 1 tablet (5 mg total) by mouth daily., Disp: 30 tablet, Rfl: 2 .  lithium carbonate (ESKALITH) 450 MG CR tablet, Take 2 tablets (900 mg total) by mouth 2 (two) times daily., Disp: 120 tablet, Rfl: 2 Medication Side Effects: nausea nausea can be so severe at times she has thrown up.  She has to space out the lithium, she is unable to take all at the same time in the evening due to that.  Family Medical/  Social History: Changes? No  MENTAL HEALTH EXAM:  Blood pressure (!) 162/104, pulse 86.There is no height or weight on file to calculate BMI.  General Appearance: Casual, Neat, Well Groomed and Obese  Eye Contact:  Good  Speech:  Clear and Coherent and Normal Rate  Volume:  Normal  Mood:  Euthymic  Affect:  Appropriate  Thought Process:  Goal Directed and Descriptions of Associations: Intact  Orientation:  Full (Time, Place, and Person)  Thought  Content: Logical   Suicidal Thoughts:  No  Homicidal Thoughts:  No  Memory:  WNL  Judgement:  Good  Insight:  Good  Psychomotor Activity:  Normal  Concentration:  Concentration: Good and Attention Span: Good  Recall:  Good  Fund of Knowledge: Good  Language: Good  Assets:  Desire for Improvement  ADL's:  Intact  Cognition: WNL  Prognosis:  Good    DIAGNOSES:    ICD-10-CM   1. Bipolar I disorder, most recent episode depressed (HCC)  F31.30   2. Attention deficit hyperactivity disorder (ADHD), predominantly inattentive type  F90.0   3. Generalized anxiety disorder  F41.1   4. Drug-induced nausea and vomiting  R11.2    T50.905A     Receiving Psychotherapy: Yes Stevphen Meuse, [facility].   RECOMMENDATIONS:  PDMP was reviewed. I provided 30 minutes of face-to-face time during this encounter.   Due to the severe nausea, I recommend that we stop the short acting lithium and changed to sustained release.  She would like to try it.  She did not increase the lithium as recommended between visits, because of the nausea.  I will only change the formulation of the lithium, and keep the dose the same. Her blood pressure is elevated today but she states it has been normal recently at her PCPs office, she is on an antihypertensive so no changes will be made.  I do recommend she keep an eye on it though. Change lithium to CR 450 mg, 2 p.o. twice daily. Increase Abilify to 5 mg p.o. every morning.. Continue Adderall 20 mg, 1 p.o. every morning. Continue Cymbalta 60 mg, 2 p.o. daily. Continue Vraylar 6 mg, 1 p.o. every morning. Check lithium level in approximately 7 to 10 days.  She has an order already.   Continue therapy with Stevphen Meuse, [facility] C. Return in 3 months.  Melony Overly, PA-C

## 2020-05-01 ENCOUNTER — Other Ambulatory Visit: Payer: Self-pay | Admitting: Physician Assistant

## 2020-05-02 ENCOUNTER — Other Ambulatory Visit: Payer: Self-pay | Admitting: Physician Assistant

## 2020-05-05 NOTE — Telephone Encounter (Signed)
Please review

## 2020-05-08 ENCOUNTER — Encounter: Payer: Self-pay | Admitting: Psychiatry

## 2020-05-09 ENCOUNTER — Other Ambulatory Visit: Payer: Self-pay

## 2020-05-09 ENCOUNTER — Telehealth: Payer: Self-pay | Admitting: Physician Assistant

## 2020-05-09 MED ORDER — AMPHETAMINE-DEXTROAMPHETAMINE 20 MG PO TABS: 20.0000 mg | ORAL_TABLET | Freq: Every day | ORAL | 0 refills | Status: DC

## 2020-05-09 NOTE — Telephone Encounter (Signed)
Tried reaching pharmacy twice put on hold for 8 minutes then kept ringing. Will try again later

## 2020-05-09 NOTE — Telephone Encounter (Signed)
Pt has apt 11/10 and requesting refill for Adderall.. Looks like she has one sent 8/11 for 11/1. Pharmacy said one on file expired. Please contact Pharmacy [facility] [address] Sidney Ace

## 2020-05-09 NOTE — Telephone Encounter (Signed)
After multiple calls able to speak with pharmacist, they reported having no Rx's on file after May   Pended only 1 Rx for now to fill  Last refill 03/30/2020

## 2020-05-09 NOTE — Telephone Encounter (Signed)
We are showing Rx for 09/02, 10/02, and 11/1 that are current and sent on 03/20/20. Will try to contact pharmacy for clarification

## 2020-05-12 ENCOUNTER — Other Ambulatory Visit: Payer: Self-pay | Admitting: Physician Assistant

## 2020-05-27 ENCOUNTER — Other Ambulatory Visit: Payer: Self-pay | Admitting: Physician Assistant

## 2020-05-28 ENCOUNTER — Other Ambulatory Visit: Payer: Self-pay

## 2020-05-28 ENCOUNTER — Encounter: Payer: Self-pay | Admitting: Physician Assistant

## 2020-05-28 ENCOUNTER — Ambulatory Visit (INDEPENDENT_AMBULATORY_CARE_PROVIDER_SITE_OTHER): Payer: BC Managed Care – PPO | Admitting: Physician Assistant

## 2020-05-28 VITALS — BP 143/104 | HR 89

## 2020-05-28 DIAGNOSIS — F5105 Insomnia due to other mental disorder: Secondary | ICD-10-CM

## 2020-05-28 DIAGNOSIS — Z79899 Other long term (current) drug therapy: Secondary | ICD-10-CM

## 2020-05-28 DIAGNOSIS — T50905A Adverse effect of unspecified drugs, medicaments and biological substances, initial encounter: Secondary | ICD-10-CM

## 2020-05-28 DIAGNOSIS — F3160 Bipolar disorder, current episode mixed, unspecified: Secondary | ICD-10-CM

## 2020-05-28 DIAGNOSIS — F411 Generalized anxiety disorder: Secondary | ICD-10-CM

## 2020-05-28 DIAGNOSIS — R112 Nausea with vomiting, unspecified: Secondary | ICD-10-CM | POA: Diagnosis not present

## 2020-05-28 DIAGNOSIS — F900 Attention-deficit hyperactivity disorder, predominantly inattentive type: Secondary | ICD-10-CM

## 2020-05-28 DIAGNOSIS — F99 Mental disorder, not otherwise specified: Secondary | ICD-10-CM

## 2020-05-28 LAB — LITHIUM LEVEL: Lithium Lvl: 1 mmol/L (ref 0.5–1.2)

## 2020-05-28 MED ORDER — LAMOTRIGINE 25 MG PO TABS: ORAL_TABLET | ORAL | 1 refills | Status: DC

## 2020-05-28 MED ORDER — AMPHETAMINE-DEXTROAMPHETAMINE 20 MG PO TABS: 20.0000 mg | ORAL_TABLET | Freq: Every day | ORAL | 0 refills | Status: DC

## 2020-05-28 MED ORDER — DIVALPROEX SODIUM ER 500 MG PO TB24: ORAL_TABLET | ORAL | 1 refills | Status: DC

## 2020-05-28 MED ORDER — ALPRAZOLAM 0.5 MG PO TABS: 0.5000 mg | ORAL_TABLET | Freq: Two times a day (BID) | ORAL | 0 refills | Status: DC | PRN

## 2020-05-28 MED ORDER — ARIPIPRAZOLE 10 MG PO TABS: 10.0000 mg | ORAL_TABLET | Freq: Every day | ORAL | 1 refills | Status: DC

## 2020-05-28 NOTE — Progress Notes (Signed)
[facility]  Patient ID: [name],  MRN: [id number]  PCP: [name]  Date of Evaluation: 05/28/2020 Time spent:40 minutes  Chief Complaint:  Chief Complaint    Anxiety; Depression; ADD; Insomnia; Follow-up     HISTORY/CURRENT STATUS: For routine med check. Husband [name] is with her.  [name] has had horrible nausea and vomiting on a couple of occasions with the lithium.  States she has stopped it off and on over the past 3 months since her last visit.  Most recently she stopped it about 3 weeks ago and then restarted it a few days ago.  She has had a huge setback since Renee.  [name] states she's having more manic periods. Has increased spending, doesn't sleep well, and gets impulsivie like reaching out by email to that guy she's had an affair with a few years ago.  Goes between extreme depression with no energy, decreased hygiene, has a hard time getting out of bed, but has not missed any work due to her mental health.  And she cries a lot.  Is very fatigued and does not have a lot of motivation when she feels that way.  She goes back and forth even in the same day where she has the manic symptoms noted above.  No risky behavior unless that is emailing the other man.  No grandiosity, but does state she does not care about any consequences when she is buying extra that she does not need or email in that guy.  Denies hallucinations.  Denies suicidal or homicidal thoughts.  She has only been getting 4 to 5 hours of sleep when she is manic.  She has been that way off and on for 3 weeks now.  It is not good rest either.  She has taken the Xanax a few times that helps her sleep.  She only takes 1/4-1/2 of the Xanax 0.25 mg and that helps her.  She has not felt that the Adderall has increased mania at any time.  She has been on that for a while.  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  She does not take it on weekends.  Review of Systems   Constitutional: Positive for malaise/fatigue.  HENT: Negative.   Eyes: Negative.   Respiratory: Negative.   Cardiovascular: Negative.   Gastrointestinal: Negative.   Genitourinary: Negative.   Musculoskeletal: Negative.   Skin: Negative.   Neurological: Negative.   Endo/Heme/Allergies: Negative.   Psychiatric/Behavioral:       See above     Individual Medical History/ Review of Systems: Changes? :No    Past medications for mental health diagnoses include: Zoloft, trazodone, Xanax, Prozac, Vraylar, Cymbalta, Rexulti, Wellbutrin caused anxiety  Allergies: Oxycodone, Sulfa antibiotics, and Tamiflu [oseltamivir phosphate]  Current Medications:  Current Outpatient Medications:  .  ALPRAZolam (XANAX) 0.5 MG tablet, Take 1 tablet (0.5 mg total) by mouth 2 (two) times daily as needed for anxiety., Disp: 30 tablet, Rfl: 0 .  amphetamine-dextroamphetamine (ADDERALL) 20 MG tablet, Take 1 tablet (20 mg total) by mouth daily., Disp: 30 tablet, Rfl: 0 .  amphetamine-dextroamphetamine (ADDERALL) 20 MG tablet, Take 1 tablet (20 mg total) by mouth daily., Disp: 30 tablet, Rfl: 0 .  amphetamine-dextroamphetamine (ADDERALL) 20 MG tablet, Take 1 tablet (20 mg total) by mouth daily., Disp: 30 tablet, Rfl: 0 .  Cariprazine HCl (VRAYLAR) 6 MG CAPS, TAKE 1 CAPSULE(6 MG) BY MOUTH DAILY, Disp: 90 capsule, Rfl: 1 .  DULoxetine (CYMBALTA) 60 MG capsule, TAKE 2 CAPSULES(120 MG) BY MOUTH DAILY, Disp: 180 capsule, Rfl: 1 .  esomeprazole (NEXIUM) 40 MG capsule, Take 40-80 mg by mouth daily as needed (for acid reflux). , Disp: , Rfl:  .  hydrochlorothiazide (HYDRODIURIL) 25 MG tablet, Take 25 mg by mouth daily., Disp: , Rfl:  .  lisinopril (PRINIVIL,ZESTRIL) 20 MG tablet, Take 20 mg by mouth every evening. , Disp: , Rfl:  .  metoprolol succinate (TOPROL-XL) 50 MG 24 hr tablet, Take 50 mg by mouth daily., Disp: , Rfl:  .  ARIPiprazole (ABILIFY) 10 MG tablet, Take 1 tablet (10 mg total) by mouth daily., Disp: 30  tablet, Rfl: 1 .  divalproex (DEPAKOTE ER) 500 MG 24 hr tablet, 1 po qhs for 4 nights, then increase to 2 po qhs., Disp: 60 tablet, Rfl: 1 .  lamoTRIgine (LAMICTAL) 25 MG tablet, 1 po every other night for 2 weeks, then 1 po qhs., Disp: 30 tablet, Rfl: 1 Medication Side Effects: nausea   Family Medical/ Social History: Changes?  See above  MENTAL HEALTH EXAM:  Blood pressure (!) 143/104, pulse 89.There is no height or weight on file to calculate BMI.  General Appearance: Casual, Neat, Well Groomed and Obese  Eye Contact:  Good  Speech:  Clear and Coherent and Normal Rate  Volume:  Normal  Mood:  Depressed  Affect:  Depressed and Tearful  Thought Process:  Goal Directed and Descriptions of Associations: Intact  Orientation:  Full (Time, Place, and Person)  Thought Content: Logical   Suicidal Thoughts:  No  Homicidal Thoughts:  No  Memory:  WNL  Judgement:  Good  Insight:  Good  Psychomotor Activity:  Normal  Concentration:  Concentration: Good and Attention Span: Good  Recall:  Good  Fund of Knowledge: Good  Language: Good  Assets:  Desire for Improvement  ADL's:  Intact  Cognition: WNL  Prognosis:  Good    DIAGNOSES:    ICD-10-CM   1. Bipolar 1 disorder, mixed (HCC)  F31.60   2. Encounter for long-term (current) use of medications  Z79.899 CBC with Differential/Platelet    Comprehensive metabolic panel    Valproic acid level  3. Drug-induced nausea and vomiting  R11.2    T50.905A   4. Insomnia due to other mental disorder  F51.05    F99   5. Generalized anxiety disorder  F41.1   6. Attention deficit hyperactivity disorder (ADHD), predominantly inattentive type  F90.0     Receiving Psychotherapy: Yes [name], [facility].   RECOMMENDATIONS:  PDMP was reviewed. I provided 40 minutes of face-to-face time during this encounter.   We discussed different options including changing lithium back to the instant release but that was not effective in the past so that is  not a reasonable option.  And it caused nausea then also. I would recommend starting Lamictal to help with the depression and Depakote to help with the mania.  I discussed the benefits risks and side effects. Counseled patient regarding potential benefits, risks, and side effects of Lamictal to include potential risk of Stevens-Johnson syndrome. Advised patient to stop taking Lamictal and contact office immediately if rash develops and to seek urgent medical attention if rash is severe and/or spreading quickly.  Patient understands and accepts these risks.  Also discussed possible thrombocytopenia, pancreatitis, abnormal liver function secondary to Depakote.  She and her husband know what to watch for and call if abdominal pain, yellowing of eyes or skin, increased bleeding or  rash occur. Discontinue the lithium.  She has not been on it long enough now for any withdrawals to be an issue. Continue Xanax 0.5 mg, 1/2-1 p.o. twice daily as needed or take only at night for sleep.  She had not been taking this often but needs it now.  I am not concerned about the concomitant use Adderall. Start Lamictal 25 mg, 1 p.o. every other night for 2 weeks and then 1 p.o. nightly. Start Depakote ER 500 mg, 1 p.o. nightly for 4 nights and then increase to 2 p.o. nightly. Increase Abilify to 10 mg p.o. every morning to help with the symptoms of melancholy depression. Continue Adderall 20 mg, 1 p.o. every morning.  Need to watch for mania.  This may be a contributing factor although I doubt it. Continue Cymbalta 60 mg, 2 p.o. daily.  We will continue this for now but may need to wean off at the next visit since it does not seem to be effective.  I just do not want to make too many changes at 1 time. Continue Vraylar 6 mg, 1 p.o. every morning.  Depending on how she does in the near future, we may be able to wean off of this and stay on Abilify only. She had lithium level drawn yesterday.  Will have above labs drawn in  approximately 7-10 days, after increasing the Depakote to 2 pills every evening. Restart therapy with [name], [facility]. Return in 4 to 6 weeks  Melony Overly, PA-C

## 2020-05-30 ENCOUNTER — Encounter: Payer: Self-pay | Admitting: Physician Assistant

## 2020-05-30 NOTE — Progress Notes (Signed)
No action needed.  Lithium level is in normal range and we are discontinuing it anyway.

## 2020-06-02 NOTE — Progress Notes (Signed)
I think this is a duplicate that of already addressed.  She has stopped the lithium so it is not necessary to call her and give her this value.  Thanks

## 2020-06-17 ENCOUNTER — Other Ambulatory Visit: Payer: Self-pay | Admitting: Physician Assistant

## 2020-06-17 DIAGNOSIS — Z79899 Other long term (current) drug therapy: Secondary | ICD-10-CM

## 2020-06-17 LAB — COMPREHENSIVE METABOLIC PANEL
ALT: 23 IU/L (ref 0–32)
AST: 18 IU/L (ref 0–40)
Albumin/Globulin Ratio: 1.8 (ref 1.2–2.2)
Albumin: 4.8 g/dL (ref 3.8–4.8)
Alkaline Phosphatase: 70 IU/L (ref 44–121)
BUN/Creatinine Ratio: 15 (ref 9–23)
BUN: 13 mg/dL (ref 6–24)
Bilirubin Total: 0.4 mg/dL (ref 0.0–1.2)
CO2: 24 mmol/L (ref 20–29)
Calcium: 10.2 mg/dL (ref 8.7–10.2)
Chloride: 95 mmol/L — ABNORMAL LOW (ref 96–106)
Creatinine, Ser: 0.89 mg/dL (ref 0.57–1.00)
GFR calc Af Amer: 90 mL/min/{1.73_m2} (ref >59)
GFR calc non Af Amer: 78 mL/min/{1.73_m2} (ref >59)
Globulin, Total: 2.7 g/dL (ref 1.5–4.5)
Glucose: 116 mg/dL — ABNORMAL HIGH (ref 65–99)
Potassium: 4 mmol/L (ref 3.5–5.2)
Sodium: 137 mmol/L (ref 134–144)
Total Protein: 7.5 g/dL (ref 6.0–8.5)

## 2020-06-17 LAB — VALPROIC ACID LEVEL: Valproic Acid Lvl: 34 ug/mL — ABNORMAL LOW (ref 50–100)

## 2020-06-17 LAB — CBC WITH DIFFERENTIAL/PLATELET
Basophils Absolute: 0.1 10*3/uL (ref 0.0–0.2)
Basos: 1 %
EOS (ABSOLUTE): 0.2 10*3/uL (ref 0.0–0.4)
Eos: 2 %
Hematocrit: 49.4 % — ABNORMAL HIGH (ref 34.0–46.6)
Hemoglobin: 16.5 g/dL — ABNORMAL HIGH (ref 11.1–15.9)
Immature Grans (Abs): 0 10*3/uL (ref 0.0–0.1)
Immature Granulocytes: 0 %
Lymphocytes Absolute: 2.7 10*3/uL (ref 0.7–3.1)
Lymphs: 30 %
MCH: 29.9 pg (ref 26.6–33.0)
MCHC: 33.4 g/dL (ref 31.5–35.7)
MCV: 90 fL (ref 79–97)
Monocytes Absolute: 1 10*3/uL — ABNORMAL HIGH (ref 0.1–0.9)
Monocytes: 11 %
Neutrophils Absolute: 5 10*3/uL (ref 1.4–7.0)
Neutrophils: 56 %
Platelets: 476 10*3/uL — ABNORMAL HIGH (ref 150–450)
RBC: 5.51 x10E6/uL — ABNORMAL HIGH (ref 3.77–5.28)
RDW: 12.5 % (ref 11.7–15.4)
WBC: 9 10*3/uL (ref 3.4–10.8)

## 2020-06-17 NOTE — Progress Notes (Signed)
Let her know the Depakote level is 34.  I would like for it to be between 50 and 100.  Let us increase the Depakote ER from 1000 mg up to 1500 mg.  So she will go from 2 pills to 3 pills.  And I would like for her to recheck the labs and 7 to 10 days.  I will order that and asked Delice Bison to mail the order to her Depakote can affect the platelets and the liver function test.  All of that was normal.  The Vraylar can affect the blood sugar which was 116.  If that was a random glucose then that is normal.  If it was fasting it is a little bit high and needs to be followed up probably in 3 months or so with her PCP.  It is not extremely high so do not worry at this point.Her hemoglobin and hematocrit are 16.5 and 49.4 which is a little too high.  I think she should follow-up with her PCP on that.  It is not dangerously high, but if it continues to stay that way or goes up, that means she has too many red blood cells.  If she can, she may want to donate blood.

## 2020-06-18 ENCOUNTER — Other Ambulatory Visit: Payer: Self-pay | Admitting: Physician Assistant

## 2020-06-18 ENCOUNTER — Telehealth: Payer: Self-pay | Admitting: Physician Assistant

## 2020-06-18 MED ORDER — QUETIAPINE FUMARATE ER 300 MG PO TB24: ORAL_TABLET | ORAL | 1 refills | Status: DC

## 2020-06-18 MED ORDER — DIVALPROEX SODIUM ER 500 MG PO TB24: 1500.0000 mg | ORAL_TABLET | Freq: Every day | ORAL | 1 refills | Status: DC

## 2020-06-18 NOTE — Telephone Encounter (Signed)
Rtc to [employer] and he reports patient is having a full blown manic episode, she's been this way for at least 7 days. She is back with the guy she's had an affair with the past 2 years. They are looking at apartments and plan to move in together. She hates her family, his family and her family. She won't listen to anyone else but this guy. The situation with this other guy is trying to take advantage of her, he lives with his parents, has no house, not anything. She will be putting everything in her name. [name] mentioned she didn't work Monday, but she's gone in the last 2 days, he's very concerned about her driving since her thoughts are so irrational. Afraid she will have an accident or she will cause one. No SI that he's aware of, no paranoia or hallucinations he reports. He doesn't know what to do, he doesn't think she will come back after this episode like she normally does. He reports she's laying around at home, she will call the other guy while her husband is home, he even asks her to get off the phone and she won't.   All information discussed with Rosey Bath and she wants patient to increase Depakote as advised in lab report, add Seroquel on for sleep and help slow her down. She also recommends patient stay out of work for a week, no driving and come to apt on Friday, at 10:30 am.   Will contact patient with above information.

## 2020-06-18 NOTE — Telephone Encounter (Signed)
Patient called back and we discussed her labs and advised her to increase her Depakote. She agreed. Discussed her mood and she reports it's "not good" and she is glad there will be changes. Instructed her on adding Seroquel, she said she has not been sleeping much at all and doesn't like taking the Xanax because the next day she has a hangover effect. Advised her to give the Seroquel a chance and she agreed, explained it would help slow down her thoughts and help her sleep. Discussed her Adderall and asked how her blood pressure was running, she reports it's doing better then before, but patient's husband reports it's elevated. Advised her to not take Adderall right now until medication and moods were better adjusted. She said she doesn't take it daily, she's only been taking occasionally. Informed her it could exacerbate her condition and worsen her b/p. She does mention that her and her husband are separating and not together anymore. She was aware he called, she mentioned the apt on Friday at 10:30 am but states that is the worst time for her due to work. 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 Informed her I did understand her concerns but her health is very important and she needs to do what is best for her. She said she would think about taking off, I did suggest taking off at least rest of week then she would have weekend. She does what to come for apt but she said it has to be early in the morning or latest in the day, informed her I would discuss with Rosey Bath and see what she has available. I did not bring up her not driving right now because with her still continuing to work that wouldn't be a possible solution. Will inform Rosey Bath. Patient mentioned that her husband still did call up her concerned and she was aware of  that, her only comment was that's another story. She did not mention anything about an affair or moving out during conversation. She does realize her medication and mood is off and she needs help.

## 2020-06-18 NOTE — Telephone Encounter (Signed)
Left [name] a message that we were trying to reach Wing about her lab results as well. Depakote level is low.   Rosey Bath, is there anything else you want patient to do?

## 2020-06-18 NOTE — Telephone Encounter (Signed)
No action needed. I spoke with her husband, [name].  He's very worried about her. Tells me many of the same things he is already discussed with Traci as noted above.  Patient is not suicidal, has no paranoia or hallucinations.  He is concerned that she will not stay out of work and ask if I sent in paperwork for her to stay out.  He was told that she will first of all need to be willing to stay out of work and then she needs to get the [facility] form sent to [place].  I tried to reassure him that the treatment of high-dose Seroquel and rapid increased dose is something that is used as an inpatient for acute manic episode like this so hopefully she will respond really quickly.  I would recommend hospitalization but she really does not meet the criteria and she is not willing to go.  We would have to IVC her which I think would do more harm than good right now.  If her symptoms change, he will call us, 911, or take her to the ER or behavioral health urgent care if she is willing to go.

## 2020-06-18 NOTE — Telephone Encounter (Signed)
Husband called and said that [name] is in a maniac episode and he would like to talk to [name] or the nurse about what to do. Please give [name] a call at [phone].

## 2020-06-26 ENCOUNTER — Other Ambulatory Visit: Payer: Self-pay

## 2020-06-26 ENCOUNTER — Ambulatory Visit: Payer: BC Managed Care – PPO | Admitting: Psychiatry

## 2020-06-26 DIAGNOSIS — F3160 Bipolar disorder, current episode mixed, unspecified: Secondary | ICD-10-CM | POA: Diagnosis not present

## 2020-06-26 NOTE — Progress Notes (Signed)
[facility] Counselor/Therapist Progress Note  Patient ID: CECILE GILLISPIE, MRN: [id number],    Date: 06/29/2020  Time Spent: 50 minutes start time 5:06 PM end time 5:56 PM  Treatment Type: Individual Therapy  Reported Symptoms: anxiety, sadness, sleep issues, impulsive behaviors, racing thoughts,fear, hurt    Mental Status Exam:  Appearance:   Well Groomed     Behavior:  Appropriate  Motor:  Normal  Speech/Language:   Normal Rate  Affect:  Appropriate  Mood:  normal  Thought process:  Racing thoughts  Thought content:    WNL  Sensory/Perceptual disturbances:    WNL  Orientation:  oriented to person, place, time/date and situation  Attention:  Good  Concentration:  Good  Memory:  WNL  Fund of knowledge:   Good  Insight:    Good  Judgment:   Good  Impulse Control:  Good   Risk Assessment: Danger to Self:  No Self-injurious Behavior: No Danger to Others: No Duty to Warn:no Physical Aggression / Violence:No  Access to Firearms a concern: No  Gang Involvement:No   Subjective: Patient was present for session.  She shared that she and her husband broke up over Thanksgiving.  Patient shared that when she was in a low she would email someone she shouldn't or do something wrong.  He would tell everyone what happen and than he started telling people at [facility].  Her parents have disowned her.  Mother shared she has known that she hasn't been happy for several years but she won't talk to her. She is moving into an apartment on her own. She shared it has been hard through the transition time.  She shared that she changed jobs in the fall. She has lots of sadness over the situation and she is worried about her relationship with her son.  She shared that she doesn't have any support currently.  She shared that he has told her things that are hurtful.  Patient went on to share she does not have any when she can talk to about the situation.  Patient was encouraged to try and take things  1 step at a time.  She had reported that triggered better recent medication changes she was encouraged to allow that to take effect before she made a permanent decision.  She was encouraged not to sign anything with her husband at this time since she is not exactly sure what she is going to do.  Made plan for her to get what she needed to out of her home so that she can function.  Patient was encouraged to try and focus on her self-care including exercising and journaling.  Patient was also encouraged to start coming back in for regular sessions since she is not been to treatment for many months.  Updated treatment plan in session and set new goals.  Could not sign due to COVID and need for social distancing  Interventions: Solution-Oriented/Positive Psychology  Diagnosis:   ICD-10-CM   1. Bipolar 1 disorder, mixed (HCC)  F31.60     Plan: Patient is to use CBT and coping skills to decrease symptoms.  Patient is to work on exercise and journaling to release negative emotions appropriately.  Patient is to take medication as directed. Long-term goal: Resolve the core conflict that is the source of anxiety Short-term goal: Identify the major life complex in the past and present that form the basis for present anxiety  Stevphen Meuse, [facility]

## 2020-06-27 ENCOUNTER — Other Ambulatory Visit: Payer: Self-pay | Admitting: Physician Assistant

## 2020-07-07 ENCOUNTER — Ambulatory Visit: Payer: BC Managed Care – PPO | Admitting: Psychiatry

## 2020-07-07 ENCOUNTER — Other Ambulatory Visit: Payer: Self-pay

## 2020-07-07 DIAGNOSIS — F319 Bipolar disorder, unspecified: Secondary | ICD-10-CM

## 2020-07-07 NOTE — Progress Notes (Signed)
°      [facility] Counselor/Therapist Progress Note  Patient ID: [name], MRN: [id number],    Date: 07/07/2020  Time Spent: 45 minutes start time 4:06 PM and time 4:51 PM  Treatment Type: Individual Therapy  Reported Symptoms: mood swings, anxiety, sadness, crying spells, focusing issues, memory issues  Mental Status Exam:  Appearance:   Casual and Neat     Behavior:  Appropriate  Motor:  Normal  Speech/Language:   Normal Rate  Affect:  Appropriate  Mood:  anxious and sad  Thought process:  normal  Thought content:    WNL  Sensory/Perceptual disturbances:    WNL  Orientation:  oriented to person, place, time/date and situation  Attention:  Good  Concentration:  Good  Memory:  WNL  Fund of knowledge:   Good  Insight:    Good  Judgment:   Good  Impulse Control:  Good   Risk Assessment: Danger to Self:  No Self-injurious Behavior: No Danger to Others: No Duty to Warn:no Physical Aggression / Violence:No  Access to Firearms a concern: No  Gang Involvement:No   Subjective: Patient was present for session.  She shared that her husband wants her to reconcile and she is not sure what to do. Encouraged patient to not make any decisions currently since she is going through med changes. Patient shared she is not sure what is going to happen over the Christmas break.  Patient was encouraged to take things 1 step at a time and to not look too far to the future at this point.  Tried to get patient to think through the past year and what triggered things to go in a negative direction.  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  She shared that is when she started going into mood swings.  Did EMDR set on losing her job, suds level 7, negative cognition "I am not enough" felt fear and anxiety in her stomach.  Patient was able to resolve the set and to recognize that things worked out and she is in a much better position with her new job.   Patient was able to acknowledge that she lost the job due to funding not because of her abilities.  Patient was encouraged to journal and to recognize what happens prior to mood changes and to write them things down to be worked on in future sessions.  Interventions: Solution-Oriented/Positive Psychology and Eye Movement Desensitization and Reprocessing (EMDR)  Diagnosis:   ICD-10-CM   1. Bipolar I disorder (HCC)  F31.9     Plan: Patient is to use CBT and coping skills to decrease depression symptoms.  Patient is to journal what happens prior to mood swings to discuss in session.  Patient is to work on exercising regularly to release negative emotions appropriately.  Patient is to let her husband know that she is not at a place to make a decision about their marriage until her medication levels are stable. Long-term goal: Develop the ability to recognize accept and cope with feelings of depression Short-term goal: Utilize behavioral strategies to overcome depression.  Identify and replace depressive thinking that leads to depressive feelings and actions  Stevphen Meuse, [facility]

## 2020-07-09 ENCOUNTER — Telehealth: Payer: Self-pay | Admitting: Physician Assistant

## 2020-07-09 NOTE — Telephone Encounter (Signed)
Alexandrina's husband, [name] called and wants to update you on [employer] before she comes in on 12/28. His number is [phone].

## 2020-07-09 NOTE — Telephone Encounter (Signed)
Patient has verbally requested that [facility] or I talk with him.  I am not going to call him back.

## 2020-07-15 ENCOUNTER — Other Ambulatory Visit: Payer: Self-pay

## 2020-07-15 ENCOUNTER — Encounter: Payer: Self-pay | Admitting: Physician Assistant

## 2020-07-15 ENCOUNTER — Ambulatory Visit (INDEPENDENT_AMBULATORY_CARE_PROVIDER_SITE_OTHER): Payer: BC Managed Care – PPO | Admitting: Physician Assistant

## 2020-07-15 ENCOUNTER — Telehealth: Payer: Self-pay | Admitting: Physician Assistant

## 2020-07-15 DIAGNOSIS — F99 Mental disorder, not otherwise specified: Secondary | ICD-10-CM

## 2020-07-15 DIAGNOSIS — F3130 Bipolar disorder, current episode depressed, mild or moderate severity, unspecified: Secondary | ICD-10-CM | POA: Diagnosis not present

## 2020-07-15 DIAGNOSIS — F900 Attention-deficit hyperactivity disorder, predominantly inattentive type: Secondary | ICD-10-CM

## 2020-07-15 DIAGNOSIS — F5105 Insomnia due to other mental disorder: Secondary | ICD-10-CM

## 2020-07-15 DIAGNOSIS — F411 Generalized anxiety disorder: Secondary | ICD-10-CM | POA: Diagnosis not present

## 2020-07-15 MED ORDER — ARIPIPRAZOLE 2 MG PO TABS: ORAL_TABLET | ORAL | 0 refills | Status: DC

## 2020-07-15 MED ORDER — LAMOTRIGINE 25 MG PO TABS: ORAL_TABLET | ORAL | 1 refills | Status: DC

## 2020-07-15 NOTE — Progress Notes (Signed)
[facility]  Patient ID: [name],  MRN: [id number]  PCP: [name]  Date of Evaluation: 07/15/2020 Time spent:40 minutes  Chief Complaint:  Chief Complaint    Depression; Follow-up      HISTORY/CURRENT STATUS: HPI For 6 week med check.   Still has some depression.  Feels like if we could tweak her medicines just a little bit she would feel better.  She is able to work but feels extremely tired since being on the Seroquel this past month.  She has been able to tolerate only 300 mg.  That has helped with the manic symptoms, but not depression.  She has a hard time enjoying things, energy and motivation are low but again that has not affected her ability to work.  She is not isolating.  She does cry easier than she had been.  No suicidal or homicidal thoughts.  Anxiety is still a problem.  Xanax does help when needed.  Not really having panic attacks but more of a generalized sense of anxiety.  She is not taking the Adderall regularly at all.  Feels that it does make her a little more anxious but there are days when she just has to have it.  Feels better as far as the mania goes.  She is not having increased energy with decreased need for sleep.  Denies increased talkativeness, no racing thoughts, no impulsivity or risky behaviors (she has moved out from her home, she and her husband are separated, but she does not feel like that has been an impulsive decision.  It has been a long time coming.)  No increased spending, no increased libido, no grandiosity, no increased irritability or anger, and no hallucinations.  Denies dizziness, syncope, seizures, numbness, tingling, tremor, tics, unsteady gait, slurred speech, confusion. Denies muscle or joint pain, stiffness, or dystonia. Denies unexplained weight loss, frequent infections, or sores that heal slowly.  No polyphagia, polydipsia, or polyuria. Denies visual changes or paresthesias.   Individual Medical  History/ Review of Systems: Changes? :No    Past medications for mental health diagnoses include: Zoloft, trazodone, Xanax, Prozac, Vraylar, Cymbalta, Rexulti, Wellbutrin caused anxiety, lithium caused severe nausea  Allergies: Oxycodone, Sulfa antibiotics, and Tamiflu [oseltamivir phosphate]  Current Medications:  Current Outpatient Medications:    ALPRAZolam (XANAX) 0.5 MG tablet, Take 1 tablet (0.5 mg total) by mouth 2 (two) times daily as needed for anxiety., Disp: 30 tablet, Rfl: 0   amphetamine-dextroamphetamine (ADDERALL) 20 MG tablet, Take 1 tablet (20 mg total) by mouth daily., Disp: 30 tablet, Rfl: 0   amphetamine-dextroamphetamine (ADDERALL) 20 MG tablet, Take 1 tablet (20 mg total) by mouth daily., Disp: 30 tablet, Rfl: 0   amphetamine-dextroamphetamine (ADDERALL) 20 MG tablet, Take 1 tablet (20 mg total) by mouth daily., Disp: 30 tablet, Rfl: 0   ARIPiprazole (ABILIFY) 2 MG tablet, Take 4 p.o. every morning for 2 days, then 3 p.o. every morning for 2 days, 2 p.o. every morning for 2 days then 1 p.o. daily for 2 days and then stop., Disp: 38 tablet, Rfl: 0   Cariprazine HCl (VRAYLAR) 6 MG CAPS, TAKE 1 CAPSULE(6 MG) BY MOUTH DAILY, Disp: 90 capsule, Rfl: 1   divalproex (DEPAKOTE ER) 500 MG 24 hr tablet, Take 3 tablets (1,500 mg total) by mouth at bedtime., Disp: 90 tablet, Rfl: 1   DULoxetine (CYMBALTA) 60 MG capsule, TAKE 2 CAPSULE BY MOUTH EVERY DAY, Disp: 180 capsule, Rfl: 1   esomeprazole (NEXIUM) 40 MG capsule, Take  40-80 mg by mouth daily as needed (for acid reflux). , Disp: , Rfl:    hydrochlorothiazide (HYDRODIURIL) 25 MG tablet, Take 25 mg by mouth daily., Disp: , Rfl:    lisinopril (PRINIVIL,ZESTRIL) 20 MG tablet, Take 20 mg by mouth every evening. , Disp: , Rfl:    metoprolol succinate (TOPROL-XL) 50 MG 24 hr tablet, Take 50 mg by mouth daily., Disp: , Rfl:    QUEtiapine (SEROQUEL XR) 300 MG 24 hr tablet, 1 po at hs for 1 night, then increase to 2 p.o. nightly.  (Patient taking differently: 300 mg. 1 po at hs for 1 night, then increase to 2 p.o. nightly.), Disp: 60 tablet, Rfl: 1   lamoTRIgine (LAMICTAL) 25 MG tablet, 2 po qhs for 2 weeks, then 3 po qhs until next visit., Disp: 90 tablet, Rfl: 1 Medication Side Effects: none  Family Medical/ Social History: Changes? Yes she and husband are separated.   MENTAL HEALTH EXAM:   There were no vitals taken for this visit.There is no height or weight on file to calculate BMI.  General Appearance: Casual, Neat, Well Groomed and Obese  Eye Contact:  Good  Speech:  Clear and Coherent and Normal Rate  Volume:  Normal  Mood:  Depressed  Affect:  Congruent  Thought Process:  Goal Directed and Descriptions of Associations: Intact  Orientation:  Full (Time, Place, and Person)  Thought Content: Logical   Suicidal Thoughts:  No  Homicidal Thoughts:  No  Memory:  WNL  Judgement:  Good  Insight:  Good  Psychomotor Activity:  Normal  Concentration:  Concentration: Good and Attention Span: Good  Recall:  Good  Fund of Knowledge: Good  Language: Good  Assets:  Desire for Improvement  ADL's:  Intact  Cognition: WNL  Prognosis:  Good   Most recent pertinent labs 06/16/2020 CBC platelet count was 476 Glucose 116, LFTs were normal Depakote level 34.  DIAGNOSES:    ICD-10-CM   1. Bipolar I disorder, most recent episode depressed (HCC)  F31.30   2. Generalized anxiety disorder  F41.1   3. Insomnia due to other mental disorder  F51.05    F99   4. Attention deficit hyperactivity disorder (ADHD), predominantly inattentive type  F90.0     Receiving Psychotherapy: Yes Stevphen Meuse, [facility].   RECOMMENDATIONS:  PDMP reviewed.  I provided 40 minutes of face-to-face time during this encounter, in which we discussed treatment options for the bipolar depression.  I realize she is on 3 different antipsychotics and want to get her off at least 1 and hopefully 2 of them in the future.  At the times they were  added, like Abilify for example, she needed an increase in mood.  No longer needs that.  Seroquel was added in the past month to help with sleep as well as mania and depression.  Leafy Kindle has been a constant throughout her treatment.  Our hope is to get her off Abilify and Seroquel and have Vraylar as the only antipsychotic.  She verbalizes understanding. To help with the depression now, I will increase Lamictal.  And we will wean off Abilify at the same time.  I do not want to do too many changes at once, but we will also reconsider the need for Cymbalta. Irving has requested that I not talk with her husband.  She will fill out a new HIPAA form stating we are unable to discuss anything with him. Increase Lamictal 25 mg to 2 pills p.o. nightly for 2  weeks and then 3 p.o. nightly until the next visit. Continue Depakote ER 500 mg, 3 p.o. nightly. Continue Seroquel XR 300 mg, 1 p.o. nightly. Continue Vraylar 6 mg, 1 p.o. daily. Wean off Abilify 2 mg, by taking 4 p.o. daily for 2 days, 3 p.o. daily for 2 days, 2 p.o. daily for 2 days, 1 p.o. daily for 2 days and then stop. Continue Xanax 0.5 mg, 1 p.o. twice daily as needed. Continue Adderall 20 mg, 1 p.o. daily as needed.  Do not mix with Xanax. Continue Cymbalta 60 mg, 2 p.o. daily. Continue therapy with Stevphen Meuse, [facility]. Get Depakote level drawn this week.  She already has the order. Return in 4 weeks.  Melony Overly, PA-C

## 2020-07-15 NOTE — Telephone Encounter (Signed)
[name] had an appointment with me earlier today.  She asked that I had not talked with her husband Public house manager.  At checkout, she was planning to have the front office make sure he is not on her HIPAA release.  I cannot discuss anything with him therefore I am not returning the phone call.

## 2020-07-15 NOTE — Telephone Encounter (Signed)
[name], [name]'s husband called to let you know that she has a visit with you today. She has left their marriage and has moved in with a guy she has been seeing for 3 years. [name] said her medicine is not right and she is refusing help. He said she needs to be seen at the hospital but will not go. He is requesting you call her at [phone].

## 2020-08-01 ENCOUNTER — Telehealth: Payer: Self-pay | Admitting: Physician Assistant

## 2020-08-01 ENCOUNTER — Other Ambulatory Visit: Payer: Self-pay | Admitting: Physician Assistant

## 2020-08-01 MED ORDER — ALPRAZOLAM 0.5 MG PO TABS: 0.5000 mg | ORAL_TABLET | Freq: Two times a day (BID) | ORAL | 0 refills | Status: DC | PRN

## 2020-08-01 NOTE — Telephone Encounter (Signed)
Pt called and asked for a refill of her xanax to be sent to the [facility] in [place]

## 2020-08-01 NOTE — Telephone Encounter (Signed)
Prescription was sent

## 2020-08-05 ENCOUNTER — Ambulatory Visit: Payer: BC Managed Care – PPO | Admitting: Psychiatry

## 2020-08-12 ENCOUNTER — Encounter: Payer: Self-pay | Admitting: Physician Assistant

## 2020-08-12 ENCOUNTER — Telehealth (INDEPENDENT_AMBULATORY_CARE_PROVIDER_SITE_OTHER): Payer: BC Managed Care – PPO | Admitting: Physician Assistant

## 2020-08-12 DIAGNOSIS — F5105 Insomnia due to other mental disorder: Secondary | ICD-10-CM

## 2020-08-12 DIAGNOSIS — F3130 Bipolar disorder, current episode depressed, mild or moderate severity, unspecified: Secondary | ICD-10-CM

## 2020-08-12 DIAGNOSIS — F411 Generalized anxiety disorder: Secondary | ICD-10-CM | POA: Diagnosis not present

## 2020-08-12 DIAGNOSIS — F99 Mental disorder, not otherwise specified: Secondary | ICD-10-CM

## 2020-08-12 DIAGNOSIS — F900 Attention-deficit hyperactivity disorder, predominantly inattentive type: Secondary | ICD-10-CM | POA: Diagnosis not present

## 2020-08-12 MED ORDER — SERTRALINE HCL 100 MG PO TABS: ORAL_TABLET | ORAL | 1 refills | Status: DC

## 2020-08-12 MED ORDER — LAMOTRIGINE 100 MG PO TABS: ORAL_TABLET | ORAL | 1 refills | Status: DC

## 2020-08-12 MED ORDER — AMPHETAMINE-DEXTROAMPHETAMINE 20 MG PO TABS: 20.0000 mg | ORAL_TABLET | Freq: Every day | ORAL | 0 refills | Status: DC

## 2020-08-12 NOTE — Progress Notes (Signed)
[facility]  Patient ID: YAZMINE SOREY,  MRN: [id number]  PCP: [name]  Date of Evaluation: 08/12/2020 Time spent:40 minutes  Chief Complaint:  Chief Complaint    Anxiety; Depression; Insomnia     Virtual Visit via Telehealth  I connected with patient by telephone, with their informed consent, and verified patient privacy and that I am speaking with the correct person using two identifiers.  I am private, in my office and the patient is at home.  I discussed the limitations, risks, security and privacy concerns of performing an evaluation and management service by telephone and the availability of in person appointments. I also discussed with the patient that there may be a patient responsible charge related to this service. The patient expressed understanding and agreed to proceed.   I discussed the assessment and treatment plan with the patient. The patient was provided an opportunity to ask questions and all were answered. The patient agreed with the plan and demonstrated an understanding of the instructions.   The patient was advised to call back or seek an in-person evaluation if the symptoms worsen or if the condition fails to improve as anticipated.  I provided 40  minutes of non-face-to-face time during this encounter.  HISTORY/CURRENT STATUS: HPI For 6 week med check.   Still very depressed.  Feels like the meds are not working as well as they did.  She and her husband are getting back together which she is happy about.  No other social changes.  Work continues to go well but she does miss days occasionally because of the depression. She has started weaning herself off Cymbalta because it was not working.  Anxiety is pretty controlled with Xanax.  She has more of a generalized sense of anxiety, rather than panic attacks.  She does not take the Adderall routinely.  Feels like that does sometimes make the anxiety worse.  Feels better as far as the  mania goes.  She is not having increased energy with decreased need for sleep.  Denies increased talkativeness, no racing thoughts, no impulsivity or risky behaviors.  No increased spending, no increased libido, no grandiosity, no increased irritability or anger, no paranoia, and no hallucinations.  Denies dizziness, syncope, seizures, numbness, tingling, tremor, tics, unsteady gait, slurred speech, confusion. Denies muscle or joint pain, stiffness, or dystonia. Denies unexplained weight loss, frequent infections, or sores that heal slowly.  No polyphagia, polydipsia, or polyuria. Denies visual changes or paresthesias.   Individual Medical History/ Review of Systems: Changes? :No    Past medications for mental health diagnoses include: Zoloft, trazodone, Xanax, Prozac, Vraylar, Cymbalta, Rexulti, Wellbutrin caused anxiety, lithium caused severe nausea  Allergies: Oxycodone, Sulfa antibiotics, and Tamiflu [oseltamivir phosphate]  Current Medications:  Current Outpatient Medications:  .  ALPRAZolam (XANAX) 0.5 MG tablet, Take 1 tablet (0.5 mg total) by mouth 2 (two) times daily as needed for anxiety., Disp: 30 tablet, Rfl: 0 .  amphetamine-dextroamphetamine (ADDERALL) 20 MG tablet, Take 1 tablet (20 mg total) by mouth daily., Disp: 30 tablet, Rfl: 0 .  amphetamine-dextroamphetamine (ADDERALL) 20 MG tablet, Take 1 tablet (20 mg total) by mouth daily., Disp: 30 tablet, Rfl: 0 .  Cariprazine HCl (VRAYLAR) 6 MG CAPS, TAKE 1 CAPSULE(6 MG) BY MOUTH DAILY, Disp: 90 capsule, Rfl: 1 .  esomeprazole (NEXIUM) 40 MG capsule, Take 40-80 mg by mouth daily as needed (for acid reflux). , Disp: , Rfl:  .  hydrochlorothiazide (HYDRODIURIL) 25 MG tablet, Take 25 mg by  mouth daily., Disp: , Rfl:  .  lamoTRIgine (LAMICTAL) 100 MG tablet, 1 po qd for 2 weeks, then increase to 1.5 po qd, Disp: 45 tablet, Rfl: 1 .  lisinopril (PRINIVIL,ZESTRIL) 20 MG tablet, Take 20 mg by mouth every evening. , Disp: , Rfl:  .   metoprolol succinate (TOPROL-XL) 50 MG 24 hr tablet, Take 50 mg by mouth daily., Disp: , Rfl:  .  sertraline (ZOLOFT) 100 MG tablet, 1/2 po daily for 1 week, then 1 po q d., Disp: 30 tablet, Rfl: 1 .  amphetamine-dextroamphetamine (ADDERALL) 20 MG tablet, Take 1 tablet (20 mg total) by mouth daily., Disp: 30 tablet, Rfl: 0 .  divalproex (DEPAKOTE ER) 500 MG 24 hr tablet, TAKE 3 TABLETS(1500 MG) BY MOUTH AT BEDTIME, Disp: 90 tablet, Rfl: 1 Medication Side Effects: none  Family Medical/ Social History: Changes? See HPI  MENTAL HEALTH EXAM:   There were no vitals taken for this visit.There is no height or weight on file to calculate BMI.  General Appearance: Unable to assess  Eye Contact:  Unable to assess  Speech:  Clear and Coherent and Normal Rate  Volume:  Normal  Mood:  Depressed  Affect:  unable to assess  Thought Process:  Goal Directed and Descriptions of Associations: Intact  Orientation:  Full (Time, Place, and Person)  Thought Content: Logical   Suicidal Thoughts:  No  Homicidal Thoughts:  No  Memory:  WNL  Judgement:  Good  Insight:  Good  Psychomotor Activity:  unable to assess  Concentration:  Concentration: Good and Attention Span: Good  Recall:  Good  Fund of Knowledge: Good  Language: Good  Assets:  Desire for Improvement  ADL's:  Intact  Cognition: WNL  Prognosis:  Good   Most recent pertinent labs 06/16/2020 CBC platelet count was 476 Glucose 116, LFTs were normal Depakote level 34.  DIAGNOSES:    ICD-10-CM   1. Bipolar I disorder, most recent episode depressed (HCC)  F31.30   2. Generalized anxiety disorder  F41.1   3. Insomnia due to other mental disorder  F51.05    F99   4. Attention deficit hyperactivity disorder (ADHD), predominantly inattentive type  F90.0     Receiving Psychotherapy: Yes Stevphen Meuse, [facility].   RECOMMENDATIONS:  PDMP reviewed.  I provided 40 minutes of nonface-to-face time during this encounter, in which we discussed  treatment options for the bipolar depression.  She has heard of Zoloft and especially its benefits for obsessive thoughts and wonders if we could try that.  We discussed the benefits and risks, side effects and she would like to try it still.  She understands that it may cause a manic episode to come on and she will let me know if that happens.  I also recommend increasing the Lamictal.  She is on a pretty low dose.  She would like to try that.  She will continue to wean off of the Cymbalta by taking 60 mg daily for 1 week and then 30 mg daily for 1 week and then stop.  If she has any withdrawal effects, call and we will go more slowly. Continue Xanax 0.5 mg, 1 p.o. twice daily as needed.  Use sparingly. Continue Adderall 20 mg, 1 p.o. daily.  Try to avoid this on days she needs Xanax more.  She is taking the Adderall as needed anyway so that should not be a problem. Continue Vraylar 6 mg, 1 p.o. daily.   Continue Depakote ER 500 mg, 3  p.o. nightly. Increase Lamictal to 100 mg daily for 2 weeks and in crease to 1.5 pills daily. Discontinue Seroquel XR.  Wean off as directed. Start Zoloft 100 mg, one half p.o. daily for 1 week and then 1 p.o. daily. Continue therapy with Stevphen Meuse, [facility]. Return in 4-6 weeks.  Melony Overly, PA-C

## 2020-08-13 ENCOUNTER — Other Ambulatory Visit: Payer: Self-pay | Admitting: Physician Assistant

## 2020-08-19 ENCOUNTER — Telehealth: Payer: Self-pay | Admitting: Physician Assistant

## 2020-08-19 NOTE — Telephone Encounter (Signed)
Please triage

## 2020-08-19 NOTE — Telephone Encounter (Signed)
Pt called to report need change with meds Follow up 3/29. First available and on canc list. Contact # [phone].

## 2020-08-20 NOTE — Telephone Encounter (Signed)
Left pt a msg to call back

## 2020-08-25 ENCOUNTER — Telehealth: Payer: Self-pay | Admitting: Physician Assistant

## 2020-08-25 NOTE — Telephone Encounter (Signed)
Patient's last apt 01/25, started Zoloft and now taking 100 mg daily, she reports not feeling any better at all and requesting for a change or adjustment. Informed her [name] out of office this afternoon but I would discuss with her in the morning.

## 2020-08-25 NOTE — Telephone Encounter (Signed)
Pt called back and said that the zoloft is not working and she doesn't see teresa until the end of march. That is what she wants to talk to the nurse about

## 2020-08-26 ENCOUNTER — Ambulatory Visit (INDEPENDENT_AMBULATORY_CARE_PROVIDER_SITE_OTHER): Payer: BC Managed Care – PPO | Admitting: Psychiatry

## 2020-08-26 ENCOUNTER — Other Ambulatory Visit: Payer: Self-pay

## 2020-08-26 DIAGNOSIS — F319 Bipolar disorder, unspecified: Secondary | ICD-10-CM | POA: Diagnosis not present

## 2020-08-26 NOTE — Telephone Encounter (Signed)
Patient is aware to give medication more time, discussed how hard it is to wait but to hang in there. She agreed and she does have apt with [facility] this afternoon and hopefully that will help some.

## 2020-08-26 NOTE — Progress Notes (Signed)
      [facility] Counselor/Therapist Progress Note  Patient ID: [name], MRN: [id number],    Date: 08/26/2020  Time Spent: 49 minutes start time 5:10 PM end time 5:59 PM  Treatment Type: Individual Therapy  Reported Symptoms: depression, regret, sleep issues-too much, crying spells, rumination, low motivation, fatigue  Mental Status Exam:  Appearance:   Casual and Neat     Behavior:  Appropriate  Motor:  Normal  Speech/Language:   Normal Rate  Affect:  Appropriate  Mood:  sad  Thought process:  normal  Thought content:    WNL  Sensory/Perceptual disturbances:    WNL  Orientation:  oriented to person, place, time/date and situation  Attention:  Good  Concentration:  Good  Memory:  WNL  Fund of knowledge:   Good  Insight:    Good  Judgment:   Good  Impulse Control:  Good   Risk Assessment: Danger to Self:  No Self-injurious Behavior: No Danger to Others: No Duty to Warn:no Physical Aggression / Violence:No  Access to Firearms a concern: No  Gang Involvement:No   Subjective: Patient was present for session.  She shared that her depression has increased.  She went on to share that she has reconciled with her husband and she is happy about it. She expressed shame and guilt about the whole situation.  Patient reported she is having difficulty being around their friends and going to church due to knowing that her husband had shared all of the things that she did with other people.  Did EMDR set on people whispering, suds level 9, negative cognition "I make bad choices" felt shame in her chest.  Patient was able to reduce suds level to 3.  She was able to recognize the fact that everybody makes bad choices and she does not have to keep focusing on her bad choices.  Patient was encouraged to talk with her husband about getting out of town regularly so that she can relax and feel better since her in-laws live next door to them and know the whole situation that can be very  triggering for her.  Patient was also reminded that she seems to make better choices when she is exercising regularly.  She agreed to try and find someone at the school that she could walk with before she goes home in the evenings to try and release emotions appropriately.  Interventions: Solution-Oriented/Positive Psychology and Eye Movement Desensitization and Reprocessing (EMDR)  Diagnosis:   ICD-10-CM   1. Bipolar I disorder (HCC)  F31.9     Plan: Is to use CBT and coping skills to decrease depression and anxiety symptoms.  Patient is to work on exercising regularly to release emotions appropriately.  Patient is to talk with husband about getting out of town regularly to help her relax.  Patient is to remind herself of the good choices she does make. Long-term goal: Develop the ability to recognize accept and cope with feelings of depression Short-term goal: Utilize behavioral strategies to overcome depression.  Identify and replace depressive thinking that leads to depressive feelings and actions  Stevphen Meuse, [facility]

## 2020-08-26 NOTE — Telephone Encounter (Signed)
Please remind her it's going to take at least 4 weeks before she feels a lot better, this is normal. Some people do feel better within a few weeks though, so try not to get discouraged. If having SI of course go to [facility].

## 2020-09-15 ENCOUNTER — Other Ambulatory Visit: Payer: Self-pay | Admitting: Physician Assistant

## 2020-09-15 ENCOUNTER — Telehealth: Payer: Self-pay | Admitting: Physician Assistant

## 2020-09-15 MED ORDER — SERTRALINE HCL 100 MG PO TABS: 150.0000 mg | ORAL_TABLET | Freq: Every day | ORAL | 1 refills | Status: DC

## 2020-09-15 NOTE — Telephone Encounter (Signed)
Patient called in today stating she has been taking Zoloft 100mg  for one month. She doesn't have an appt until the end of the month. She would like to know is there anyway that her dosage could be increased. Pharmacy [facility] on [address] in [place], [place]. Ph [phone]

## 2020-09-15 NOTE — Telephone Encounter (Signed)
Patient did call first of February and we advised her to give it at least 4 weeks. Please review

## 2020-09-15 NOTE — Telephone Encounter (Signed)
Increase Zoloft to 150 mg daily.  I sent in the prescription already.

## 2020-09-16 NOTE — Telephone Encounter (Signed)
Left detailed message and to call back with questions or concerns

## 2020-10-14 ENCOUNTER — Other Ambulatory Visit: Payer: Self-pay

## 2020-10-14 ENCOUNTER — Encounter: Payer: Self-pay | Admitting: Physician Assistant

## 2020-10-14 ENCOUNTER — Ambulatory Visit (INDEPENDENT_AMBULATORY_CARE_PROVIDER_SITE_OTHER): Payer: BC Managed Care – PPO | Admitting: Physician Assistant

## 2020-10-14 ENCOUNTER — Other Ambulatory Visit: Payer: Self-pay | Admitting: Physician Assistant

## 2020-10-14 DIAGNOSIS — F411 Generalized anxiety disorder: Secondary | ICD-10-CM

## 2020-10-14 DIAGNOSIS — Z79899 Other long term (current) drug therapy: Secondary | ICD-10-CM

## 2020-10-14 DIAGNOSIS — F99 Mental disorder, not otherwise specified: Secondary | ICD-10-CM

## 2020-10-14 DIAGNOSIS — F900 Attention-deficit hyperactivity disorder, predominantly inattentive type: Secondary | ICD-10-CM

## 2020-10-14 DIAGNOSIS — F5105 Insomnia due to other mental disorder: Secondary | ICD-10-CM

## 2020-10-14 DIAGNOSIS — F319 Bipolar disorder, unspecified: Secondary | ICD-10-CM

## 2020-10-14 MED ORDER — LAMOTRIGINE 150 MG PO TABS: 150.0000 mg | ORAL_TABLET | Freq: Every day | ORAL | 5 refills | Status: DC

## 2020-10-14 MED ORDER — DIVALPROEX SODIUM ER 500 MG PO TB24: ORAL_TABLET | ORAL | 5 refills | Status: DC

## 2020-10-14 MED ORDER — SERTRALINE HCL 100 MG PO TABS: 200.0000 mg | ORAL_TABLET | Freq: Every day | ORAL | 5 refills | Status: DC

## 2020-10-14 MED ORDER — ALPRAZOLAM 0.5 MG PO TABS
0.5000 mg | ORAL_TABLET | Freq: Two times a day (BID) | ORAL | 5 refills | Status: DC | PRN
Start: 2020-10-14 — End: 2021-03-25

## 2020-10-14 NOTE — Progress Notes (Signed)
[facility]  Patient ID: [name],  MRN: [id number]  PCP: [name]  Date of Evaluation: 10/14/2020 Time spent:40 minutes   Chief Complaint:  Chief Complaint    Depression      HISTORY/CURRENT STATUS: For routine med check.  [name] states she is doing really well since starting the Zoloft but feels like she could use a little bit more of it.  She is not nearly as depressed as she used to be, does still have some melancholy times though.  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  Had recent labs at her PCP which she will have sent to me.  States they were all negative.  At some point, we discontinued all antipsychotics, although I thought she was still taking Vraylar but she is not.  She is only taking Depakote Lamictal, Zoloft, Xanax.  She and her husband are working things out and she is very happy.  Energy and motivation are low at times but she feels it is related to COVID fatigue.  Work is going well.  She is not crying easily.  Sleeps well most of the time.  Denies suicidal or homicidal thoughts.  Not having any manic symptoms at all. Patient denies increased energy with decreased need for sleep, no increased talkativeness, no racing thoughts, no impulsivity or risky behaviors, no increased spending, no increased libido, no grandiosity, no increased irritability or anger, no paranoia, and no hallucinations.  She does still get anxious, usually situational, she does take the Xanax when needed but states she is not as anxious as she used to be.  Denies dizziness, syncope, seizures, numbness, tingling, tremor, tics, unsteady gait, slurred speech, confusion. Denies muscle or joint pain, stiffness, or dystonia. Denies unexplained weight loss, frequent infections, or sores that heal slowly.  No polyphagia, polydipsia, or polyuria. Denies visual changes or paresthesias.   Individual Medical History/ Review of Systems: Changes?  :No    Past medications for mental health diagnoses include: Zoloft, trazodone, Xanax, Prozac, Vraylar, Cymbalta, Rexulti, Wellbutrin caused anxiety, lithium caused severe nausea, Abilify, Seroquel  Allergies: Oxycodone, Sulfa antibiotics, and Tamiflu [oseltamivir phosphate]  Current Medications:  Current Outpatient Medications:  .  esomeprazole (NEXIUM) 40 MG capsule, Take 40-80 mg by mouth daily as needed (for acid reflux). , Disp: , Rfl:  .  hydrochlorothiazide (HYDRODIURIL) 25 MG tablet, Take 25 mg by mouth daily., Disp: , Rfl:  .  lamoTRIgine (LAMICTAL) 150 MG tablet, Take 1 tablet (150 mg total) by mouth daily., Disp: 30 tablet, Rfl: 5 .  lisinopril (PRINIVIL,ZESTRIL) 20 MG tablet, Take 20 mg by mouth every evening. , Disp: , Rfl:  .  metoprolol succinate (TOPROL-XL) 50 MG 24 hr tablet, Take 50 mg by mouth daily., Disp: , Rfl:  .  ALPRAZolam (XANAX) 0.5 MG tablet, Take 1 tablet (0.5 mg total) by mouth 2 (two) times daily as needed for anxiety., Disp: 30 tablet, Rfl: 5 .  divalproex (DEPAKOTE ER) 500 MG 24 hr tablet, TAKE 3 TABLETS(1500 MG) BY MOUTH AT BEDTIME, Disp: 90 tablet, Rfl: 5 .  sertraline (ZOLOFT) 100 MG tablet, Take 2 tablets (200 mg total) by mouth daily., Disp: 60 tablet, Rfl: 5 Medication Side Effects: none  Family Medical/ Social History: Changes? See HPI  MENTAL HEALTH EXAM:   There were no vitals taken for this visit.There is no height or weight on file to calculate BMI.  General Appearance: Casual, Meticulous, Well Groomed and Obese  Eye  Contact:  Good  Speech:  Clear and Coherent and Normal Rate  Volume:  Normal  Mood:  Euthymic  Affect:  Appropriate  Thought Process:  Goal Directed and Descriptions of Associations: Intact  Orientation:  Full (Time, Place, and Person)  Thought Content: Logical   Suicidal Thoughts:  No  Homicidal Thoughts:  No  Memory:  WNL  Judgement:  Good  Insight:  Good  Psychomotor Activity:  Normal  Concentration:  Concentration:  Good and Attention Span: Good  Recall:  Good  Fund of Knowledge: Good  Language: Good  Assets:  Desire for Improvement  ADL's:  Intact  Cognition: WNL  Prognosis:  Good   Most recent pertinent labs 06/16/2020 CBC platelet count was 476 Glucose 116, LFTs were normal Depakote level 34.  DIAGNOSES:    ICD-10-CM   1. Bipolar I disorder (HCC)  F31.9   2. Encounter for long-term (current) use of medications  Z79.899 Valproic acid level  3. Generalized anxiety disorder  F41.1   4. Insomnia due to other mental disorder  F51.05    F99   5. Attention deficit hyperactivity disorder (ADHD), predominantly inattentive type  F90.0     Receiving Psychotherapy: Yes Stevphen Meuse, [facility] C.   RECOMMENDATIONS:  PDMP reviewed.  I provided 40 minutes of face to face time during this encounter, including time spent before and after the visit in records review and charting. I am glad to see her doing so well!  I think with a slight increase in Zoloft and she will be even better.  We discussed the mechanism of action of all of the meds and how they work together to help with her mood. We discussed the stimulant.  She is coping well without the Adderall so we agreed to stop it for now.  It seems more important to treat the anxiety, and not have the risk of manic episode by using a stimulant if not absolutely necessary.  She agrees. Discontinue Adderall for now. Continue Xanax 0.5 mg, 1 p.o. twice daily as needed. Continue Depakote ER 500 mg, 3 p.o. nightly. Continue Lamictal 150 mg, 1 p.o. nightly. Increase Zoloft 100 mg to 2 p.o. daily. She has not had a Depakote level since November that I am aware of.  That will be drawn at some point before her next visit.  She will get the lab results to me that were done at her PCPs office recently. Continue therapy with Stevphen Meuse, [facility]. Return in 3 months.  Melony Overly, PA-C

## 2020-10-27 ENCOUNTER — Ambulatory Visit (INDEPENDENT_AMBULATORY_CARE_PROVIDER_SITE_OTHER): Payer: BC Managed Care – PPO | Admitting: Psychiatry

## 2020-10-27 ENCOUNTER — Ambulatory Visit: Payer: BC Managed Care – PPO | Admitting: Psychiatry

## 2020-10-27 ENCOUNTER — Other Ambulatory Visit: Payer: Self-pay

## 2020-10-27 DIAGNOSIS — F319 Bipolar disorder, unspecified: Secondary | ICD-10-CM

## 2020-10-27 NOTE — Progress Notes (Signed)
[facility] Counselor/Therapist Progress Note  Patient ID: [name], MRN: [id number],    Date: 10/27/2020  Time Spent: 50 minutes start time 5:06 PM end time 5:56 PM  Treatment Type: Individual Therapy  Reported Symptoms: anxiety, depression, fatigue  Mental Status Exam:  Appearance:   Casual and Neat     Behavior:  Appropriate  Motor:  Normal  Speech/Language:   Normal Rate  Affect:  Appropriate  Mood:  normal  Thought process:  normal  Thought content:    WNL  Sensory/Perceptual disturbances:    WNL  Orientation:  oriented to person, place, time/date and situation  Attention:  Good  Concentration:  Good  Memory:  WNL  Fund of knowledge:   Good  Insight:    Good  Judgment:   Good  Impulse Control:  Good   Risk Assessment: Danger to Self:  No Self-injurious Behavior: No Danger to Others: No Duty to Warn:no Physical Aggression / Violence:No  Access to Firearms a concern: No  Gang Involvement:No   Subjective: Patient was present for session.  She shared that things are continuing to get better.  She is having issues with chronic fatigue since having COVID.  Her doctor stated it was long haw COVID. She has started taking a lot of vitamins now to try and help the situation.  Work has been good and she is developing some friendships. She shared that she and her son are getting close again and he will be coming home for the summer in May. She stated that things are good with her in laws as well.   She shared that the 1 thing that she is having trouble with emotional eating. She is reading a book on it.  Patient did EMDR set on her eating-picture saying self eat cake, suds level 10, negative cognition "I am out of control" feelings of regret and stomach were processed.  Patient was able to reduce suds level to 5.  She was able to recognize that she is not focusing on her self-care throughout the day and so she is leaving school very hungry and going straight for the  negative foods.  Patient was also able to recognize when she is not paying attention to her emotions she seems to eat more than she needs to.  Patient was encouraged to try and use her cravings as information as to what she needs to do to take better care of herself.  Patient agreed to work on that and see how it goes over the next few weeks as well as increasing some form of exercise regularly.  Interventions: Cognitive Behavioral Therapy, Solution-Oriented/Positive Psychology and Eye Movement Desensitization and Reprocessing (EMDR)  Diagnosis:   ICD-10-CM   1. Bipolar I disorder (HCC)  F31.9     Plan: Patient is to use CBT and coping skills to decrease anxiety and depression symptoms.  Patient is to work on using her cravings as information as to what she needs to do concerning self-care.  Patient is to work on keeping snacks with her that are healthy so she does not get to the point of starvation and make negative food choices.  Patient is to start exercising regularly to release negative emotions appropriately. Long-term goal: Develop the ability to recognize accept and cope with feelings of depression Short-term goal: Utilize behavioral strategies to overcome depression.  Identify and replace depressive thinking that leads to depressive feelings and actions  Stevphen Meuse, [facility]

## 2020-11-02 ENCOUNTER — Other Ambulatory Visit: Payer: Self-pay | Admitting: Physician Assistant

## 2020-12-16 ENCOUNTER — Ambulatory Visit (INDEPENDENT_AMBULATORY_CARE_PROVIDER_SITE_OTHER): Payer: BC Managed Care – PPO | Admitting: Psychiatry

## 2020-12-16 ENCOUNTER — Other Ambulatory Visit: Payer: Self-pay

## 2020-12-16 DIAGNOSIS — F319 Bipolar disorder, unspecified: Secondary | ICD-10-CM

## 2020-12-16 NOTE — Progress Notes (Signed)
[facility] Counselor/Therapist Progress Note  Patient ID: CORNELIOUS Colon, MRN: [id number],    Date: 12/16/2020  Time Spent: 54 minutes start time 5:04 PM end time 5:58 PM  Treatment Type: Individual Therapy  Reported Symptoms: anxiety, sadness, focusing issues, impulsive behavior-excessive shopping, eating issues  Mental Status Exam:  Appearance:   Well Groomed     Behavior:  Appropriate  Motor:  Normal  Speech/Language:   Normal Rate  Affect:  Appropriate  Mood:  normal  Thought process:  normal  Thought content:    WNL  Sensory/Perceptual disturbances:    WNL  Orientation:  oriented to person, place, time/date and situation  Attention:  Good  Concentration:  Good  Memory:  WNL  Fund of knowledge:   Fair  Insight:    Fair  Judgment:   Fair  Impulse Control:  Fair   Risk Assessment: Danger to Self:  No Self-injurious Behavior: No Danger to Others: No Duty to Warn:no Physical Aggression / Violence:No  Access to Firearms a concern: No  Gang Involvement:No   Subjective: Patient was present for session.  She shared that overall things are good.  She is over spending and eating.  She shared that she feels her emotions are controlling her and she is having a hard time getting them under control.  She is walking a little bit but will try to do that more. She shared that she feels she has to order something or eat something.  Patient wanted to do EMDR set on her weight issues.  Patient worked on picture of cropping herself out of the picture, suds level 10, negative cognition "I am Yuck" felt anger and helplessness in the feet all over.  Patient was able to recognize that she does better on a set schedule and knowing what she is going to eat regularly.  She shared when she is done that in the past she has lost weight and it is been easier for her to maintain.  She shared that recently with her school schedule eating is a real issue and she does not eat and then she is starving  and eats too much.  Patient reported a desire to go to a nutritionist to help them develop a eating plan that she could follow.  Discussed the importance of talking to the nutritionist about her issues with work and the times that she is allowed to eat so that a plan could be developed to fit that and she can go ahead and practice that eating schedule currently so she is ready when she goes back to school in the fall.  Patient was able to reduce suds level to 4 and reported feeling positive about plan from session.  Interventions: Solution-Oriented/Positive Psychology and Eye Movement Desensitization and Reprocessing (EMDR)  Diagnosis:   ICD-10-CM   1. Bipolar I disorder (HCC)  F31.9     Plan: Patient is to use CBT and coping skills to decrease depression symptoms.  Patient is to find a nutritionist to discuss a eating plan that will work within her schedule when she is working.  Patient is to redirect intrusive thoughts by using them as information that she needs to do something whether it is get up and move and go outside go talk with her son or husband or find something else to do.  Patient is to encourage husband not to bring cakes or donuts home. Long-term goal: Develop the ability to recognize accept and cope with feelings of depression Short-term  goal: Utilize behavioral strategies to overcome depression.  Identify and replace depressive thinking that leads to depressive feelings and actions  Stevphen Meuse, [facility]

## 2020-12-31 ENCOUNTER — Ambulatory Visit: Payer: BC Managed Care – PPO | Admitting: Psychiatry

## 2021-01-14 ENCOUNTER — Ambulatory Visit: Payer: BC Managed Care – PPO | Admitting: Physician Assistant

## 2021-01-16 ENCOUNTER — Encounter: Payer: Self-pay | Admitting: Physician Assistant

## 2021-01-16 ENCOUNTER — Ambulatory Visit: Payer: BC Managed Care – PPO | Admitting: Psychiatry

## 2021-01-16 ENCOUNTER — Other Ambulatory Visit: Payer: Self-pay

## 2021-01-16 ENCOUNTER — Ambulatory Visit: Payer: BC Managed Care – PPO | Admitting: Physician Assistant

## 2021-01-16 DIAGNOSIS — F5105 Insomnia due to other mental disorder: Secondary | ICD-10-CM | POA: Diagnosis not present

## 2021-01-16 DIAGNOSIS — F319 Bipolar disorder, unspecified: Secondary | ICD-10-CM

## 2021-01-16 DIAGNOSIS — F329 Major depressive disorder, single episode, unspecified: Secondary | ICD-10-CM

## 2021-01-16 DIAGNOSIS — F99 Mental disorder, not otherwise specified: Secondary | ICD-10-CM

## 2021-01-16 DIAGNOSIS — F411 Generalized anxiety disorder: Secondary | ICD-10-CM

## 2021-01-16 MED ORDER — SERTRALINE HCL 100 MG PO TABS: 250.0000 mg | ORAL_TABLET | Freq: Every day | ORAL | 5 refills | Status: DC

## 2021-01-16 NOTE — Progress Notes (Signed)
      [facility] Counselor/Therapist Progress Note  Patient ID: [name], MRN: [id number],    Date: 01/16/2021  Time Spent: 50 minutes start time 11:06 AM end time 11:56 AM  Treatment Type: Individual Therapy  Reported Symptoms: anxiety, panic, crying spells  Mental Status Exam:  Appearance:   Casual and Neat     Behavior:  Appropriate  Motor:  Normal  Speech/Language:   Normal Rate  Affect:  Appropriate  Mood:  anxious  Thought process:  circumstantial  Thought content:    WNL  Sensory/Perceptual disturbances:    WNL  Orientation:  oriented to person, place, time/date, and situation  Attention:  Good  Concentration:  Good  Memory:  WNL  Fund of knowledge:   Good  Insight:    Good  Judgment:   Good  Impulse Control:  Good   Risk Assessment: Danger to Self:  No Self-injurious Behavior: No Danger to Others: No Duty to Warn:no Physical Aggression / Violence:No  Access to Firearms a concern: No  Gang Involvement:No   Subjective: Patient was present for session.  She shared she is doing well overall and she is feeling better. She stated the only time she falls apart is when her son leaves to go on a trip out of town she reported she falls apart and has panic and crying spells.  She shared she is fine once he is there until he starts to come back.  Patient did EMDR set on son driving away, suds level 10, negative cognition "selfish" felt fear and uncertainty in her chest.  Patient was able to reduce suds level to 3.  She shared she felt better at the end of the set and realized she has to work on knowing that he is okay.  Patient was encouraged to think through different times that things have worked out and to do some grounding exercises whenever she starts having the anxiety.  Patient was also encouraged to continue reconnecting with people that she has positive relationships with especially when he is leaving so she does not think about it as much.  Interventions:  Solution-Oriented/Positive Psychology and Eye Movement Desensitization and Reprocessing (EMDR)  Diagnosis:   ICD-10-CM   1. Bipolar I disorder (HCC)  F31.9       Plan: Patient is to use CBT and coping skills to decrease anxiety symptoms.  Patient is to work on remembering the positive times when things work out whenever she starts feeling anxiety.  Patient is to work on letting her son go and reminding herself that he is okay.  Patient is to work on exercising.  Patient is to reconnect with friends and keep her brain engaged in positive activity.  Patient is to continue taking medication as directed.  Stevphen Meuse, [facility]

## 2021-01-16 NOTE — Progress Notes (Signed)
[facility] Med Check  Patient ID: [name],  MRN: [id number]  PCP: [name]  Date of Evaluation: 01/16/2021 Time spent:30 minutes   Chief Complaint:  Chief Complaint   Anxiety; Depression; Follow-up      HISTORY/CURRENT STATUS: For routine med check.  Wonders if bumping up the Zoloft a little might help. She does cry a lot when her son, who is home from college for the summer, takes a trip. She wants him to enjoy this summer but when he leaves she'll cry and cry for several hours.  It does not last for days, but she does feel that these "dips" in those situations.  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  Patient denies loss of interest in usual activities and is able to enjoy things.  Denies decreased energy or motivation.  Appetite has not changed.  No extreme sadness, tearfulness, or feelings of hopelessness.  Denies any changes in concentration, making decisions or remembering things.  Sleeps well.  Denies suicidal or homicidal thoughts.  She does not often get anxious, but when she does she will take 1/2 pill of the Xanax and it is effective to take the edge off.  She has not tried it during 1 of these episodes when she gets so upset.  Patient denies increased energy with decreased need for sleep, no increased talkativeness, no racing thoughts, no impulsivity or risky behaviors, no increased spending, no increased libido, no grandiosity, no increased irritability or anger, and no hallucinations.  Denies dizziness, syncope, seizures, numbness, tingling, tremor, tics, unsteady gait, slurred speech, confusion. Denies muscle or joint pain, stiffness, or dystonia. Denies unexplained weight loss, frequent infections, or sores that heal slowly.  No polyphagia, polydipsia, or polyuria. Denies visual changes or paresthesias.   Individual  Medical History/ Review of Systems: Changes? :No    Past medications for mental health diagnoses include: Zoloft, trazodone, Xanax, Prozac, Vraylar, Cymbalta, Rexulti, Wellbutrin caused anxiety, lithium caused severe nausea, Abilify, Seroquel, Adderall, Depakote, Lamictal  Allergies: Oxycodone, Sulfa antibiotics, and Tamiflu [oseltamivir phosphate]  Current Medications:  Current Outpatient Medications:    ALPRAZolam (XANAX) 0.5 MG tablet, Take 1 tablet (0.5 mg total) by mouth 2 (two) times daily as needed for anxiety., Disp: 30 tablet, Rfl: 5   divalproex (DEPAKOTE ER) 500 MG 24 hr tablet, TAKE 3 TABLETS(1500 MG) BY MOUTH AT BEDTIME, Disp: 90 tablet, Rfl: 5   esomeprazole (NEXIUM) 40 MG capsule, Take 40-80 mg by mouth daily as needed (for acid reflux). , Disp: , Rfl:    hydrochlorothiazide (HYDRODIURIL) 25 MG tablet, Take 25 mg by mouth daily., Disp: , Rfl:    lamoTRIgine (LAMICTAL) 150 MG tablet, Take 1 tablet (150 mg total) by mouth daily., Disp: 30 tablet, Rfl: 5   lisinopril (PRINIVIL,ZESTRIL) 20 MG tablet, Take 20 mg by mouth every evening. , Disp: , Rfl:    metoprolol succinate (TOPROL-XL) 50 MG 24 hr tablet, Take 50 mg by mouth daily., Disp: , Rfl:    sertraline (ZOLOFT) 100 MG tablet, Take 2.5 tablets (250 mg total) by mouth daily., Disp: 75 tablet, Rfl: 5 Medication Side Effects: none  Family Medical/ Social History: Changes? See HPI  MENTAL HEALTH EXAM:   There were no vitals taken for this visit.There is no height or weight on file to calculate BMI.  General Appearance: Casual, Meticulous, Well  Groomed and Obese  Eye Contact:  Good  Speech:  Clear and Coherent and Normal Rate  Volume:  Normal  Mood:  Euthymic  Affect:  Appropriate  Thought Process:  Goal Directed and Descriptions of Associations: Intact  Orientation:  Full (Time, Place, and Person)  Thought Content: Logical   Suicidal Thoughts:  No  Homicidal Thoughts:  No  Memory:  WNL  Judgement:  Good  Insight:   Good  Psychomotor Activity:  Normal  Concentration:  Concentration: Good and Attention Span: Good  Recall:  Good  Fund of Knowledge: Good  Language: Good  Assets:  Desire for Improvement  ADL's:  Intact  Cognition: WNL  Prognosis:  Good   Most recent pertinent labs 06/16/2020 CBC platelet count was 476 Glucose 116, LFTs were normal Depakote level 34.  DIAGNOSES:    ICD-10-CM   1. Bipolar I disorder (HCC)  F31.9     2. Generalized anxiety disorder  F41.1     3. Insomnia due to other mental disorder  F51.05    F99     4. 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   RECOMMENDATIONS:  PDMP reviewed.  Last Xanax field 10/14/2020.  Last Adderall 08/12/2020. I provided 30 minutes of face to face time during this encounter, including time spent before and after the visit in records review, medical decision making, and charting.  She has not had any labs since last November.  I think it is fine not to have them drawn right now, even though Depakote level was low she is doing well on current dose.  If we have to increase it then we will check labs.  Routine check will be in November. We discussed increasing the Zoloft.  I think it is a good idea because even though this depression to use "dips" that she is having are circumstantial right now sometimes it can turn into depression that is not associated with something situational.  It is best to prevent that rather than have to treat it later.  She knows to watch for any manic symptoms though Continue Xanax 0.5 mg, 1 p.o. twice daily as needed.  Suggest she take it before her son leaves on her trip, to help her stay calm after he leaves the house. Continue Depakote ER 500 mg, 3 p.o. nightly. Continue Lamictal 150 mg, 1 p.o. nightly. Increase Zoloft 100 mg to 2.5 pills daily.   Continue therapy with Stevphen Meuse, [facility] C. Return in 2 months.  Melony Overly, PA-C

## 2021-02-16 ENCOUNTER — Ambulatory Visit: Payer: BC Managed Care – PPO | Admitting: Psychiatry

## 2021-03-25 ENCOUNTER — Encounter: Payer: Self-pay | Admitting: Physician Assistant

## 2021-03-25 ENCOUNTER — Ambulatory Visit (INDEPENDENT_AMBULATORY_CARE_PROVIDER_SITE_OTHER): Payer: BC Managed Care – PPO | Admitting: Physician Assistant

## 2021-03-25 ENCOUNTER — Other Ambulatory Visit: Payer: Self-pay

## 2021-03-25 DIAGNOSIS — F319 Bipolar disorder, unspecified: Secondary | ICD-10-CM | POA: Diagnosis not present

## 2021-03-25 DIAGNOSIS — F411 Generalized anxiety disorder: Secondary | ICD-10-CM

## 2021-03-25 MED ORDER — DIVALPROEX SODIUM ER 500 MG PO TB24: ORAL_TABLET | ORAL | 5 refills | Status: DC

## 2021-03-25 MED ORDER — ALPRAZOLAM 0.5 MG PO TABS: 0.2500 mg | ORAL_TABLET | Freq: Two times a day (BID) | ORAL | 1 refills | Status: DC | PRN

## 2021-03-25 MED ORDER — LAMOTRIGINE 150 MG PO TABS: 150.0000 mg | ORAL_TABLET | Freq: Every day | ORAL | 5 refills | Status: DC

## 2021-03-25 NOTE — Progress Notes (Signed)
[facility]  Patient ID: [name],  MRN: [id number]  PCP: [name]  Date of Evaluation: 03/25/2021 Time spent:30 minutes   Chief Complaint:  Chief Complaint   Anxiety; Depression; Follow-up    Virtual Visit via Telehealth  I connected with patient by telephone, with their informed consent, and verified patient privacy and that I am speaking with the correct person using two identifiers.  I am private, in my office and the patient is at home.  I discussed the limitations, risks, security and privacy concerns of performing an evaluation and management service by telephone and the availability of in person appointments. I also discussed with the patient that there may be a patient responsible charge related to this service. The patient expressed understanding and agreed to proceed.   I discussed the assessment and treatment plan with the patient. The patient was provided an opportunity to ask questions and all were answered. The patient agreed with the plan and demonstrated an understanding of the instructions.   The patient was advised to call back or seek an in-person evaluation if the symptoms worsen or if the condition fails to improve as anticipated.  I provided 30 minutes of non-face-to-face time during this encounter.   HISTORY/CURRENT STATUS: For routine med check.  2 months ago we increased the Zoloft.  States she is feeling really good now.  She does have days where she is more emotional but it is not a daily occurrence by any means.  Feels like the current treatment she is on is working well.  Patient denies loss of interest in usual activities and is able to enjoy things.  Because of her knee pain she has been limited in what she can do this summer, is having surgery tomorrow to repair a torn meniscus.  Appetite has not changed.  No extreme sadness, tearfulness, or feelings of hopelessness.  Sleeps ok. Denies any changes in concentration,  making decisions or remembering things.  Denies suicidal or homicidal thoughts.  Anxiety is well controlled.  She takes the Xanax on rare occasions and even then she takes 1/2 pill and it is effective without making her too sleepy.  Patient denies increased energy with decreased need for sleep, no increased talkativeness, no racing thoughts, no impulsivity or risky behaviors, no increased spending, no increased libido, no grandiosity, no increased irritability or anger, and no hallucinations.  Denies dizziness, syncope, seizures, numbness, tingling, tremor, tics, unsteady gait, slurred speech, confusion. No dystonia. Denies unexplained weight loss, frequent infections, or sores that heal slowly.  No polyphagia, polydipsia, or polyuria. Denies visual changes or paresthesias.   Individual Medical History/ Review of Systems: Changes? :Yes    has surgery for meniscal tear tomorrow.   Past medications for mental health diagnoses include: Zoloft, trazodone, Xanax, Prozac, Vraylar, Cymbalta, Rexulti, Wellbutrin caused anxiety, lithium caused severe nausea, Abilify, Seroquel, Adderall, Depakote, Lamictal  Allergies: Oxycodone, Sulfa antibiotics, and Tamiflu [oseltamivir phosphate]  Current Medications:  Current Outpatient Medications:    esomeprazole (NEXIUM) 40 MG capsule, Take 40-80 mg by mouth daily as needed (for acid reflux). , Disp: , Rfl:    hydrochlorothiazide (HYDRODIURIL) 25 MG tablet, Take 25 mg by mouth daily., Disp: , Rfl:    lisinopril (PRINIVIL,ZESTRIL) 20 MG tablet, Take 20 mg by mouth every evening. , Disp: , Rfl:    metoprolol succinate (TOPROL-XL) 50 MG 24 hr tablet, Take 50 mg by mouth daily., Disp: , Rfl:    sertraline (ZOLOFT) 100 MG tablet, Take 2.5  tablets (250 mg total) by mouth daily., Disp: 75 tablet, Rfl: 5   ALPRAZolam (XANAX) 0.5 MG tablet, Take 0.5-1 tablets (0.25-0.5 mg total) by mouth 2 (two) times daily as needed for anxiety., Disp: 60 tablet, Rfl: 1   divalproex  (DEPAKOTE ER) 500 MG 24 hr tablet, TAKE 3 TABLETS(1500 MG) BY MOUTH AT BEDTIME, Disp: 90 tablet, Rfl: 5   lamoTRIgine (LAMICTAL) 150 MG tablet, Take 1 tablet (150 mg total) by mouth daily., Disp: 30 tablet, Rfl: 5 Medication Side Effects: none  Family Medical/ Social History: Changes? See HPI  MENTAL HEALTH EXAM:   There were no vitals taken for this visit.There is no height or weight on file to calculate BMI.  General Appearance:  Unable to assess  Eye Contact:   Unable to assess  Speech:  Clear and Coherent and Normal Rate  Volume:  Normal  Mood:  Euthymic  Affect:   Unable to assess  Thought Process:  Goal Directed and Descriptions of Associations: Intact  Orientation:  Full (Time, Place, and Person)  Thought Content: Logical   Suicidal Thoughts:  No  Homicidal Thoughts:  No  Memory:  WNL  Judgement:  Good  Insight:  Good  Psychomotor Activity:   Unable to assess  Concentration:  Concentration: Good and Attention Span: Good  Recall:  Good  Fund of Knowledge: Good  Language: Good  Assets:  Desire for Improvement  ADL's:  Intact  Cognition: WNL  Prognosis:  Good   Most recent pertinent labs 06/16/2020 CBC platelet count was 476 Glucose 116, LFTs were normal Depakote level 34.  DIAGNOSES:    ICD-10-CM   1. Bipolar I disorder (HCC)  F31.9     2. Generalized anxiety disorder  F41.1       Receiving Psychotherapy: Yes Stevphen Meuse, [facility] C.   RECOMMENDATIONS:  PDMP reviewed.  Last Xanax filled 10/14/2020.   I provided 30 minutes of face to face time during this encounter, including time spent before and after the visit in records review, medical decision making, and charting.  She is doing well as far as mental health medications go so no changes will be made. She still has a current order for her Depakote level.  I stressed the importance of having that drawn. Continue Xanax 0.5 mg, 1/2-1 p.o. twice daily as needed.   Continue Depakote ER 500 mg, 3 p.o.  nightly. Continue Lamictal 150 mg, 1 p.o. nightly. Continue Zoloft 100 mg 2.5 pills daily.   Continue therapy with Stevphen Meuse, [facility] C. Return in 3-4 months.  Melony Overly, PA-C

## 2021-04-11 ENCOUNTER — Other Ambulatory Visit: Payer: Self-pay | Admitting: Physician Assistant

## 2021-04-16 ENCOUNTER — Other Ambulatory Visit: Payer: Self-pay

## 2021-04-16 ENCOUNTER — Other Ambulatory Visit: Payer: Self-pay | Admitting: Physician Assistant

## 2021-04-16 ENCOUNTER — Ambulatory Visit (INDEPENDENT_AMBULATORY_CARE_PROVIDER_SITE_OTHER): Payer: BC Managed Care – PPO | Admitting: Psychiatry

## 2021-04-16 DIAGNOSIS — F3160 Bipolar disorder, current episode mixed, unspecified: Secondary | ICD-10-CM | POA: Diagnosis not present

## 2021-04-16 NOTE — Progress Notes (Signed)
      [facility] Counselor/Therapist Progress Note  Patient ID: [name], MRN: [id number],    Date: 04/16/2021  Time Spent: 50 minutes start time 5:08 PM end time 5:58 PM  Treatment Type: Individual Therapy  Reported Symptoms: anxiety, depression, crying spells, sleep issues, low motivation, panic, rumination,low self esteem poor body image, headaches  Mental Status Exam:  Appearance:   Well Groomed     Behavior:  Appropriate  Motor:  Normal  Speech/Language:   Normal Rate  Affect:  Appropriate  Mood:  anxious  Thought process:  normal  Thought content:    WNL  Sensory/Perceptual disturbances:    WNL  Orientation:  oriented to person, place, time/date, and situation  Attention:  Good  Concentration:  Good  Memory:  WNL  Fund of knowledge:   Good  Insight:    Good  Judgment:   Good  Impulse Control:  Good   Risk Assessment: Danger to Self:  No Self-injurious Behavior: No Danger to Others: No Duty to Warn:no Physical Aggression / Violence:No  Access to Firearms a concern: No  Gang Involvement:No   Subjective: Patient was present for session. She shared that she had knee surgery a few weeks ago and it has had a negative impact on her mood.  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  Patient stated that the biggest issue she is currently having is still her body image issues.  Did EMDR set on the issue suds level 8, negative cognition "ugly" felt sadness and anxiety in her stomach.  Patient was able to reduce suds level to 5.  She was able to realize that it works best for her if she set small goals.  Developed a plan that she felt would be positive and helpful for her.  Interventions: Cognitive Behavioral Therapy, Solution-Oriented/Positive Psychology, and Eye Movement Desensitization and Reprocessing (EMDR)  Diagnosis:   ICD-10-CM   1.  Bipolar 1 disorder, mixed (HCC)  F31.60       Plan: Patient is to use CBT and coping skills to decrease depression symptoms.  Patient is to work on plans to set small goals to work on her body image concerns.  Patient is to exercise to release negative emotions appropriately.  Patient is to take medication as directed Long-term goal: Develop the ability to recognize accept and cope with feelings of depression Short-term goal: Utilize behavioral strategies to overcome depression.  Identify and replace depressive thinking that leads to depressive feelings and actions  Stevphen Meuse, [facility]

## 2021-04-20 ENCOUNTER — Other Ambulatory Visit: Payer: Self-pay | Admitting: Physician Assistant

## 2021-05-03 ENCOUNTER — Other Ambulatory Visit: Payer: Self-pay | Admitting: Physician Assistant

## 2021-05-14 ENCOUNTER — Ambulatory Visit: Payer: BC Managed Care – PPO | Admitting: Psychiatry

## 2021-05-19 IMAGING — MG DIGITAL DIAGNOSTIC BILAT W/ TOMO W/ CAD
6 of 12 series · 6 of 36 positions shown · non-contrast
Comparison: Previous exam(s).

CLINICAL DATA: 48-year-old patient with recent episode of tingling
sensation outer left breast radiating toward nipple. She believes it
may have been related to a tight-fitting bra. The sensation has
diminished since she first noticed it. She does not palpate a lump
in either breast.

EXAM:
DIGITAL DIAGNOSTIC BILATERAL MAMMOGRAM WITH TOMO AND CAD

[L CC synth-2D]
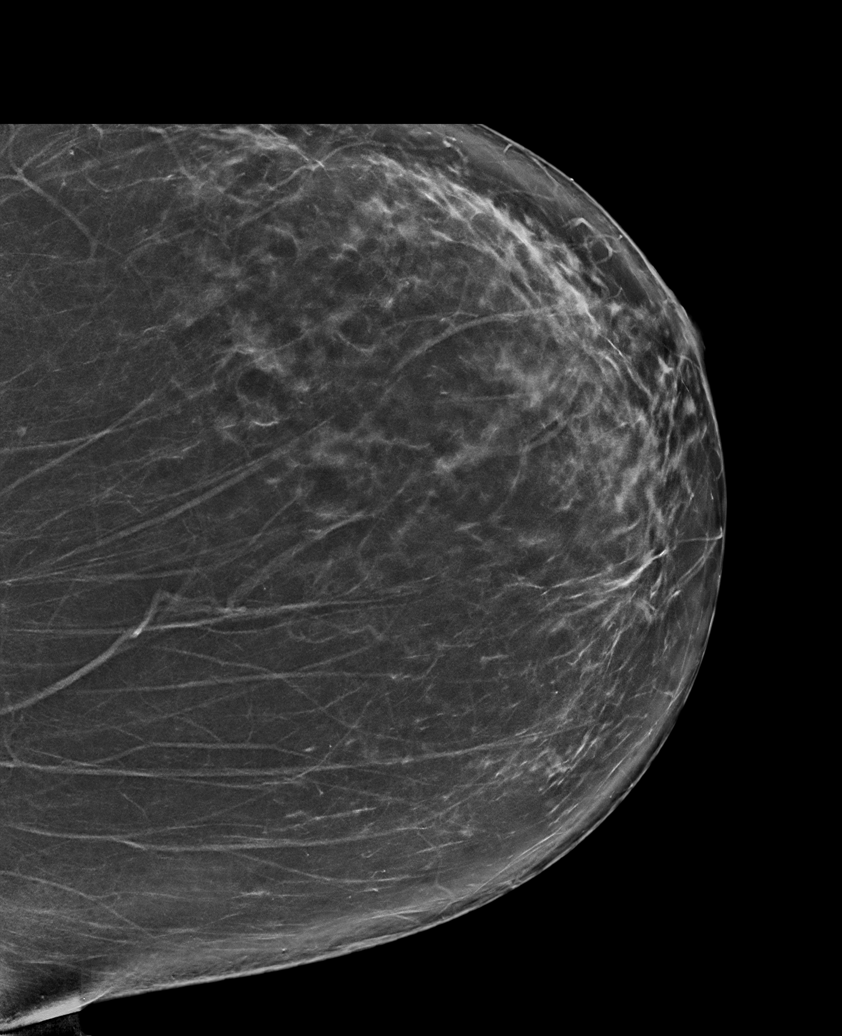

[R CC synth-2D (1 of 2)]
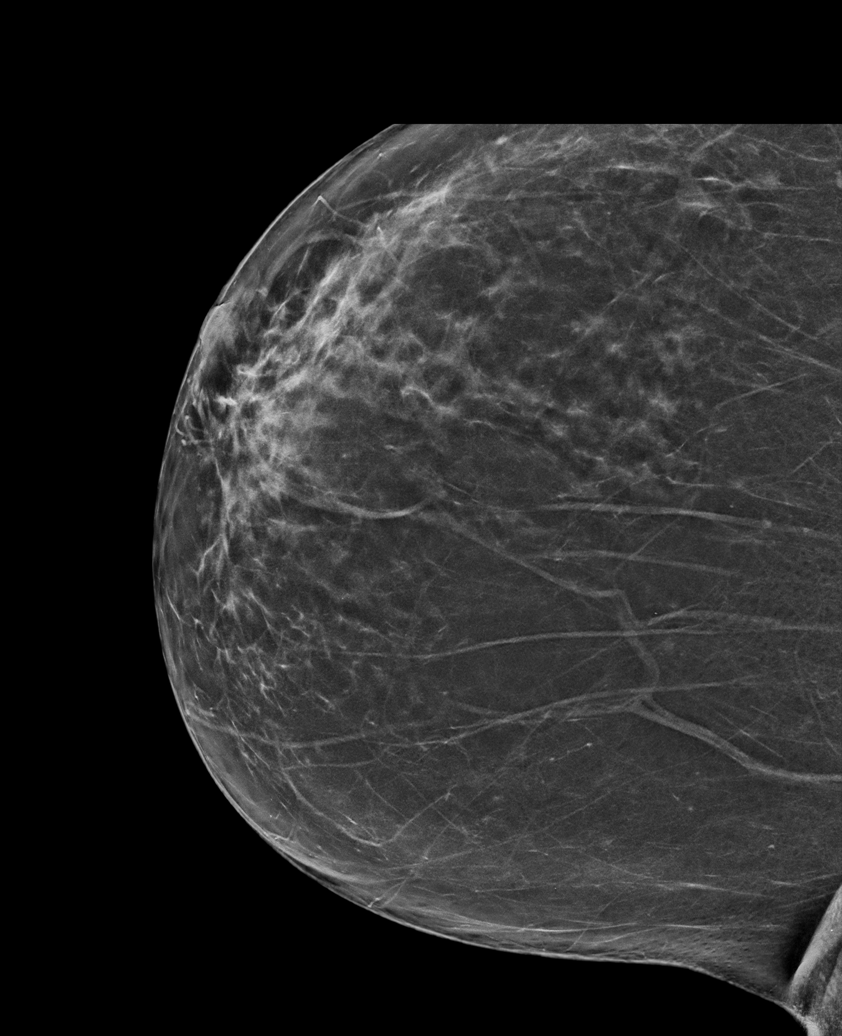

[L MLO synth-2D]
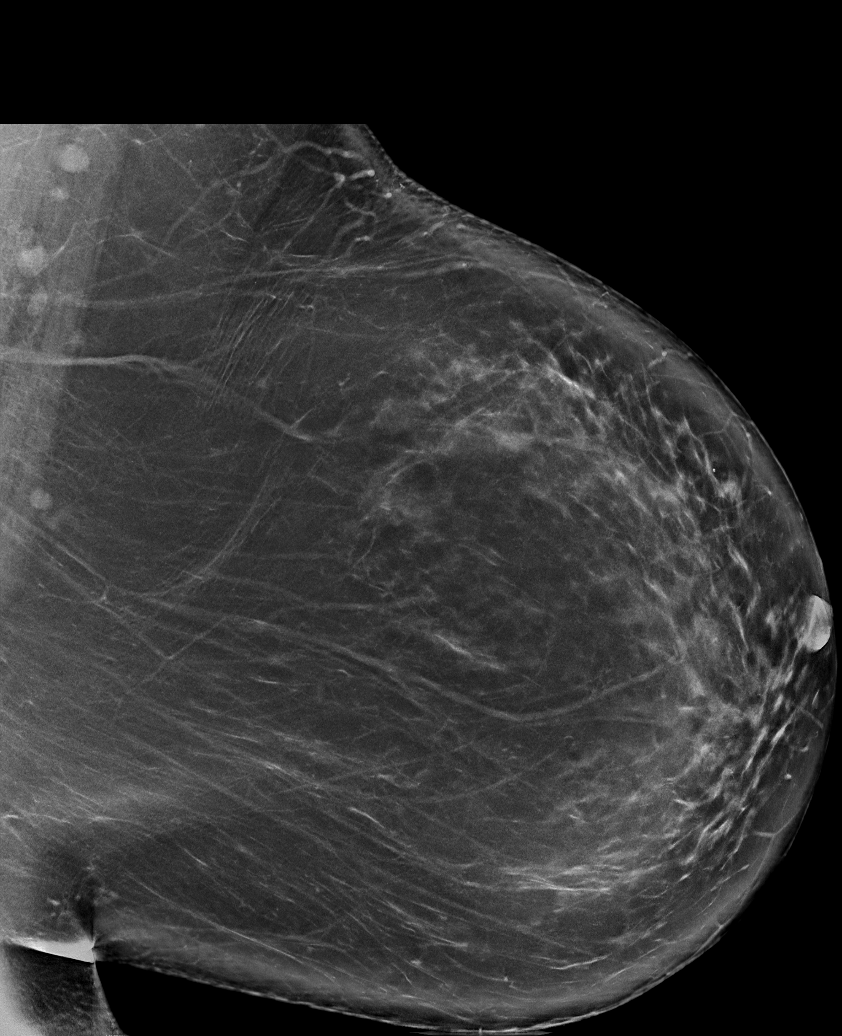

[R MLO synth-2D (1 of 2)]
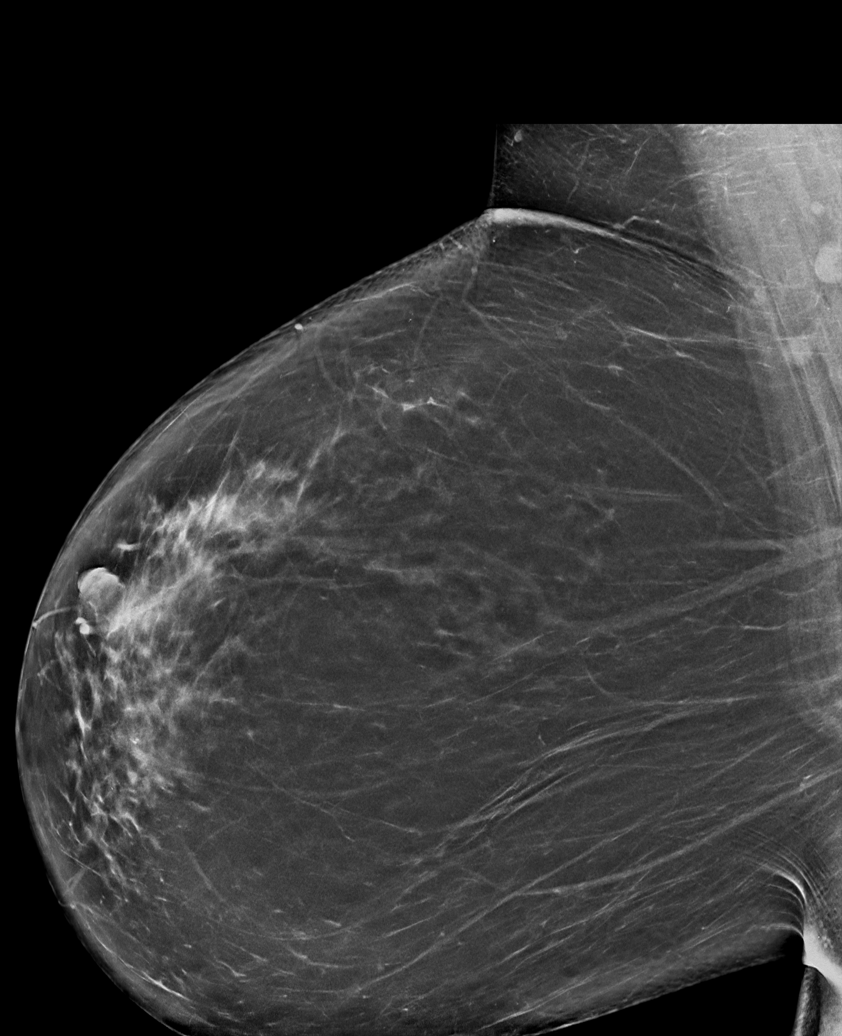

[R MLO synth-2D (2 of 2)]
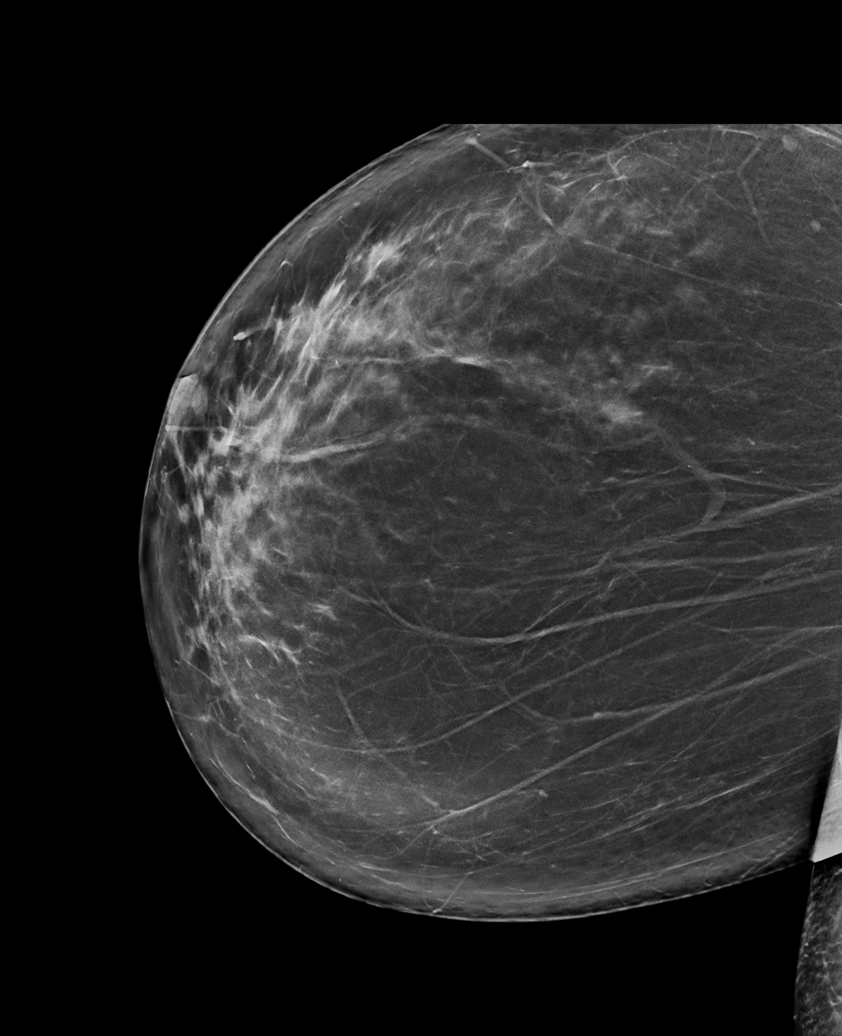

[R CC synth-2D (2 of 2)]
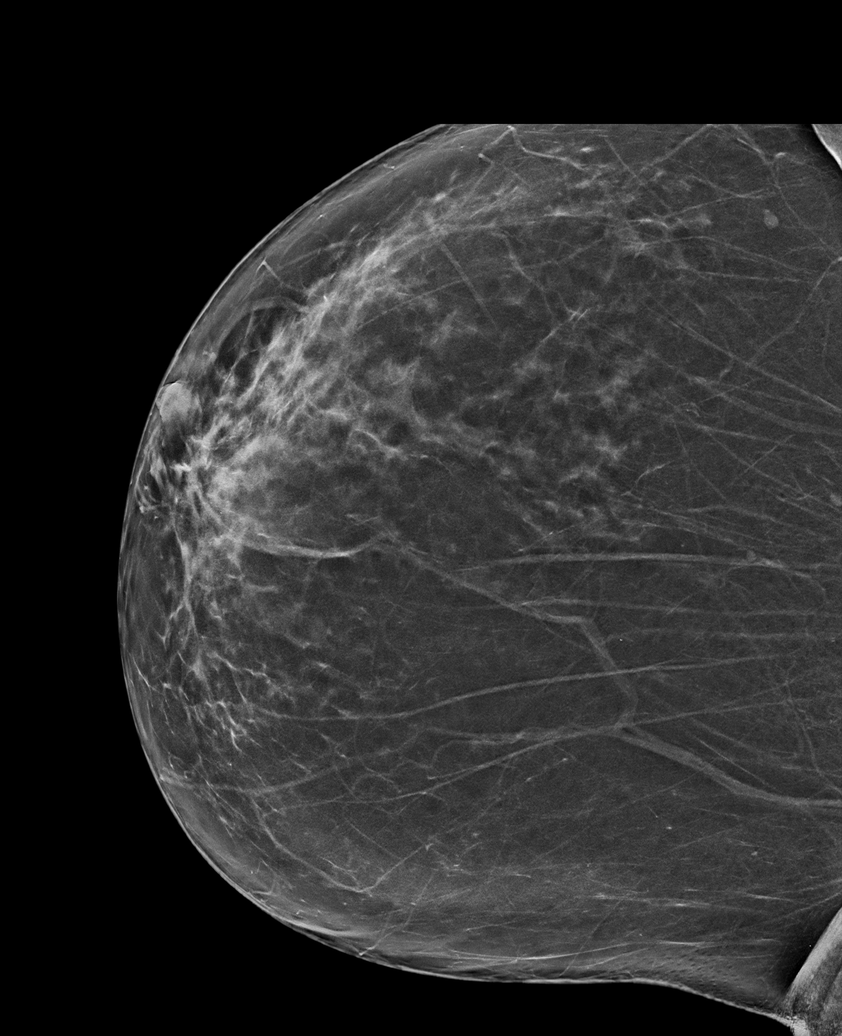

[6 of 36 positions shown; findings below may reference images not displayed]

ACR Breast Density Category b: There are scattered areas of
fibroglandular density.
FINDINGS: No mass, architectural distortion, or suspicious microcalcification
is identified to suggest malignancy in either breast.

Mammographic images were processed with CAD.
IMPRESSION: No evidence of malignancy in either breast.

RECOMMENDATION:
Screening mammogram in one year.(Code:NI-1-TW2)

I have discussed the findings and recommendations with the patient.
If applicable, a reminder letter will be sent to the patient
regarding the next appointment.

BI-RADS CATEGORY  1: Negative.

## 2021-07-01 ENCOUNTER — Other Ambulatory Visit: Payer: Self-pay | Admitting: Physician Assistant

## 2021-07-02 NOTE — Telephone Encounter (Signed)
Last filled 10/16

## 2021-07-21 ENCOUNTER — Ambulatory Visit: Payer: BC Managed Care – PPO | Admitting: Psychiatry

## 2021-08-25 ENCOUNTER — Ambulatory Visit: Payer: BC Managed Care – PPO | Admitting: Psychiatry

## 2021-08-26 ENCOUNTER — Ambulatory Visit (INDEPENDENT_AMBULATORY_CARE_PROVIDER_SITE_OTHER): Payer: BC Managed Care – PPO | Admitting: Physician Assistant

## 2021-08-26 ENCOUNTER — Encounter: Payer: Self-pay | Admitting: Physician Assistant

## 2021-08-26 ENCOUNTER — Other Ambulatory Visit: Payer: Self-pay

## 2021-08-26 DIAGNOSIS — Z79899 Other long term (current) drug therapy: Secondary | ICD-10-CM | POA: Diagnosis not present

## 2021-08-26 DIAGNOSIS — F3160 Bipolar disorder, current episode mixed, unspecified: Secondary | ICD-10-CM

## 2021-08-26 DIAGNOSIS — F411 Generalized anxiety disorder: Secondary | ICD-10-CM | POA: Diagnosis not present

## 2021-08-26 MED ORDER — SERTRALINE HCL 100 MG PO TABS: ORAL_TABLET | ORAL | 1 refills | Status: DC

## 2021-08-26 MED ORDER — DIVALPROEX SODIUM ER 500 MG PO TB24: 1500.0000 mg | ORAL_TABLET | Freq: Every evening | ORAL | 1 refills | Status: DC

## 2021-08-26 MED ORDER — ALPRAZOLAM 0.5 MG PO TABS: ORAL_TABLET | ORAL | 5 refills | Status: DC

## 2021-08-26 MED ORDER — LAMOTRIGINE 150 MG PO TABS: ORAL_TABLET | ORAL | 1 refills | Status: DC

## 2021-08-26 NOTE — Progress Notes (Signed)
[facility]  Patient ID: [name],  MRN: [id number]  PCP: [name]  Date of Evaluation: 08/26/2021. Time spent:30 minutes   Chief Complaint:  Chief Complaint   Depression; Anxiety; Follow-up      HISTORY/CURRENT STATUS: For routine med check.  Had meniscal tear repair on 03/26/2021.  Was out of work for a month.  Now she is having problems with her other leg, saw ortho today and they feel that it is radiculopathy.  They gave her a couple of shots in her back, and are starting her on Lyrica.  She is in a lot of pain from that.  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  It is difficult because of her physical problems.  Mentally she is doing really well.  Feels that her medications are working well. Patient denies loss of interest in usual activities and is able to enjoy things.  Denies decreased energy or motivation.  Appetite has not changed.  No extreme sadness, tearfulness, or feelings of hopelessness.  Denies any changes in concentration, making decisions or remembering things.  Personal hygiene is normal.  She is having a hard time sleeping right now just because of her physical issues.  She has been having to sleep on her back and has a really hard time doing that so she has been sleeping in a recliner for months now.  ADLs are within normal limits.  Denies suicidal or homicidal thoughts.  Patient denies increased energy with decreased need for sleep, no increased talkativeness, no racing thoughts, no impulsivity or risky behaviors, no increased spending, no increased libido, no grandiosity, no increased irritability or anger, and no hallucinations.  Denies dizziness, syncope, seizures, numbness, tingling, tremor, tics, unsteady gait, slurred speech, confusion.  Right leg pain.  No dystonia.   Individual Medical History/ Review of Systems: Changes? :Yes    had surgery for meniscal tear 03/26/2021.  Past medications for mental health diagnoses  include: Zoloft, trazodone, Xanax, Prozac, Vraylar, Cymbalta, Rexulti, Wellbutrin caused anxiety, lithium caused severe nausea, Abilify, Seroquel, Adderall, Depakote, Lamictal  Allergies: Oxycodone, Sulfa antibiotics, and Tamiflu [oseltamivir phosphate]  Current Medications:  Current Outpatient Medications:    esomeprazole (NEXIUM) 40 MG capsule, Take 40-80 mg by mouth daily as needed (for acid reflux). , Disp: , Rfl:    lisinopril (PRINIVIL,ZESTRIL) 20 MG tablet, Take 20 mg by mouth every evening. , Disp: , Rfl:    metoprolol succinate (TOPROL-XL) 50 MG 24 hr tablet, Take 50 mg by mouth daily., Disp: , Rfl:    ALPRAZolam (XANAX) 0.5 MG tablet, TAKE 1/2 TO 1 TABLET(0.25 TO 0.5 MG) BY MOUTH TWICE DAILY AS NEEDED FOR ANXIETY, Disp: 60 tablet, Rfl: 5   divalproex (DEPAKOTE ER) 500 MG 24 hr tablet, Take 3 tablets (1,500 mg total) by mouth at bedtime., Disp: 270 tablet, Rfl: 1   hydrochlorothiazide (HYDRODIURIL) 25 MG tablet, Take 25 mg by mouth daily. (Patient not taking: Reported on 08/26/2021), Disp: , Rfl:    lamoTRIgine (LAMICTAL) 150 MG tablet, TAKE 1 TABLET(150 MG) BY MOUTH DAILY, Disp: 90 tablet, Rfl: 1   sertraline (ZOLOFT) 100 MG tablet, TAKE 2.5 TABLETS(200 MG) BY MOUTH DAILY, Disp: 225 tablet, Rfl: 1 Medication Side Effects: none  Family Medical/ Social History: Changes? See HPI  MENTAL HEALTH EXAM:   There were no vitals taken for this visit.There is no height or weight on file to calculate BMI.  General Appearance: Casual, Well Groomed, and Obese  Eye Contact:  Good  Speech:  Clear and Coherent and Normal Rate  Volume:  Normal  Mood:  Euthymic  Affect:  Congruent  Thought Process:  Goal Directed and Descriptions of Associations: Intact  Orientation:  Full (Time, Place, and Person)  Thought Content: Logical   Suicidal Thoughts:  No  Homicidal Thoughts:  No  Memory:  WNL  Judgement:  Good  Insight:  Good  Psychomotor Activity:   Limps a little and walks slowly but otherwise  normal  Concentration:  Concentration: Good and Attention Span: Good  Recall:  Good  Fund of Knowledge: Good  Language: Good  Assets:  Desire for Improvement  ADL's:  Intact  Cognition: WNL  Prognosis:  Good     DIAGNOSES:    ICD-10-CM   1. Bipolar 1 disorder, mixed (HCC)  F31.60 CBC with Differential/Platelet    Comprehensive metabolic panel    Valproic acid level    2. Encounter for long-term (current) use of medications  Z79.899 CBC with Differential/Platelet    Comprehensive metabolic panel    Valproic acid level    3. Generalized anxiety disorder  F41.1        Receiving Psychotherapy: No   Stevphen Meuse, [facility] C.  In the past, not since September 2022   RECOMMENDATIONS:  PDMP reviewed.  Last Xanax filled 07/02/2021 I provided 30 minutes of face to face time during this encounter, including time spent before and after the visit in records review, medical decision making, counseling pertinent to today's visit, and charting.  I am glad she is doing well mentally.  Hope she recovers quickly from the physical problems. No changes in mental health medications are necessary. We again discussed to the importance of labs.  I reordered the Depakote level, along with the CBC and CMP.  She understands the need and states she will get them done sometime this week. Continue Xanax 0.5 mg, 1/2-1 p.o. twice daily as needed.   Continue Depakote ER 500 mg, 3 p.o. nightly. Continue Lamictal 150 mg, 1 p.o. nightly. Continue Zoloft 100 mg 2.5 pills daily.   Labs ordered as above. Return in 6 months.  Melony Overly, PA-C

## 2021-10-22 ENCOUNTER — Other Ambulatory Visit: Payer: Self-pay | Admitting: Physician Assistant

## 2021-10-22 ENCOUNTER — Telehealth: Payer: Self-pay | Admitting: Physician Assistant

## 2021-10-22 MED ORDER — LAMOTRIGINE 100 MG PO TABS: 100.0000 mg | ORAL_TABLET | Freq: Two times a day (BID) | ORAL | 1 refills | Status: DC

## 2021-10-22 NOTE — Telephone Encounter (Signed)
Patient said she is in a low, anxious, depression, binge eating, and crying. She questions if the season change could be contributing. She binges and then feels bad because she knows she shouldn't have and feels like she is stuck in a cycle. She said she was on a low dose of Vyvanse years ago and it was not beneficial. She has not sought counseling, but has considered seeing a nutritionist.  ? ?[facility] on [address] in [place] ?

## 2021-10-22 NOTE — Telephone Encounter (Signed)
She has been on the current dose of Lamictal for well over a year now so it may not be working as well anymore.  I recommend increasing the dose to a total of 200 mg daily.  I have sent in a new prescription for Lamictal 100 mg and she should take 1 pill twice a day.  Have her make an appointment with me in about 4 to 6 weeks.  And of course if she has any suicidal thoughts, call 26, go to [facility] or the ER. ?Thank you.

## 2021-10-22 NOTE — Telephone Encounter (Signed)
Patient notified of recommendations and that a script had been sent.  ?

## 2021-10-22 NOTE — Telephone Encounter (Signed)
Pt called at 9:10 am and said that for a week now she has been feeling bad. She said she feels very low. She would like to know what [name] wants her to do. Please call her at [phone] ?

## 2021-10-24 LAB — CBC WITH DIFFERENTIAL/PLATELET
Basophils Absolute: 0 10*3/uL (ref 0.0–0.2)
Basos: 0 %
EOS (ABSOLUTE): 0.2 10*3/uL (ref 0.0–0.4)
Eos: 2 %
Hematocrit: 42.3 % (ref 34.0–46.6)
Hemoglobin: 14.2 g/dL (ref 11.1–15.9)
Immature Grans (Abs): 0 10*3/uL (ref 0.0–0.1)
Immature Granulocytes: 0 %
Lymphocytes Absolute: 2.4 10*3/uL (ref 0.7–3.1)
Lymphs: 25 %
MCH: 30.3 pg (ref 26.6–33.0)
MCHC: 33.6 g/dL (ref 31.5–35.7)
MCV: 90 fL (ref 79–97)
Monocytes Absolute: 0.8 10*3/uL (ref 0.1–0.9)
Monocytes: 8 %
Neutrophils Absolute: 6.2 10*3/uL (ref 1.4–7.0)
Neutrophils: 65 %
Platelets: 403 10*3/uL (ref 150–450)
RBC: 4.68 x10E6/uL (ref 3.77–5.28)
RDW: 13.6 % (ref 11.7–15.4)
WBC: 9.7 10*3/uL (ref 3.4–10.8)

## 2021-10-24 LAB — COMPREHENSIVE METABOLIC PANEL
ALT: 15 IU/L (ref 0–32)
AST: 16 IU/L (ref 0–40)
Albumin/Globulin Ratio: 2.2 (ref 1.2–2.2)
Albumin: 4.9 g/dL — ABNORMAL HIGH (ref 3.8–4.8)
Alkaline Phosphatase: 63 IU/L (ref 44–121)
BUN/Creatinine Ratio: 14 (ref 9–23)
BUN: 8 mg/dL (ref 6–24)
Bilirubin Total: 0.3 mg/dL (ref 0.0–1.2)
CO2: 24 mmol/L (ref 20–29)
Calcium: 10.1 mg/dL (ref 8.7–10.2)
Chloride: 98 mmol/L (ref 96–106)
Creatinine, Ser: 0.59 mg/dL (ref 0.57–1.00)
Globulin, Total: 2.2 g/dL (ref 1.5–4.5)
Glucose: 92 mg/dL (ref 70–99)
Potassium: 4.5 mmol/L (ref 3.5–5.2)
Sodium: 138 mmol/L (ref 134–144)
Total Protein: 7.1 g/dL (ref 6.0–8.5)
eGFR: 111 mL/min/{1.73_m2} (ref >59)

## 2021-10-24 LAB — VALPROIC ACID LEVEL: Valproic Acid Lvl: 46 ug/mL — ABNORMAL LOW (ref 50–100)

## 2021-10-27 ENCOUNTER — Telehealth: Payer: Self-pay | Admitting: Physician Assistant

## 2021-10-27 NOTE — Telephone Encounter (Signed)
These have already been reviewed and forwarded to Karin Lieu, CMA to contact patient. ?

## 2021-10-27 NOTE — Telephone Encounter (Signed)
Pt called checking status on labs from 4/7. In epic for your review. Contact # [phone] ?

## 2021-10-28 ENCOUNTER — Other Ambulatory Visit: Payer: Self-pay | Admitting: Physician Assistant

## 2021-10-28 DIAGNOSIS — Z79899 Other long term (current) drug therapy: Secondary | ICD-10-CM

## 2021-10-28 DIAGNOSIS — F3160 Bipolar disorder, current episode mixed, unspecified: Secondary | ICD-10-CM

## 2021-10-28 MED ORDER — DIVALPROEX SODIUM ER 500 MG PO TB24: 2000.0000 mg | ORAL_TABLET | Freq: Every evening | ORAL | 1 refills | Status: DC

## 2022-01-27 ENCOUNTER — Ambulatory Visit (INDEPENDENT_AMBULATORY_CARE_PROVIDER_SITE_OTHER): Payer: BC Managed Care – PPO | Admitting: Physician Assistant

## 2022-01-27 ENCOUNTER — Encounter: Payer: Self-pay | Admitting: Physician Assistant

## 2022-01-27 DIAGNOSIS — F5105 Insomnia due to other mental disorder: Secondary | ICD-10-CM

## 2022-01-27 DIAGNOSIS — F319 Bipolar disorder, unspecified: Secondary | ICD-10-CM

## 2022-01-27 DIAGNOSIS — Z79899 Other long term (current) drug therapy: Secondary | ICD-10-CM

## 2022-01-27 DIAGNOSIS — F99 Mental disorder, not otherwise specified: Secondary | ICD-10-CM

## 2022-01-27 DIAGNOSIS — F411 Generalized anxiety disorder: Secondary | ICD-10-CM

## 2022-01-27 DIAGNOSIS — F3160 Bipolar disorder, current episode mixed, unspecified: Secondary | ICD-10-CM

## 2022-01-27 MED ORDER — LAMOTRIGINE 100 MG PO TABS
100.0000 mg | ORAL_TABLET | Freq: Two times a day (BID) | ORAL | 1 refills | Status: DC
Start: 2022-01-27 — End: 2022-09-16

## 2022-01-27 MED ORDER — DIVALPROEX SODIUM ER 500 MG PO TB24: 2000.0000 mg | ORAL_TABLET | Freq: Every evening | ORAL | 1 refills | Status: DC

## 2022-01-27 MED ORDER — ALPRAZOLAM 0.25 MG PO TABS: 0.2500 mg | ORAL_TABLET | Freq: Two times a day (BID) | ORAL | 5 refills | Status: DC | PRN

## 2022-01-27 MED ORDER — SERTRALINE HCL 100 MG PO TABS
ORAL_TABLET | ORAL | 1 refills | Status: DC
Start: 2022-01-27 — End: 2022-01-28

## 2022-01-27 NOTE — Progress Notes (Signed)
[facility]  Patient ID: [name],  MRN: [id number]  PCP: [name]  Date of Evaluation: 01/27/2022  time spent:30 minutes   Chief Complaint:  Chief Complaint   Anxiety; Depression; Follow-up     HISTORY/CURRENT STATUS: For routine med check.  Not sleeping well, is a side sleeper and b/c of left knee meniscal tear, it hurts and she can't rest well. Uses CPAP, just not getting enough sleep. Xanax helps but if she takes a 0.5 mg it's too much, but 0.25 mg isn't enough.  Is doing really well mentally though. Patient denies loss of interest in usual activities and is able to enjoy things.  Denies decreased energy.  Denies decreased motivation.  Work is going well, off for the summer [employer])  ADLs and personal hygiene are normal.  Appetite has not changed.  Weight is stable.  No extreme sadness, tearfulness, or feelings of hopelessness.  Denies any changes in concentration, making decisions or remembering things.  Denies suicidal or homicidal thoughts.  Patient denies increased energy with decreased need for sleep, no increased talkativeness, no racing thoughts, no impulsivity or risky behaviors, no increased spending, no increased libido, no grandiosity, no increased irritability or anger, no paranoia, and no hallucinations.  Denies dizziness, syncope, seizures, numbness, tingling, tremor, tics, unsteady gait, slurred speech, confusion.  Left knee pain.  No dystonia.   Individual Medical History/ Review of Systems: Changes? :No     Past medications for mental health diagnoses include: Zoloft, trazodone, Xanax, Prozac, Vraylar, Cymbalta, Rexulti, Wellbutrin caused anxiety, lithium caused severe nausea, Abilify, Seroquel, Adderall, Depakote, Lamictal  Allergies: Oxycodone, Sulfa antibiotics, and Tamiflu [oseltamivir phosphate]  Current Medications:  Current Outpatient Medications:    ALPRAZolam (XANAX) 0.25 MG tablet, Take 1-1.5 tablets (0.25-0.375 mg  total) by mouth 2 (two) times daily as needed for anxiety., Disp: 90 tablet, Rfl: 5   B Complex-Biotin-FA (SUPER B-100 PO), Take by mouth., Disp: , Rfl:    esomeprazole (NEXIUM) 40 MG capsule, Take 40-80 mg by mouth daily as needed (for acid reflux). , Disp: , Rfl:    hydrochlorothiazide (HYDRODIURIL) 25 MG tablet, Take 25 mg by mouth daily., Disp: , Rfl:    lisinopril (PRINIVIL,ZESTRIL) 20 MG tablet, Take 20 mg by mouth every evening. , Disp: , Rfl:    meloxicam (MOBIC) 15 MG tablet, Take 15 mg by mouth daily as needed., Disp: , Rfl:    metoprolol succinate (TOPROL-XL) 50 MG 24 hr tablet, Take 50 mg by mouth daily., Disp: , Rfl:    Multiple Vitamin (MULTIVITAMIN) tablet, Take 1 tablet by mouth daily., Disp: , Rfl:    Omega-3 Fatty Acids (OMEGA-3 FISH OIL PO), Take by mouth., Disp: , Rfl:    VITAMIN D PO, Take by mouth., Disp: , Rfl:    divalproex (DEPAKOTE ER) 500 MG 24 hr tablet, Take 4 tablets (2,000 mg total) by mouth at bedtime., Disp: 360 tablet, Rfl: 1   lamoTRIgine (LAMICTAL) 100 MG tablet, Take 1 tablet (100 mg total) by mouth 2 (two) times daily., Disp: 180 tablet, Rfl: 1   sertraline (ZOLOFT) 100 MG tablet, Take 2.5 tablets (250 mg total) by mouth daily., Disp: 225 tablet, Rfl: 1 Medication Side Effects: none  Family Medical/ Social History: Changes? See HPI  MENTAL HEALTH EXAM:   There were no vitals taken for this visit.There is no height or weight on file to calculate BMI.  General Appearance: Casual, Well Groomed, and Obese  Eye Contact:  Good  Speech:  Clear and Coherent and Normal Rate  Volume:  Normal  Mood:  Euthymic  Affect:  Congruent  Thought Process:  Goal Directed and Descriptions of Associations: Intact  Orientation:  Full (Time, Place, and Person)  Thought Content: Logical   Suicidal Thoughts:  No  Homicidal Thoughts:  No  Memory:  WNL  Judgement:  Good  Insight:  Good  Psychomotor Activity:   Limps but nl otherwise  Concentration:  Concentration: Good and  Attention Span: Good  Recall:  Good  Fund of Knowledge: Good  Language: Good  Assets:  Desire for Improvement  ADL's:  Intact  Cognition: WNL  Prognosis:  Good   10/23/2021  Depakote level 46 CMP all normal including LFTs CBC platelets 403  DIAGNOSES:    ICD-10-CM   1. Bipolar I disorder (HCC)  F31.9     2. Encounter for long-term (current) use of medications  Z79.899     3. Insomnia due to other mental disorder  F51.05    F99     4. Generalized anxiety disorder  F41.1       Receiving Psychotherapy: No   Stevphen Meuse, [facility] C.  In the past, not since September 2022   RECOMMENDATIONS:  PDMP reviewed.  Last Xanax filled 01/20/2022.   I provided 30  minutes of face to face time during this encounter, including time spent before and after the visit in records review, medical decision making, counseling pertinent to today's visit, and charting.   I'm glad to see her doing so well! Recommend changing dose of Xanax so she can control the needed dose a little more. Otherwise no changes. Sleep hygiene discussed.   Change Xanax to 0.25 mg, 1-1.5 pills bid prn anxiety or sleep. Continue Depakote ER 500 mg, 4 p.o. nightly. Continue Lamictal 100 mg bid. Continue Zoloft 100 mg 2.5 pills daily.   Return in 6 months.  Melony Overly, PA-C

## 2022-01-28 ENCOUNTER — Other Ambulatory Visit: Payer: Self-pay

## 2022-01-28 ENCOUNTER — Telehealth: Payer: Self-pay

## 2022-01-28 MED ORDER — SERTRALINE HCL 100 MG PO TABS: 250.0000 mg | ORAL_TABLET | Freq: Every day | ORAL | 1 refills | Status: DC

## 2022-01-28 NOTE — Telephone Encounter (Signed)
I got a fax from her pharmacy, I had put the dose of Zoloft wrong on the prescription, I re-sent it with correct dose and directions.  There is no message here but I am assuming this is what the message would be about.

## 2022-01-28 NOTE — Telephone Encounter (Signed)
Patient returned call to office regarding medication. States that PCP put her on a new medication and she would like to know if it will counteract with current medication prescribed by [facility]. Please rtc [phone]

## 2022-01-28 NOTE — Telephone Encounter (Signed)
Patient is asking about possible interaction with her psych meds and the Topamax and Phentermine prescribed by PCP. Suggested she call her pharmacy and ask them. They can run all her medications to check for possible interactions.

## 2022-02-22 ENCOUNTER — Ambulatory Visit: Payer: BC Managed Care – PPO | Admitting: Physician Assistant

## 2022-02-23 ENCOUNTER — Other Ambulatory Visit: Payer: Self-pay | Admitting: Physician Assistant

## 2022-03-02 ENCOUNTER — Other Ambulatory Visit: Payer: Self-pay

## 2022-03-02 ENCOUNTER — Telehealth: Payer: Self-pay | Admitting: Physician Assistant

## 2022-03-02 MED ORDER — DIVALPROEX SODIUM ER 500 MG PO TB24: 2000.0000 mg | ORAL_TABLET | Freq: Every evening | ORAL | 0 refills | Status: DC

## 2022-03-02 NOTE — Telephone Encounter (Signed)
Patient called requesting a new Rx written due to dosage increased to 4 tabs. Fill at the [facility] on [address] in [place].  Last seen 02/22/22, with a follow up scheduled for 08/04/22  Contact information # [phone]

## 2022-03-02 NOTE — Telephone Encounter (Signed)
Rx sent 

## 2022-05-12 ENCOUNTER — Other Ambulatory Visit: Payer: Self-pay | Admitting: Physician Assistant

## 2022-06-02 ENCOUNTER — Emergency Department (HOSPITAL_COMMUNITY): Payer: Self-pay

## 2022-06-02 ENCOUNTER — Encounter (HOSPITAL_COMMUNITY): Payer: Self-pay

## 2022-06-02 ENCOUNTER — Emergency Department (HOSPITAL_COMMUNITY)
Admission: EM | Admit: 2022-06-02 | Discharge: 2022-06-02 | Disposition: A | Discharge status: Home / Self Care | Payer: No Typology Code available for payment source | Attending: Emergency Medicine | Admitting: Emergency Medicine

## 2022-06-02 ENCOUNTER — Other Ambulatory Visit: Payer: Self-pay

## 2022-06-02 DIAGNOSIS — S161XXA Strain of muscle, fascia and tendon at neck level, initial encounter: Secondary | ICD-10-CM | POA: Diagnosis not present

## 2022-06-02 DIAGNOSIS — Z79899 Other long term (current) drug therapy: Secondary | ICD-10-CM | POA: Insufficient documentation

## 2022-06-02 DIAGNOSIS — S0990XA Unspecified injury of head, initial encounter: Secondary | ICD-10-CM | POA: Diagnosis not present

## 2022-06-02 DIAGNOSIS — Y990 Civilian activity done for income or pay: Secondary | ICD-10-CM | POA: Diagnosis not present

## 2022-06-02 DIAGNOSIS — W01198A Fall on same level from slipping, tripping and stumbling with subsequent striking against other object, initial encounter: Secondary | ICD-10-CM | POA: Insufficient documentation

## 2022-06-02 DIAGNOSIS — W19XXXA Unspecified fall, initial encounter: Secondary | ICD-10-CM

## 2022-06-02 DIAGNOSIS — S199XXA Unspecified injury of neck, initial encounter: Secondary | ICD-10-CM | POA: Diagnosis present

## 2022-06-02 MED ORDER — HYDROCODONE-ACETAMINOPHEN 5-325 MG PO TABS: 1.0000 | ORAL_TABLET | Freq: Four times a day (QID) | ORAL | 0 refills | Status: DC | PRN

## 2022-06-02 MED ORDER — KETOROLAC TROMETHAMINE 30 MG/ML IJ SOLN
30.0000 mg | Freq: Once | INTRAMUSCULAR | Status: AC
  Administered 2022-06-02: 30 mg via INTRAMUSCULAR
  Filled 2022-06-02: qty 1

## 2022-06-02 MED ORDER — IBUPROFEN 600 MG PO TABS: 600.0000 mg | ORAL_TABLET | Freq: Three times a day (TID) | ORAL | 0 refills | Status: DC
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
## 2022-06-30 ENCOUNTER — Ambulatory Visit: Payer: Self-pay | Admitting: Dietician

## 2022-07-16 ENCOUNTER — Encounter: Payer: Self-pay | Admitting: Physician Assistant

## 2022-07-16 ENCOUNTER — Ambulatory Visit: Payer: BC Managed Care – PPO | Admitting: Physician Assistant

## 2022-07-16 VITALS — BP 149/95

## 2022-07-16 DIAGNOSIS — Z79899 Other long term (current) drug therapy: Secondary | ICD-10-CM

## 2022-07-16 DIAGNOSIS — F319 Bipolar disorder, unspecified: Secondary | ICD-10-CM

## 2022-07-16 DIAGNOSIS — F99 Mental disorder, not otherwise specified: Secondary | ICD-10-CM

## 2022-07-16 DIAGNOSIS — F5105 Insomnia due to other mental disorder: Secondary | ICD-10-CM | POA: Diagnosis not present

## 2022-07-16 DIAGNOSIS — F900 Attention-deficit hyperactivity disorder, predominantly inattentive type: Secondary | ICD-10-CM

## 2022-07-16 DIAGNOSIS — F411 Generalized anxiety disorder: Secondary | ICD-10-CM | POA: Diagnosis not present

## 2022-07-16 MED ORDER — GUANFACINE HCL ER 1 MG PO TB24: 1.0000 mg | ORAL_TABLET | Freq: Every day | ORAL | 1 refills | Status: DC

## 2022-07-16 NOTE — Progress Notes (Signed)
[facility]  Patient ID: [name],  MRN: [id number]  PCP: [name]  Date of Evaluation: 07/16/2022  time spent:30 minutes   Chief Complaint:  Chief Complaint   Anxiety; Depression; Follow-up    HISTORY/CURRENT STATUS: For routine med check.  Wants to discuss ADHD.  We treated her for that in the past with Adderall but it did not help.  She has a hard time staying on task, forgets things a lot, feels like she has "brain fog".  It got worse this school year, no known reason.  She feels like she is dropping the ball a lot at school.  Patient is able to enjoy things.  Energy and motivation are good.   No extreme sadness, tearfulness, or feelings of hopelessness.  Sleeps well but does have a hard time falling asleep because she cannot get her mind to shut off.  Takes the Xanax for that issue, not so much anxiety.  She never takes 1 during the day.  No panic attacks.  ADLs and personal hygiene are normal.  Appetite has not changed.  Weight is stable.  Not crying easily.  Denies suicidal or homicidal thoughts.  Patient denies increased energy with decreased need for sleep, increased talkativeness, racing thoughts, impulsivity or risky behaviors, increased spending, increased libido, grandiosity, increased irritability or anger, paranoia, or hallucinations.  Review of Systems  Constitutional:  Positive for malaise/fatigue.       Feels tired a lot  HENT: Negative.    Eyes: Negative.   Respiratory: Negative.    Cardiovascular: Negative.   Gastrointestinal: Negative.   Genitourinary: Negative.   Musculoskeletal: Negative.   Skin: Negative.   Neurological: Negative.   Endo/Heme/Allergies: Negative.   Psychiatric/Behavioral:         See HPI   Individual Medical History/ Review of Systems: Changes? :No     Past medications for mental health diagnoses include: Zoloft, trazodone, Xanax, Prozac, Vraylar, Cymbalta, Rexulti, Wellbutrin caused anxiety, lithium  caused severe nausea, Abilify, Seroquel, Adderall, Depakote, Lamictal  Allergies: Oxycodone, Sulfa antibiotics, and Tamiflu [oseltamivir phosphate]  Current Medications:  Current Outpatient Medications:    ALPRAZolam (XANAX) 0.25 MG tablet, Take 1-1.5 tablets (0.25-0.375 mg total) by mouth 2 (two) times daily as needed for anxiety., Disp: 90 tablet, Rfl: 5   B Complex-Biotin-FA (SUPER B-100 PO), Take by mouth., Disp: , Rfl:    divalproex (DEPAKOTE ER) 500 MG 24 hr tablet, Take 4 tablets (2,000 mg total) by mouth at bedtime., Disp: 360 tablet, Rfl: 0   esomeprazole (NEXIUM) 40 MG capsule, Take 40-80 mg by mouth daily as needed (for acid reflux). , Disp: , Rfl:    guanFACINE (INTUNIV) 1 MG TB24 ER tablet, Take 1 tablet (1 mg total) by mouth daily., Disp: 30 tablet, Rfl: 1   hydrochlorothiazide (HYDRODIURIL) 25 MG tablet, Take 25 mg by mouth daily., Disp: , Rfl:    ibuprofen (ADVIL) 600 MG tablet, Take 1 tablet (600 mg total) by mouth 3 (three) times daily., Disp: 15 tablet, Rfl: 0   lamoTRIgine (LAMICTAL) 100 MG tablet, Take 1 tablet (100 mg total) by mouth 2 (two) times daily., Disp: 180 tablet, Rfl: 1   lisinopril (PRINIVIL,ZESTRIL) 20 MG tablet, Take 20 mg by mouth every evening. , Disp: , Rfl:    metoprolol succinate (TOPROL-XL) 50 MG 24 hr tablet, Take 50 mg by mouth daily., Disp: , Rfl:    Multiple Vitamin (MULTIVITAMIN) tablet, Take 1 tablet by mouth daily., Disp: , Rfl:  Omega-3 Fatty Acids (OMEGA-3 FISH OIL PO), Take by mouth., Disp: , Rfl:    sertraline (ZOLOFT) 100 MG tablet, TAKE 2 AND 1/2 TABLETS(250 MG) BY MOUTH DAILY, Disp: 225 tablet, Rfl: 1   VITAMIN D PO, Take by mouth., Disp: , Rfl:    HYDROcodone-acetaminophen (NORCO/VICODIN) 5-325 MG tablet, Take 1 tablet by mouth every 6 (six) hours as needed. (Patient not taking: Reported on 07/16/2022), Disp: 12 tablet, Rfl: 0   meloxicam (MOBIC) 15 MG tablet, Take 15 mg by mouth daily as needed. (Patient not taking: Reported on  07/16/2022), Disp: , Rfl:  Medication Side Effects: none  Family Medical/ Social History: Changes? See HPI  MENTAL HEALTH EXAM:   Blood pressure (!) 149/95.There is no height or weight on file to calculate BMI.  General Appearance: Casual, Well Groomed, and Obese  Eye Contact:  Good  Speech:  Clear and Coherent and Normal Rate  Volume:  Normal  Mood:  Euthymic  Affect:  Congruent  Thought Process:  Goal Directed and Descriptions of Associations: Intact  Orientation:  Full (Time, Place, and Person)  Thought Content: Logical   Suicidal Thoughts:  No  Homicidal Thoughts:  No  Memory:  WNL  Judgement:  Good  Insight:  Good  Psychomotor Activity:  Normal  Concentration:  Concentration: Fair and Attention Span: Fair  Recall:  Good  Fund of Knowledge: Good  Language: Good  Assets:  Desire for Improvement  ADL's:  Intact  Cognition: WNL  Prognosis:  Good   DIAGNOSES:    ICD-10-CM   1. Bipolar I disorder (HCC)  F31.9 CBC with Differential/Platelet    Comprehensive metabolic panel    Valproic acid level    Lamotrigine level    TSH    Ammonia    2. Generalized anxiety disorder  F41.1     3. Attention deficit hyperactivity disorder (ADHD), predominantly inattentive type  F90.0     4. Insomnia due to other mental disorder  F51.05    F99     5. Encounter for long-term (current) use of medications  Z79.899 CBC with Differential/Platelet    Comprehensive metabolic panel    Valproic acid level    Lamotrigine level    TSH    Ammonia     Receiving Psychotherapy: No   Stevphen Meuse, [facility]  RECOMMENDATIONS:  PDMP reviewed.  Last Xanax filled 06/09/2022. I provided 30 minutes of face to face time during this encounter, including time spent before and after the visit in records review, medical decision making, counseling pertinent to today's visit, and charting.   Discussed different options for ADHD.  Stimulants were discussed, if we decide to go that route I would choose  from the methylphenidate group.  The Adderall was not effective and she prefers something different.  Also discussed Intuniv, Kapvay, Strattera or Quelbree.  Benefits, risks, and side effects were discussed of each of these.  At this point I do not want to start a stimulant or Strattera or Quelbree, I want to check her labs first to make sure the Depakote and Lamictal are in good range, also test for ammonia and then any metabolic issues.  She will have the labs drawn in the next week. We agree to start the Intuniv, orthostatic hypotension was discussed.  She will go ahead and start that now.  Change Xanax to 0.25 mg, 1-1.5 pills bid prn anxiety or sleep. Continue Depakote ER 500 mg, 4 p.o. nightly. Start Intuniv 1 mg, 1 p.o. daily. Continue Lamictal  100 mg bid. Continue Zoloft 100 mg 2.5 pills daily.   Labs ordered as noted above. Return in 4 weeks.  Melony Overly, PA-C

## 2022-07-21 ENCOUNTER — Encounter: Payer: Self-pay | Admitting: Physician Assistant

## 2022-07-22 ENCOUNTER — Telehealth: Payer: Self-pay | Admitting: Physician Assistant

## 2022-07-22 LAB — CBC WITH DIFFERENTIAL/PLATELET
Basophils Absolute: 0 10*3/uL (ref 0.0–0.2)
Basos: 1 %
EOS (ABSOLUTE): 0.4 10*3/uL (ref 0.0–0.4)
Eos: 6 %
Hematocrit: 40.9 % (ref 34.0–46.6)
Hemoglobin: 13.6 g/dL (ref 11.1–15.9)
Immature Grans (Abs): 0 10*3/uL (ref 0.0–0.1)
Immature Granulocytes: 0 %
Lymphocytes Absolute: 2.9 10*3/uL (ref 0.7–3.1)
Lymphs: 39 %
MCH: 30 pg (ref 26.6–33.0)
MCHC: 33.3 g/dL (ref 31.5–35.7)
MCV: 90 fL (ref 79–97)
Monocytes Absolute: 0.6 10*3/uL (ref 0.1–0.9)
Monocytes: 8 %
Neutrophils Absolute: 3.5 10*3/uL (ref 1.4–7.0)
Neutrophils: 46 %
Platelets: 316 10*3/uL (ref 150–450)
RBC: 4.54 x10E6/uL (ref 3.77–5.28)
RDW: 12.8 % (ref 11.7–15.4)
WBC: 7.5 10*3/uL (ref 3.4–10.8)

## 2022-07-22 LAB — COMPREHENSIVE METABOLIC PANEL
ALT: 14 IU/L (ref 0–32)
AST: 19 IU/L (ref 0–40)
Albumin/Globulin Ratio: 1.7 (ref 1.2–2.2)
Albumin: 4.5 g/dL (ref 3.9–4.9)
Alkaline Phosphatase: 51 IU/L (ref 44–121)
BUN/Creatinine Ratio: 16 (ref 9–23)
BUN: 12 mg/dL (ref 6–24)
Bilirubin Total: 0.2 mg/dL (ref 0.0–1.2)
CO2: 26 mmol/L (ref 20–29)
Calcium: 9.9 mg/dL (ref 8.7–10.2)
Chloride: 94 mmol/L — ABNORMAL LOW (ref 96–106)
Creatinine, Ser: 0.74 mg/dL (ref 0.57–1.00)
Globulin, Total: 2.7 g/dL (ref 1.5–4.5)
Glucose: 112 mg/dL — ABNORMAL HIGH (ref 70–99)
Potassium: 3.6 mmol/L (ref 3.5–5.2)
Sodium: 138 mmol/L (ref 134–144)
Total Protein: 7.2 g/dL (ref 6.0–8.5)
eGFR: 99 mL/min/{1.73_m2} (ref >59)

## 2022-07-22 LAB — LAMOTRIGINE LEVEL: Lamotrigine Lvl: 5.2 ug/mL (ref 2.0–20.0)

## 2022-07-22 LAB — TSH: TSH: 4.2 u[IU]/mL (ref 0.450–4.500)

## 2022-07-22 LAB — AMMONIA: Ammonia: 37 ug/dL (ref 31–155)

## 2022-07-22 LAB — VALPROIC ACID LEVEL: Valproic Acid Lvl: 69 ug/mL (ref 50–100)

## 2022-07-22 NOTE — Telephone Encounter (Signed)
Pt called and said that the new medicine guanfacine is not working at all. Please call her about lab results as well as the medication problem. Her number is [phone]

## 2022-07-22 NOTE — Progress Notes (Signed)
Pt informed

## 2022-07-22 NOTE — Telephone Encounter (Signed)
Yes, at least 2 weeks.

## 2022-07-22 NOTE — Telephone Encounter (Signed)
Pt stated she has been on guanfacine for a week and it has not helped with ADHD at all.Should she give it more time?

## 2022-07-22 NOTE — Progress Notes (Signed)
Please let her know labs are good. CBC nl, Glu barely high at 112. Kidney and liver functions nl, Depakote level 69 which is good, Lamictal level 5.2 good, TSH 4.2 nl. Ammonia level in normal range.  No change in treatment. Thanks

## 2022-07-23 NOTE — Progress Notes (Signed)
[name] said she called patient.

## 2022-07-23 NOTE — Telephone Encounter (Signed)
Pt informed

## 2022-07-28 ENCOUNTER — Telehealth: Payer: Self-pay | Admitting: Pulmonary Disease

## 2022-07-28 ENCOUNTER — Ambulatory Visit: Payer: Self-pay | Admitting: Dietician

## 2022-07-28 DIAGNOSIS — G4733 Obstructive sleep apnea (adult) (pediatric): Secondary | ICD-10-CM

## 2022-07-28 NOTE — Telephone Encounter (Signed)
PT not seen in the past year. Can she see someone in [place] if she does need appt?

## 2022-07-28 NOTE — Telephone Encounter (Signed)
Dr. Ander Slade is it okay if patient sees a provider in [place] please advise?

## 2022-07-28 NOTE — Telephone Encounter (Signed)
PT has a tear in her CPAP mask. [employer] in [place] said she needed a RX from [place] in order to replace. Pls call to advise.

## 2022-07-30 ENCOUNTER — Telehealth: Payer: Self-pay | Admitting: Pulmonary Disease

## 2022-07-30 NOTE — Telephone Encounter (Signed)
Called patient but she did not answer. Left message for her to call back. Order was sent this morning for supplies.

## 2022-07-30 NOTE — Telephone Encounter (Signed)
Called and spoke with patient. She verbalized understanding. RX for cpap supplies has been placed.   I did attempt to get her scheduled to see Dr. Halford Chessman in [place] but she wanted to call back for an appt since she was at another docs appt. She is aware to call us back when she is ready.   Nothing further needed at time of call.

## 2022-07-30 NOTE — Telephone Encounter (Signed)
See closed Tel encounter.   [facility] has no CPAP RX on file. Pt wants to be sure it is sent. Pls call PT to advise. TY.  [phone]

## 2022-07-30 NOTE — Telephone Encounter (Signed)
Okay to send in for CPAP supplies  Okay to schedule with Dr. Halford Chessman in [place]

## 2022-08-04 ENCOUNTER — Other Ambulatory Visit: Payer: Self-pay | Admitting: Physician Assistant

## 2022-08-04 ENCOUNTER — Telehealth (INDEPENDENT_AMBULATORY_CARE_PROVIDER_SITE_OTHER): Payer: BC Managed Care – PPO | Admitting: Physician Assistant

## 2022-08-04 ENCOUNTER — Encounter: Payer: Self-pay | Admitting: Physician Assistant

## 2022-08-04 DIAGNOSIS — F900 Attention-deficit hyperactivity disorder, predominantly inattentive type: Secondary | ICD-10-CM | POA: Diagnosis not present

## 2022-08-04 DIAGNOSIS — F411 Generalized anxiety disorder: Secondary | ICD-10-CM

## 2022-08-04 DIAGNOSIS — F319 Bipolar disorder, unspecified: Secondary | ICD-10-CM

## 2022-08-04 DIAGNOSIS — F5105 Insomnia due to other mental disorder: Secondary | ICD-10-CM | POA: Diagnosis not present

## 2022-08-04 MED ORDER — METHYLPHENIDATE HCL ER (LA) 10 MG PO CP24: 10.0000 mg | ORAL_CAPSULE | Freq: Every morning | ORAL | 0 refills | Status: DC

## 2022-08-04 NOTE — Progress Notes (Signed)
[facility]  Patient ID: [name],  MRN: [id number]  PCP: Ginger Organ  Date of Evaluation: 08/04/2022 time spent:30 minutes   Chief Complaint:  Chief Complaint   Depression; ADHD; Follow-up   Virtual Visit via Telehealth  I connected with patient by a video enabled telemedicine application with their informed consent, and verified patient privacy and that I am speaking with the correct person using two identifiers.  I am private, in my office and the patient is at home.  I discussed the limitations, risks, security and privacy concerns of performing an evaluation and management service by telephone video and the availability of in person appointments. I also discussed with the patient that there may be a patient responsible charge related to this service. The patient expressed understanding and agreed to proceed.   I discussed the assessment and treatment plan with the patient. The patient was provided an opportunity to ask questions and all were answered. The patient agreed with the plan and demonstrated an understanding of the instructions.   The patient was advised to call back or seek an in-person evaluation if the symptoms worsen or if the condition fails to improve as anticipated.  I provided 30 minutes of non-face-to-face time during this encounter.  HISTORY/CURRENT STATUS: For routine med check.  Intuniv was started last month.  States she has not noticed any improvement at all in focus and attention.  No side effects from it either though.  Patient is able to enjoy things.  Energy and motivation are good.  She has COVID right now so not feeling her best.  Is at home sick of course.  Work is going well.   No extreme sadness, tearfulness, or feelings of hopelessness.  Sleeps well most of the time. ADLs and personal hygiene are normal.   Appetite has not changed.  Weight is stable.  Has anxiety occasionally.  Xanax helps but she rarely needs it.   Denies suicidal or homicidal thoughts.  Patient denies increased energy with decreased need for sleep, increased talkativeness, racing thoughts, impulsivity or risky behaviors, increased spending, increased libido, grandiosity, increased irritability or anger, paranoia, or hallucinations.  Denies dizziness, syncope, seizures, numbness, tingling, tremor, tics, unsteady gait, slurred speech, confusion. Denies muscle or joint pain, stiffness, or dystonia.   Individual Medical History/ Review of Systems: Changes? :Yes    has covid right now  Past medications for mental health diagnoses include: Zoloft, trazodone, Xanax, Prozac, Vraylar, Cymbalta, Rexulti, Wellbutrin caused anxiety, lithium caused severe nausea, Abilify, Seroquel, Adderall, Depakote, Lamictal, Vyvanse, Intuniv was not effective  Allergies: Oxycodone, Sulfa antibiotics, and Tamiflu [oseltamivir phosphate]  Current Medications:  Current Outpatient Medications:    ALPRAZolam (XANAX) 0.25 MG tablet, Take 1-1.5 tablets (0.25-0.375 mg total) by mouth 2 (two) times daily as needed for anxiety., Disp: 90 tablet, Rfl: 5   B Complex-Biotin-FA (SUPER B-100 PO), Take by mouth., Disp: , Rfl:    divalproex (DEPAKOTE ER) 500 MG 24 hr tablet, Take 4 tablets (2,000 mg total) by mouth at bedtime., Disp: 360 tablet, Rfl: 0   esomeprazole (NEXIUM) 40 MG capsule, Take 40-80 mg by mouth daily as needed (for acid reflux). , Disp: , Rfl:    hydrochlorothiazide (HYDRODIURIL) 25 MG tablet, Take 25 mg by mouth daily., Disp: , Rfl:    lamoTRIgine (LAMICTAL) 100 MG tablet, Take 1 tablet (100 mg total) by mouth 2 (two) times daily., Disp: 180 tablet, Rfl: 1   lisinopril (PRINIVIL,ZESTRIL) 20 MG tablet, Take 20 mg by  mouth every evening. , Disp: , Rfl:    methylphenidate (RITALIN LA) 10 MG 24 hr capsule, Take 1 capsule (10 mg total) by mouth in the morning., Disp: 30 capsule, Rfl: 0   metoprolol succinate (TOPROL-XL) 50 MG 24 hr tablet, Take 50 mg by mouth  daily., Disp: , Rfl:    Multiple Vitamin (MULTIVITAMIN) tablet, Take 1 tablet by mouth daily., Disp: , Rfl:    Omega-3 Fatty Acids (OMEGA-3 FISH OIL PO), Take by mouth., Disp: , Rfl:    sertraline (ZOLOFT) 100 MG tablet, TAKE 2 AND 1/2 TABLETS(250 MG) BY MOUTH DAILY, Disp: 225 tablet, Rfl: 1   VITAMIN D PO, Take by mouth., Disp: , Rfl:    HYDROcodone-acetaminophen (NORCO/VICODIN) 5-325 MG tablet, Take 1 tablet by mouth every 6 (six) hours as needed. (Patient not taking: Reported on 07/16/2022), Disp: 12 tablet, Rfl: 0   ibuprofen (ADVIL) 600 MG tablet, Take 1 tablet (600 mg total) by mouth 3 (three) times daily. (Patient not taking: Reported on 08/04/2022), Disp: 15 tablet, Rfl: 0   meloxicam (MOBIC) 15 MG tablet, Take 15 mg by mouth daily as needed. (Patient not taking: Reported on 07/16/2022), Disp: , Rfl:  Medication Side Effects: none  Family Medical/ Social History: Changes? See HPI  MENTAL HEALTH EXAM:   There were no vitals taken for this visit.There is no height or weight on file to calculate BMI.  General Appearance:  unable to assess  Eye Contact:   unable to assess  Speech:  Clear and Coherent and Normal Rate  Volume:  Normal  Mood:  Euthymic  Affect:   unable to assess  Thought Process:  Goal Directed and Descriptions of Associations: Intact  Orientation:  Full (Time, Place, and Person)  Thought Content: Logical   Suicidal Thoughts:  No  Homicidal Thoughts:  No  Memory:  WNL  Judgement:  Good  Insight:  Good  Psychomotor Activity:  Normal  Concentration:  Concentration: Fair and Attention Span: Fair  Recall:  Good  Fund of Knowledge: Good  Language: Good  Assets:  Desire for Improvement  ADL's:  Intact  Cognition: WNL  Prognosis:  Good   Labs 07/20/2022 CBC with differential normal CMP glucose 112, LFTs, kidney functions all normal Depakote level 69 Lamictal level 5.2 TSH 4.2 Ammonia 37  DIAGNOSES:    ICD-10-CM   1. Attention deficit hyperactivity disorder  (ADHD), predominantly inattentive type  F90.0     2. Bipolar I disorder ([place])  F31.9     3. Generalized anxiety disorder  F41.1     4. Insomnia due to other mental disorder  F51.05    F99       Receiving Psychotherapy: No   [name], [place]  RECOMMENDATIONS:  PDMP reviewed.  Last Xanax filled 06/09/2022.  I provided 30 mins non-face to face time during this encounter, including time spent before and after the visit in records review, medical decision making, counseling pertinent to today's visit, and charting.   She has not responded to guanfacine and I do not feel that increasing the dose will be beneficial.  Plus she is on lisinopril and metoprolol already for blood pressure and I do not want to cause hypotension.  She has taken Adderall and Vyvanse in the past but neither really helped.  Because the bipolar disorder is well treated and has been for quite a while, I feel comfortable giving her a different stimulant.  She knows to watch for mania, and also check her blood pressure  once a week or so to make sure it is not elevated.  She will also let her husband know to watch for signs of mania.  Benefits, risk and side effects of stimulants were discussed.  She knows not to take the Xanax very often as 1 is an upper and one is a downer.  Change Xanax to 0.25 mg, 1-1.5 pills bid prn anxiety or sleep. Continue Depakote ER 500 mg, 4 p.o. nightly. Continue Lamictal 100 mg bid. Start Ritalin LA 10 mg, 1 p.o. every morning. Continue Zoloft 100 mg 2.5 pills daily.   Return in 4 weeks.  Melony Overly, PA-C

## 2022-08-06 ENCOUNTER — Other Ambulatory Visit: Payer: Self-pay

## 2022-08-09 ENCOUNTER — Telehealth: Payer: Self-pay

## 2022-08-09 MED ORDER — ALPRAZOLAM 0.25 MG PO TABS: ORAL_TABLET | ORAL | 0 refills | Status: DC

## 2022-08-09 NOTE — Telephone Encounter (Addendum)
Prior Authorization Methylphenidate HCl ER (LA) 10MG  er capsules #30 [employer]  Approved Effective:  08/10/2022 - 08/09/2025

## 2022-08-16 ENCOUNTER — Other Ambulatory Visit: Payer: Self-pay

## 2022-08-16 ENCOUNTER — Telehealth: Payer: Self-pay | Admitting: Physician Assistant

## 2022-08-16 NOTE — Telephone Encounter (Signed)
Pended to [facility] on [address], not [facility].

## 2022-08-16 NOTE — Telephone Encounter (Signed)
Called pt at 1:15p to schedule follow up appt.  She advised she has not been able to find the Methylphenidate prescribed on 1/17.  She has contacted [facility] and none [facility]. I saw [facility] approved the PA and asked her if she wanted [place] to try to send it there for mail orders.  She wasn't familiar with them, other than they were associated [facility]. She would like to have the script sent to [facility].  She asks that someone call her back to let he know if that can and will be done.  Next appt 2/29

## 2022-08-17 MED ORDER — METHYLPHENIDATE HCL ER (LA) 10 MG PO CP24: 10.0000 mg | ORAL_CAPSULE | Freq: Every morning | ORAL | 0 refills | Status: DC

## 2022-08-18 ENCOUNTER — Ambulatory Visit: Payer: Self-pay | Admitting: Dietician

## 2022-09-13 ENCOUNTER — Other Ambulatory Visit: Payer: Self-pay | Admitting: Physician Assistant

## 2022-09-16 ENCOUNTER — Telehealth (INDEPENDENT_AMBULATORY_CARE_PROVIDER_SITE_OTHER): Payer: BC Managed Care – PPO | Admitting: Physician Assistant

## 2022-09-16 ENCOUNTER — Institutional Professional Consult (permissible substitution) (HOSPITAL_BASED_OUTPATIENT_CLINIC_OR_DEPARTMENT_OTHER): Payer: Worker's Compensation | Admitting: Pulmonary Disease

## 2022-09-16 ENCOUNTER — Telehealth: Payer: Self-pay

## 2022-09-16 ENCOUNTER — Encounter: Payer: Self-pay | Admitting: Physician Assistant

## 2022-09-16 DIAGNOSIS — F900 Attention-deficit hyperactivity disorder, predominantly inattentive type: Secondary | ICD-10-CM

## 2022-09-16 DIAGNOSIS — F5105 Insomnia due to other mental disorder: Secondary | ICD-10-CM | POA: Diagnosis not present

## 2022-09-16 DIAGNOSIS — F411 Generalized anxiety disorder: Secondary | ICD-10-CM | POA: Diagnosis not present

## 2022-09-16 DIAGNOSIS — F319 Bipolar disorder, unspecified: Secondary | ICD-10-CM | POA: Diagnosis not present

## 2022-09-16 DIAGNOSIS — F99 Mental disorder, not otherwise specified: Secondary | ICD-10-CM

## 2022-09-16 MED ORDER — METHYLPHENIDATE HCL ER (LA) 20 MG PO CP24: 20.0000 mg | ORAL_CAPSULE | ORAL | 0 refills | Status: DC

## 2022-09-16 MED ORDER — LAMOTRIGINE 100 MG PO TABS: 100.0000 mg | ORAL_TABLET | Freq: Two times a day (BID) | ORAL | 1 refills | Status: DC

## 2022-09-16 NOTE — Progress Notes (Signed)
[facility]  Patient ID: [name],  MRN: [id number]  PCP: Ginger Organ  Date of Evaluation: 09/16/2022 time spent:20 minutes   Chief Complaint:  Chief Complaint   ADD; Depression; Anxiety; Follow-up   Virtual Visit via Telehealth  I connected with patient by a video enabled telemedicine application with their informed consent, and verified patient privacy and that I am speaking with the correct person using two identifiers.  I am private, in my office and the patient is at home.  I discussed the limitations, risks, security and privacy concerns of performing an evaluation and management service by  video and the availability of in person appointments. I also discussed with the patient that there may be a patient responsible charge related to this service. The patient expressed understanding and agreed to proceed.   I discussed the assessment and treatment plan with the patient. The patient was provided an opportunity to ask questions and all were answered. The patient agreed with the plan and demonstrated an understanding of the instructions.   The patient was advised to call back or seek an in-person evaluation if the symptoms worsen or if the condition fails to improve as anticipated.  I provided 20 minutes of non-face-to-face time during this encounter.  HISTORY/CURRENT STATUS: For routine med check.  Ritalin was started last month. It helped a little for the first few days, but then pooped out. Wonders if increasing the dose would help. Has a hard time staying on task, doesn't have a lot of energy. Work is going ok. Patient is able to enjoy things.  No extreme sadness, tearfulness, or feelings of hopelessness.   ADLs and personal hygiene are normal.   Appetite has not changed.  Weight is stable. States she binge eats a lot though. Denies suicidal or homicidal thoughts.  She's concerned about the possibility of depression hitting this spring, like  last year. Would like to ward it off the pass before it hits if possible. Last year we increased the Lamictal which helped.   Only gets about 6 hours of sleep. Not a new problem though. Melatonin doesn't help much. Sometimes takes the Xanax just to help relax her and she can go to sleep easier. Still has early morning awakening a lot of the time. Doesn't take the Xanax during the day very often.   Patient denies increased energy with decreased need for sleep, increased talkativeness, racing thoughts, impulsivity or risky behaviors, increased spending, increased libido, grandiosity, increased irritability or anger, paranoia, or hallucinations.  Denies dizziness, syncope, seizures, numbness, tingling, tremor, tics, unsteady gait, slurred speech, confusion. Denies muscle or joint pain, stiffness, or dystonia.  Individual Medical History/ Review of Systems: Changes? :No     Past medications for mental health diagnoses include: Zoloft, trazodone, Xanax, Prozac, Vraylar, Cymbalta, Rexulti, Wellbutrin caused anxiety, lithium caused severe nausea, Abilify, Seroquel, Adderall, Depakote, Lamictal, Vyvanse, Intuniv was not effective  Allergies: Oxycodone, Sulfa antibiotics, and Tamiflu [oseltamivir phosphate]  Current Medications:  Current Outpatient Medications:    ALPRAZolam (XANAX) 0.25 MG tablet, TAKE 1 TO 1 AND 1/2 TABLETS(0.25 TO 0.375 MG) BY MOUTH TWICE DAILY AS NEEDED FOR ANXIETY, Disp: 90 tablet, Rfl: 0   B Complex-Biotin-FA (SUPER B-100 PO), Take by mouth., Disp: , Rfl:    divalproex (DEPAKOTE ER) 500 MG 24 hr tablet, TAKE 4 TABLETS(2000 MG) BY MOUTH AT BEDTIME, Disp: 360 tablet, Rfl: 0   esomeprazole (NEXIUM) 40 MG capsule, Take 40-80 mg by mouth daily as needed (for acid  reflux). , Disp: , Rfl:    hydrochlorothiazide (HYDRODIURIL) 25 MG tablet, Take 25 mg by mouth daily., Disp: , Rfl:    lisinopril (PRINIVIL,ZESTRIL) 20 MG tablet, Take 20 mg by mouth every evening. , Disp: , Rfl:     methylphenidate (RITALIN LA) 20 MG 24 hr capsule, Take 1 capsule (20 mg total) by mouth every morning., Disp: 30 capsule, Rfl: 0   metoprolol succinate (TOPROL-XL) 50 MG 24 hr tablet, Take 50 mg by mouth daily., Disp: , Rfl:    Multiple Vitamin (MULTIVITAMIN) tablet, Take 1 tablet by mouth daily., Disp: , Rfl:    Omega-3 Fatty Acids (OMEGA-3 FISH OIL PO), Take by mouth., Disp: , Rfl:    sertraline (ZOLOFT) 100 MG tablet, TAKE 2 AND 1/2 TABLETS(250 MG) BY MOUTH DAILY, Disp: 225 tablet, Rfl: 1   VITAMIN D PO, Take by mouth., Disp: , Rfl:    HYDROcodone-acetaminophen (NORCO/VICODIN) 5-325 MG tablet, Take 1 tablet by mouth every 6 (six) hours as needed. (Patient not taking: Reported on 07/16/2022), Disp: 12 tablet, Rfl: 0   ibuprofen (ADVIL) 600 MG tablet, Take 1 tablet (600 mg total) by mouth 3 (three) times daily. (Patient not taking: Reported on 09/16/2022), Disp: 15 tablet, Rfl: 0   lamoTRIgine (LAMICTAL) 100 MG tablet, Take 1 tablet (100 mg total) by mouth 2 (two) times daily., Disp: 180 tablet, Rfl: 1   meloxicam (MOBIC) 15 MG tablet, Take 15 mg by mouth daily as needed. (Patient not taking: Reported on 07/16/2022), Disp: , Rfl:  Medication Side Effects: none  Family Medical/ Social History: Changes? See HPI  MENTAL HEALTH EXAM:   There were no vitals taken for this visit.There is no height or weight on file to calculate BMI.  General Appearance: Casual  Eye Contact:  Good  Speech:  Clear and Coherent and Normal Rate  Volume:  Normal  Mood:  Euthymic  Affect:  Congruent  Thought Process:  Goal Directed and Descriptions of Associations: Intact  Orientation:  Full (Time, Place, and Person)  Thought Content: Logical   Suicidal Thoughts:  No  Homicidal Thoughts:  No  Memory:  WNL  Judgement:  Good  Insight:  Good  Psychomotor Activity:  Normal  Concentration:  Concentration: Fair and Attention Span: Fair  Recall:  Good  Fund of Knowledge: Good  Language: Good  Assets:  Desire for  Improvement  ADL's:  Intact  Cognition: WNL  Prognosis:  Good   DIAGNOSES:    ICD-10-CM   1. Attention deficit hyperactivity disorder (ADHD), predominantly inattentive type  F90.0     2. Bipolar I disorder ([place])  F31.9     3. Generalized anxiety disorder  F41.1     4. Insomnia due to other mental disorder  F51.05    F99       Receiving Psychotherapy: No   [name], [place]  RECOMMENDATIONS:  PDMP reviewed.  Last Xanax filled 06/09/2022.  I provided 20 mins non-face to face time during this encounter, including time spent before and after the visit in records review, medical decision making, counseling pertinent to today's visit, and charting.   Recommend increasing Ritalin. Hopefully it'll help focus, binge eating, and can sometimes help depression too. We briefly discussed adding Elavil at low dose which will help sleep and depression possibly, even at low doses. But can cause wt gain so we decided to avoid it for now. I don't want to add anything else or increase Zoloft to treat depression that may or may not  happen. And she's on high dose of Zoloft already, although we can increase if needed.   Change Xanax to 0.25 mg, 1-1.5 pills bid prn anxiety or sleep. Continue Depakote ER 500 mg, 4 p.o. nightly. Continue Lamictal 100 mg bid. Increase Ritalin LA 20 mg, 1 p.o. every morning. Continue Zoloft 100 mg 2.5 pills daily.   Return in 4 weeks.  Donnal Moat, PA-C

## 2022-09-21 NOTE — Telephone Encounter (Signed)
Prior Authorization Methylphenidate ER 30 #30/30 [facility]  Approved Effective:  09/19/22-09/18/25

## 2022-09-30 ENCOUNTER — Encounter (HOSPITAL_BASED_OUTPATIENT_CLINIC_OR_DEPARTMENT_OTHER): Payer: Self-pay | Admitting: Pulmonary Disease

## 2022-09-30 ENCOUNTER — Ambulatory Visit (HOSPITAL_BASED_OUTPATIENT_CLINIC_OR_DEPARTMENT_OTHER): Payer: BC Managed Care – PPO | Admitting: Pulmonary Disease

## 2022-09-30 VITALS — BP 130/74 | HR 77 | Ht 63.0 in | Wt 273.9 lb

## 2022-09-30 DIAGNOSIS — G4710 Hypersomnia, unspecified: Secondary | ICD-10-CM

## 2022-09-30 DIAGNOSIS — G4733 Obstructive sleep apnea (adult) (pediatric): Secondary | ICD-10-CM

## 2022-09-30 NOTE — Progress Notes (Signed)
Subjective:    Patient ID: [name], female    DOB: June 22, 1971, 52 y.o.   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 She reports hypersomnolence starting from her 10s.  She underwent sleep study evaluation in 2017 and was found to have very mild OSA with AHI 5.5/hour.  She was started on CPAP therapy with a fullface mask and initially felt improved but somnolence has returned and she wonders if her machine is not working well she is upset that there is no ST card in her machine.  I was able to review data from app on her phone.  She has a ResMed AutoSet 10 and compliance is sporadic on certain nights up to 6 hours per night with a few missed nights. Epworth sleepiness score is 17 and she reports sleepiness while watching TV sitting and reading, as a passenger in a car or lying down to rest in the afternoon.  She will often nap after coming back from work.  On weekends she will take a 2-hour nap and naps are refreshing. Bedtime is between 10 and 11 PM sleep latency minimal, she sleeps on her side with 1 pillow, denies frequent nocturnal awakenings and is out of bed at 5 AM feeling tired with occasional headaches.  On weekends she will sleep until 7:30 AM. She has gained 40 pounds in the last few months   PMH -recent diagnosis of ADHD started on Adderall Hypertension bipolar  Significant tests/ events reviewed  HST 04/2016 - wt 241 lbs - no sig OSA 04/2016 NPSG >> AHI 5.5/h   Past Medical History:  Diagnosis Date   Anxiety    Asthma    Attention deficit hyperactivity disorder (ADHD), predominantly inattentive type 06/13/2018   Bipolar 1 disorder, mixed ([place]) 06/13/2018   Contact lens/glasses fitting    wears contacta or glasses   Depression    Fatigue    GERD (gastroesophageal reflux disease)    Headache    Hypertension    PONV (postoperative nausea and vomiting)    Tear of medial meniscus  of right knee 12/21/2013   Past Surgical History:  Procedure Laterality Date   CHOLECYSTECTOMY  2008   lap choli   DILATION AND CURETTAGE OF UTERUS     ERCP  2008   KNEE ARTHROSCOPY WITH MEDIAL MENISECTOMY Left 12/21/2013   Procedure: LEFT KNEE ARTHROSCOPY WITH MEDIAL MENISECTOMY;  Surgeon: Johnny Bridge, MD;  Location: [facility];  Service: Orthopedics;  Laterality: Left;   TONSILLECTOMY     VAGINAL HYSTERECTOMY  2012   loa    Allergies  Allergen Reactions   Oxycodone Hives   Sulfa Antibiotics Hives   Tamiflu [Oseltamivir Phosphate] Hives    Social History   Socioeconomic History   Marital status: Married    Spouse name: Scientist, physiological   Number of children: 1   Years of education: Not on file   Highest education level: Master's degree (e.g., MA, MS, MEng, MEd, MSW, MBA)  Occupational History   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  Tobacco Use   Smoking status: Never   Smokeless tobacco: Never  Vaping Use   Vaping Use: Never used  Substance and Sexual Activity   Alcohol use: Never   Drug use: Never   Sexual activity: Yes    Partners: Male    Birth control/protection: None, Surgical    Comment: married  Other Topics Concern   Not on file  Social History Narrative   Not on file   Social Determinants of Health   Financial Resource Strain: Low Risk  (05/03/2018)   Overall Financial Resource Strain (CARDIA)    Difficulty of Paying Living Expenses: Not hard at all  Food Insecurity: No Food Insecurity (05/03/2018)   Hunger Vital Sign    Worried About Running Out of Food in the Last Year: Never true    Ran Out of Food in the Last Year: Never true  Transportation Needs: No Transportation Needs (05/03/2018)   PRAPARE - Hydrologist (Medical): No    Lack of Transportation (Non-Medical): No  Physical Activity: Insufficiently Active (05/03/2018)   Exercise Vital Sign    Days of Exercise per Week: 7 days    Minutes of  Exercise per Session: 20 min  Stress: Stress Concern Present (05/03/2018)   [place]    Feeling of Stress : Very much  Social Connections: Socially Integrated (05/03/2018)   Social Connection and Isolation Panel [NHANES]    Frequency of Communication with Friends and Family: More than three times a week    Frequency of Social Gatherings with Friends and Family: Once a week    Attends Religious Services: More than 4 times per year    Active Member of [employer] or Organizations: Yes    Attends Music therapist: More than 4 times per year    Marital Status: Married  Human resources officer Violence: Not At Risk (05/03/2018)   Humiliation, Afraid, Rape, and Kick questionnaire    Fear of Current or Ex-Partner: No    Emotionally Abused: No    Physically Abused: No    Sexually Abused: No    Family History  Problem Relation Age of Onset   Depression Mother    Drug abuse Father    Tourette syndrome Father    Depression Sister    Anxiety disorder Sister    Alcohol abuse Paternal Uncle      Review of Systems Constitutional: negative for anorexia, fevers and sweats  Eyes: negative for irritation, redness and visual disturbance  Ears, nose, mouth, throat, and face: negative for earaches, epistaxis, nasal congestion and sore throat  Respiratory: negative for cough, dyspnea on exertion, sputum and wheezing  Cardiovascular: negative for chest pain, dyspnea, lower extremity edema, orthopnea, palpitations and syncope  Gastrointestinal: negative for abdominal pain, constipation, diarrhea, melena, nausea and vomiting  Genitourinary:negative for dysuria, frequency and hematuria  Hematologic/lymphatic: negative for bleeding, easy bruising and lymphadenopathy  Musculoskeletal:negative for arthralgias, muscle weakness and stiff joints  Neurological: negative for coordination problems, gait problems, headaches and weakness   Endocrine: negative for diabetic symptoms including polydipsia, polyuria and weight loss     Objective:   Physical Exam  Gen. Pleasant, obese, in no distress, normal affect ENT - no pallor,icterus, no post nasal drip, class 2-3 airway Neck: No JVD, no thyromegaly, no carotid bruits Lungs: no use of accessory muscles, no dullness to percussion, decreased without rales or rhonchi  Cardiovascular: Rhythm regular, heart sounds  normal, no murmurs or gallops, no peripheral edema Abdomen: soft and non-tender, no hepatosplenomegaly, BS normal. Musculoskeletal: No deformities, no cyanosis or clubbing Neuro:  alert, non focal, no tremors       Assessment & Plan:

## 2022-09-30 NOTE — Patient Instructions (Signed)
X DL from [facility]  X New nasal cradle mask  X Hoe sleep test

## 2022-10-01 DIAGNOSIS — G4733 Obstructive sleep apnea (adult) (pediatric): Secondary | ICD-10-CM | POA: Insufficient documentation

## 2022-10-01 NOTE — Assessment & Plan Note (Signed)
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  This is quite out of proportion to degree of sleep disordered breathing noted on sleep studies.  We may have to pursue workup for persistent hypersomnolence including MSLT once we can achieve compliance with her CPAP

## 2022-10-01 NOTE — Assessment & Plan Note (Signed)
OSA appears to have been very mild on previous studies.  She has gained 40 pounds over the last few years.  Will reassess with home sleep test.  This likely that AHI would be worse. I discussed different types of masks with her and she would prefer nasal cradle mask and we will get her started on this.  Hopeful this will improve compliance. Once we have adequate compliance on CPAP we will assess residual somnolence and proceed with an MSLT Meanwhile she has been started on Adderall for ADHD and we will see if this helps with her somnolence

## 2022-10-05 ENCOUNTER — Ambulatory Visit: Payer: Self-pay | Admitting: Family Medicine

## 2022-10-06 ENCOUNTER — Telehealth (INDEPENDENT_AMBULATORY_CARE_PROVIDER_SITE_OTHER): Payer: BC Managed Care – PPO | Admitting: Physician Assistant

## 2022-10-06 ENCOUNTER — Telehealth: Payer: Self-pay

## 2022-10-06 ENCOUNTER — Encounter: Payer: Self-pay | Admitting: Physician Assistant

## 2022-10-06 DIAGNOSIS — F319 Bipolar disorder, unspecified: Secondary | ICD-10-CM

## 2022-10-06 DIAGNOSIS — F99 Mental disorder, not otherwise specified: Secondary | ICD-10-CM

## 2022-10-06 DIAGNOSIS — F411 Generalized anxiety disorder: Secondary | ICD-10-CM | POA: Diagnosis not present

## 2022-10-06 DIAGNOSIS — F900 Attention-deficit hyperactivity disorder, predominantly inattentive type: Secondary | ICD-10-CM

## 2022-10-06 DIAGNOSIS — F5105 Insomnia due to other mental disorder: Secondary | ICD-10-CM | POA: Diagnosis not present

## 2022-10-06 MED ORDER — METHYLPHENIDATE HCL ER (LA) 40 MG PO CP24: 40.0000 mg | ORAL_CAPSULE | ORAL | 0 refills | Status: DC

## 2022-10-06 NOTE — Progress Notes (Signed)
[facility]  Patient ID: [name],  MRN: [id number]  PCP: Ginger Organ  Date of Evaluation: 10/06/2022 time spent:20 minutes   Chief Complaint:  Chief Complaint   ADHD; Anxiety; Depression; Follow-up    Virtual Visit via Telehealth  I connected with patient by a video enabled telemedicine application with their informed consent, and verified patient privacy and that I am speaking with the correct person using two identifiers.  I am private, in my office and the patient is at work.  I discussed the limitations, risks, security and privacy concerns of performing an evaluation and management service by  video and the availability of in person appointments. I also discussed with the patient that there may be a patient responsible charge related to this service. The patient expressed understanding and agreed to proceed.   I discussed the assessment and treatment plan with the patient. The patient was provided an opportunity to ask questions and all were answered. The patient agreed with the plan and demonstrated an understanding of the instructions.   The patient was advised to call back or seek an in-person evaluation if the symptoms worsen or if the condition fails to improve as anticipated.  I provided 20 minutes of non-face-to-face time during this encounter.  HISTORY/CURRENT STATUS: For routine med check.  Ritalin was increased last month.  She has not noticed any improvement in focus and concentration at all.  She still gets distracted very easily.  States it is affecting her job.  Patient is able to enjoy things.  Energy and motivation are fair.  She has felt a little "down" the past few weeks, this often happens in the spring after the time changes.  She does not feel like anything needs to be done as far as med changes though, at least not now.  Feels like there will be times that she will have down days just like every body does.  No feelings of  hopelessness. ADLs and personal hygiene are normal.   Appetite has not changed.  Weight is stable.  Denies suicidal or homicidal thoughts.  Sometimes takes the Xanax just to help relax her and she can go to sleep easier. Still has early morning awakening a lot of the time. Doesn't take the Xanax during the day very often.   Patient denies increased energy with decreased need for sleep, increased talkativeness, racing thoughts, impulsivity or risky behaviors, increased spending, increased libido, grandiosity, increased irritability or anger, paranoia, or hallucinations.  Denies dizziness, syncope, seizures, numbness, tingling, tremor, tics, unsteady gait, slurred speech, confusion. Denies muscle or joint pain, stiffness, or dystonia.  Individual Medical History/ Review of Systems: Changes? :No     Past medications for mental health diagnoses include: Zoloft, trazodone, Xanax, Prozac, Vraylar, Cymbalta, Rexulti, Wellbutrin caused anxiety, lithium caused severe nausea, Abilify, Seroquel, Adderall, Depakote, Lamictal, Vyvanse, Intuniv was not effective  Allergies: Oxycodone, Sulfa antibiotics, and Tamiflu [oseltamivir phosphate]  Current Medications:  Current Outpatient Medications:    ALPRAZolam (XANAX) 0.25 MG tablet, TAKE 1 TO 1 AND 1/2 TABLETS(0.25 TO 0.375 MG) BY MOUTH TWICE DAILY AS NEEDED FOR ANXIETY, Disp: 90 tablet, Rfl: 0   B Complex-Biotin-FA (SUPER B-100 PO), Take by mouth., Disp: , Rfl:    divalproex (DEPAKOTE ER) 500 MG 24 hr tablet, TAKE 4 TABLETS(2000 MG) BY MOUTH AT BEDTIME, Disp: 360 tablet, Rfl: 0   esomeprazole (NEXIUM) 40 MG capsule, Take 40-80 mg by mouth daily as needed (for acid reflux). , Disp: , Rfl:  hydrochlorothiazide (HYDRODIURIL) 25 MG tablet, Take 25 mg by mouth daily., Disp: , Rfl:    lamoTRIgine (LAMICTAL) 100 MG tablet, Take 1 tablet (100 mg total) by mouth 2 (two) times daily., Disp: 180 tablet, Rfl: 1   lisinopril (PRINIVIL,ZESTRIL) 20 MG tablet, Take 20 mg  by mouth every evening. , Disp: , Rfl:    methylphenidate (RITALIN LA) 40 MG 24 hr capsule, Take 1 capsule (40 mg total) by mouth every morning., Disp: 30 capsule, Rfl: 0   metoprolol succinate (TOPROL-XL) 50 MG 24 hr tablet, Take 50 mg by mouth daily., Disp: , Rfl:    Multiple Vitamin (MULTIVITAMIN) tablet, Take 1 tablet by mouth daily., Disp: , Rfl:    Omega-3 Fatty Acids (OMEGA-3 FISH OIL PO), Take by mouth., Disp: , Rfl:    sertraline (ZOLOFT) 100 MG tablet, TAKE 2 AND 1/2 TABLETS(250 MG) BY MOUTH DAILY, Disp: 225 tablet, Rfl: 1   VITAMIN D PO, Take by mouth., Disp: , Rfl:  Medication Side Effects: none  Family Medical/ Social History: Changes? See HPI  MENTAL HEALTH EXAM:   There were no vitals taken for this visit.There is no height or weight on file to calculate BMI.  General Appearance: Casual and Well Groomed  Eye Contact:  Good  Speech:  Clear and Coherent and Normal Rate  Volume:  Normal  Mood:  Euthymic  Affect:  Congruent  Thought Process:  Goal Directed and Descriptions of Associations: Intact  Orientation:  Full (Time, Place, and Person)  Thought Content: Logical   Suicidal Thoughts:  No  Homicidal Thoughts:  No  Memory:  WNL  Judgement:  Good  Insight:  Good  Psychomotor Activity:  Normal  Concentration:  Concentration: Fair and Attention Span: Fair  Recall:  Good  Fund of Knowledge: Good  Language: Good  Assets:  Desire for Improvement  ADL's:  Intact  Cognition: WNL  Prognosis:  Good   DIAGNOSES:    ICD-10-CM   1. Attention deficit hyperactivity disorder (ADHD), predominantly inattentive type  F90.0     2. Bipolar I disorder ([place])  F31.9     3. Generalized anxiety disorder  F41.1     4. Insomnia due to other mental disorder  F51.05    F99       Receiving Psychotherapy: No   [name], [place]  RECOMMENDATIONS:  PDMP reviewed.  Last Xanax filled 08/09/2022.  Ritalin filled 09/20/2022. I provided 20 minutes of non-face-to-face time during this  encounter, including time spent before and after the visit in records review, medical decision making, counseling pertinent to today's visit, and charting.   Discussed the ADHD.  Recommend increasing the Ritalin LA even further.  She would like to increase to 40 mg even though that is doubling the dose.  She had no improvement at all when going from 10 mg to 20 mg so I think it is fine to go straight to 40 mg.  She knows to watch for increased agitation, inability to sleep, tachycardia.  She is aware that stimulants of any kind can increased risk of mania and she will watch closely for signs and call immediately if they occur. If the depressive symptoms get worse or no better in the next 3 to 4 weeks to let me know.  Change Xanax to 0.25 mg, 1-1.5 pills bid prn anxiety or sleep.  Take sparingly. Continue Depakote ER 500 mg, 4 p.o. nightly. Continue Lamictal 100 mg bid. Increase Ritalin LA to 40 mg, 1 p.o. every morning.  Continue Zoloft 100 mg 2.5 pills daily.   Return in 4 weeks.  Donnal Moat, PA-C

## 2022-10-07 NOTE — Telephone Encounter (Signed)
She can get it filled on 10/13/2022. She should have used 16 pills of 30, yesterday, and would have 14 left. Take 2 q am, then would run out 3/27. Thanks.

## 2022-10-07 NOTE — Telephone Encounter (Signed)
Patient notified

## 2022-10-18 ENCOUNTER — Telehealth: Payer: Self-pay | Admitting: Physician Assistant

## 2022-10-18 NOTE — Telephone Encounter (Signed)
[name] called at 2pm to indicate that she found the methylphenidate ER 40mg  at [facility], [address], [name] [address]

## 2022-10-18 NOTE — Telephone Encounter (Signed)
Please see message from patient. If okay to Rx methylphenidate ER I will pend and cancel Rx for LA dosing. LA is a challenge to find right now.

## 2022-10-18 NOTE — Telephone Encounter (Signed)
[name] called and said that [facility] still doesn't have her refill on  Ritalin. She states that when she spoke to [facility] it shows her still in progress. She gave out around 3/21. Pharmacy is:   [facility] [place], [address] AT [address]   Phone: [phone]  Fax: [phone]

## 2022-10-18 NOTE — Telephone Encounter (Signed)
Pharmacy has the script, they don't have medication in stock, or this was the case last week. Left VM for patient to call other pharmacies to see if available.

## 2022-10-18 NOTE — Telephone Encounter (Signed)
Pharmacy called and said pt is asking if she can have the Methylphenidate ER instead of LA.  Next appt 4/17

## 2022-10-19 ENCOUNTER — Other Ambulatory Visit: Payer: Self-pay

## 2022-10-19 MED ORDER — METHYLPHENIDATE HCL ER (XR) 40 MG PO CP24: 1.0000 | ORAL_CAPSULE | Freq: Every day | ORAL | 0 refills | Status: DC

## 2022-10-19 NOTE — Telephone Encounter (Signed)
Pt called back and said that she is leaving at 2 pm today to go out of town. Please send before then

## 2022-10-19 NOTE — Telephone Encounter (Signed)
Yes, please do. Thanks.

## 2022-10-19 NOTE — Telephone Encounter (Signed)
Pended, was due today.

## 2022-10-21 ENCOUNTER — Telehealth: Payer: Self-pay

## 2022-10-21 NOTE — Telephone Encounter (Signed)
Prior Authorization submitted for Methylphenidate XR 40 mg with [facility], pending response.

## 2022-10-22 NOTE — Telephone Encounter (Signed)
LVM with info

## 2022-10-22 NOTE — Telephone Encounter (Signed)
Prior Approval received for Methylphenidate ER 40 mg effective through 10/19/2025 with [employer]

## 2022-11-03 ENCOUNTER — Telehealth (INDEPENDENT_AMBULATORY_CARE_PROVIDER_SITE_OTHER): Payer: BC Managed Care – PPO | Admitting: Physician Assistant

## 2022-11-03 ENCOUNTER — Encounter: Payer: Self-pay | Admitting: Physician Assistant

## 2022-11-03 DIAGNOSIS — F900 Attention-deficit hyperactivity disorder, predominantly inattentive type: Secondary | ICD-10-CM | POA: Diagnosis not present

## 2022-11-03 DIAGNOSIS — F319 Bipolar disorder, unspecified: Secondary | ICD-10-CM | POA: Diagnosis not present

## 2022-11-03 DIAGNOSIS — F411 Generalized anxiety disorder: Secondary | ICD-10-CM | POA: Diagnosis not present

## 2022-11-03 MED ORDER — DEXMETHYLPHENIDATE HCL ER 25 MG PO CP24: 25.0000 mg | ORAL_CAPSULE | Freq: Every morning | ORAL | 0 refills | Status: DC

## 2022-11-03 NOTE — Progress Notes (Signed)
[facility]  Patient ID: [name],  MRN: [id number]  PCP: [name]  Date of Evaluation: 11/03/2022 time spent:20 minutes   Chief Complaint:  Chief Complaint   Anxiety; ADHD; Depression; Follow-up    Virtual Visit via Telehealth  I connected with patient by a video enabled telemedicine application with their informed consent, and verified patient privacy and that I am speaking with the correct person using two identifiers.  I am private, in my office and the patient is at work.  I discussed the limitations, risks, security and privacy concerns of performing an evaluation and management service by  video and the availability of in person appointments. I also discussed with the patient that there may be a patient responsible charge related to this service. The patient expressed understanding and agreed to proceed.   I discussed the assessment and treatment plan with the patient. The patient was provided an opportunity to ask questions and all were answered. The patient agreed with the plan and demonstrated an understanding of the instructions.   The patient was advised to call back or seek an in-person evaluation if the symptoms worsen or if the condition fails to improve as anticipated.  I provided 20 minutes of non-face-to-face time during this encounter.  HISTORY/CURRENT STATUS: For routine med check.  We increased Ritalin last time. Even at the higher dose. Disappointed b/c can't find a good treatment for the ADHD. Has never been officially dx w/ ADD/HD. Can't stay on task.   Patient is able to enjoy things.  Energy and motivation are good.  Work is going well.   No extreme sadness, tearfulness, or feelings of hopelessness.  Sleeps well most of the time. ADLs and personal hygiene are normal.  Appetite has not changed.  Weight is stable.  Denies suicidal or homicidal thoughts.  Patient denies increased energy with decreased need for sleep,  increased talkativeness, racing thoughts, impulsivity or risky behaviors, increased spending, increased libido, grandiosity, increased irritability or anger, paranoia, or hallucinations.  Denies dizziness, syncope, seizures, numbness, tingling, tremor, tics, unsteady gait, slurred speech, confusion. Denies muscle or joint pain, stiffness, or dystonia.  Individual Medical History/ Review of Systems: Changes? :No     Past medications for mental health diagnoses include: Zoloft, trazodone, Xanax, Prozac, Vraylar, Cymbalta, Rexulti, Wellbutrin caused anxiety, lithium caused severe nausea, Abilify, Seroquel, Adderall, Depakote, Lamictal, Vyvanse, Intuniv was not effective  Allergies: Oxycodone, Sulfa antibiotics, and Tamiflu [oseltamivir phosphate]  Current Medications:  Current Outpatient Medications:    ALPRAZolam (XANAX) 0.25 MG tablet, TAKE 1 TO 1 AND 1/2 TABLETS(0.25 TO 0.375 MG) BY MOUTH TWICE DAILY AS NEEDED FOR ANXIETY, Disp: 90 tablet, Rfl: 0   B Complex-Biotin-FA (SUPER B-100 PO), Take by mouth., Disp: , Rfl:    Dexmethylphenidate HCl 25 MG CP24, Take 1 capsule (25 mg total) by mouth in the morning., Disp: 30 capsule, Rfl: 0   divalproex (DEPAKOTE ER) 500 MG 24 hr tablet, TAKE 4 TABLETS(2000 MG) BY MOUTH AT BEDTIME, Disp: 360 tablet, Rfl: 0   esomeprazole (NEXIUM) 40 MG capsule, Take 40-80 mg by mouth daily as needed (for acid reflux). , Disp: , Rfl:    hydrochlorothiazide (HYDRODIURIL) 25 MG tablet, Take 25 mg by mouth daily., Disp: , Rfl:    lamoTRIgine (LAMICTAL) 100 MG tablet, Take 1 tablet (100 mg total) by mouth 2 (two) times daily., Disp: 180 tablet, Rfl: 1   lisinopril (PRINIVIL,ZESTRIL) 20 MG tablet, Take 20 mg by mouth every evening. , Disp: ,  Rfl:    metoprolol succinate (TOPROL-XL) 50 MG 24 hr tablet, Take 50 mg by mouth daily., Disp: , Rfl:    Multiple Vitamin (MULTIVITAMIN) tablet, Take 1 tablet by mouth daily., Disp: , Rfl:    Omega-3 Fatty Acids (OMEGA-3 FISH OIL PO), Take  by mouth., Disp: , Rfl:    sertraline (ZOLOFT) 100 MG tablet, TAKE 2 AND 1/2 TABLETS(250 MG) BY MOUTH DAILY, Disp: 225 tablet, Rfl: 1   VITAMIN D PO, Take by mouth., Disp: , Rfl:  Medication Side Effects: none  Family Medical/ Social History: Changes? None  MENTAL HEALTH EXAM:   There were no vitals taken for this visit.There is no height or weight on file to calculate BMI.  General Appearance: Casual and Well Groomed  Eye Contact:  Good  Speech:  Clear and Coherent and Normal Rate  Volume:  Normal  Mood:  Euthymic  Affect:  Congruent  Thought Process:  Goal Directed and Descriptions of Associations: Intact  Orientation:  Full (Time, Place, and Person)  Thought Content: Logical   Suicidal Thoughts:  No  Homicidal Thoughts:  No  Memory:  WNL  Judgement:  Good  Insight:  Good  Psychomotor Activity:  Normal  Concentration:  Concentration: Fair and Attention Span: Fair  Recall:  Good  Fund of Knowledge: Good  Language: Good  Assets:  Desire for Improvement Financial Resources/Insurance Housing Transportation Vocational/Educational  ADL's:  Intact  Cognition: WNL  Prognosis:  Good   DIAGNOSES:    ICD-10-CM   1. Attention deficit hyperactivity disorder (ADHD), predominantly inattentive type  F90.0     2. Bipolar I disorder  F31.9     3. Generalized anxiety disorder  F41.1       Receiving Psychotherapy: No   Stevphen Meuse, [facility] C.  RECOMMENDATIONS:  PDMP reviewed.  Last Xanax filled 08/09/2022.  Ritalin filled 10/22/2022.   I provided 20 minutes of non face to face time during this encounter, including time spent before and after the visit in records review, medical decision making, counseling pertinent to today's visit, and charting.   Discussed different tx. She's been on numerous stimulants and non-stimulants w/o success. Will try Focalin. Never tired Concerta. Consider Cotempla or Adzenys. Also recommend [place] [facility] for further eval and tx.    Discontinue Ritalin LA. Change Xanax to 0.25 mg, 1-1.5 pills bid prn anxiety or sleep.  Take sparingly. Start Focalin XR 25 mg, 1 p.o. every morning. Continue Depakote ER 500 mg, 4 p.o. nightly. Continue Lamictal 100 mg bid. Continue Zoloft 100 mg 2.5 pills daily.   Return in 4 weeks.  Melony Overly, PA-C

## 2022-11-05 ENCOUNTER — Telehealth: Payer: Self-pay | Admitting: Physician Assistant

## 2022-11-05 NOTE — Telephone Encounter (Signed)
[facility] sent PA Request for Dexmethylphenidate HCL ER , see CMM

## 2022-11-07 NOTE — Telephone Encounter (Signed)
Prior Approval received for Dexmethylphenidate HCl ER  er capsules effective 11/05/2022-11/04/2025 with [facility]

## 2022-11-12 ENCOUNTER — Other Ambulatory Visit: Payer: Self-pay | Admitting: Physician Assistant

## 2022-12-02 ENCOUNTER — Ambulatory Visit: Payer: BC Managed Care – PPO | Admitting: Pulmonary Disease

## 2022-12-09 ENCOUNTER — Encounter: Payer: Self-pay | Admitting: Physician Assistant

## 2022-12-09 ENCOUNTER — Telehealth (INDEPENDENT_AMBULATORY_CARE_PROVIDER_SITE_OTHER): Payer: BC Managed Care – PPO | Admitting: Physician Assistant

## 2022-12-09 DIAGNOSIS — F411 Generalized anxiety disorder: Secondary | ICD-10-CM

## 2022-12-09 DIAGNOSIS — F900 Attention-deficit hyperactivity disorder, predominantly inattentive type: Secondary | ICD-10-CM | POA: Diagnosis not present

## 2022-12-09 DIAGNOSIS — F5105 Insomnia due to other mental disorder: Secondary | ICD-10-CM | POA: Diagnosis not present

## 2022-12-09 DIAGNOSIS — F319 Bipolar disorder, unspecified: Secondary | ICD-10-CM | POA: Diagnosis not present

## 2022-12-09 DIAGNOSIS — F99 Mental disorder, not otherwise specified: Secondary | ICD-10-CM

## 2022-12-09 MED ORDER — DEXMETHYLPHENIDATE HCL ER 35 MG PO CP24: 35.0000 mg | ORAL_CAPSULE | Freq: Every morning | ORAL | 0 refills | Status: DC

## 2022-12-09 MED ORDER — SERTRALINE HCL 100 MG PO TABS: ORAL_TABLET | ORAL | 1 refills | Status: DC

## 2022-12-09 NOTE — Progress Notes (Signed)
[facility]  Patient ID: [name],  MRN: [id number]  PCP: [name]  Date of Evaluation: 12/09/2022 time spent:20 minutes   Chief Complaint:  Chief Complaint   ADHD; Insomnia; Depression; Follow-up    Virtual Visit via Telehealth  I connected with patient by a video enabled telemedicine application with their informed consent, and verified patient privacy and that I am speaking with the correct person using two identifiers.  I am private, in my office and the patient is at work.  I discussed the limitations, risks, security and privacy concerns of performing an evaluation and management service by  video and the availability of in person appointments. I also discussed with the patient that there may be a patient responsible charge related to this service. The patient expressed understanding and agreed to proceed.   I discussed the assessment and treatment plan with the patient. The patient was provided an opportunity to ask questions and all were answered. The patient agreed with the plan and demonstrated an understanding of the instructions.   The patient was advised to call back or seek an in-person evaluation if the symptoms worsen or if the condition fails to improve as anticipated.  I provided 20 minutes of non-face-to-face time during this encounter.  HISTORY/CURRENT STATUS: For routine med check.  The Focalin is helping.  But she feels like it may be a higher dose would be even more beneficial.  "I feel some hope now like there is something that can help me."  She has tried several other medications for the ADHD and they have not been effective.  She has a little more energy and is able to stay on task more than she had been but still not enough to get through her work day.  She is not sleeping very well.  She has a hard time falling asleep, does drink caffeine after supper.  This is not new though.  She gets up at 5:00 so in order to get  adequate sleep she would have to go to bed really early and she does not want to do that or feel sleepy at that time.  Patient is able to enjoy things. Work is going well.  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  Her husband is retiring in 1 month so they hope to travel some and enjoy themselves.  No sadness, tearfulness, or feelings of hopelessness.  ADLs and personal hygiene are normal.  Appetite has not changed.  Weight is stable.  Denies suicidal or homicidal thoughts.  Patient denies increased energy with decreased need for sleep, increased talkativeness, racing thoughts, impulsivity or risky behaviors, increased spending, increased libido, grandiosity, increased irritability or anger, paranoia, or hallucinations.  Denies dizziness, syncope, seizures, numbness, tingling, tremor, tics, unsteady gait, slurred speech, confusion. Denies muscle or joint pain, stiffness, or dystonia. Denies unexplained weight loss, frequent infections, or sores that heal slowly.  No polyphagia, polydipsia, or polyuria. Denies visual changes or paresthesias.   Individual Medical History/ Review of Systems: Changes? :No     Past medications for mental health diagnoses include: Zoloft, trazodone, Xanax, Prozac, Vraylar, Cymbalta, Rexulti, Wellbutrin caused anxiety, lithium caused severe nausea, Abilify, Seroquel, Adderall, Depakote, Lamictal, Vyvanse, Intuniv was not effective  Allergies: Oxycodone, Sulfa antibiotics, and Tamiflu [oseltamivir phosphate]  Current Medications:  Current Outpatient Medications:    ALPRAZolam (XANAX) 0.25 MG tablet, TAKE 1 TO 1  AND 1/2 TABLETS(0.25 TO 0.375 MG) BY MOUTH TWICE DAILY AS NEEDED FOR ANXIETY, Disp: 90 tablet, Rfl: 0   B Complex-Biotin-FA (SUPER B-100 PO), Take by mouth., Disp: , Rfl:    Dexmethylphenidate HCl 35 MG CP24, Take 35 mg by mouth in the morning., Disp: 30 capsule, Rfl: 0   divalproex  (DEPAKOTE ER) 500 MG 24 hr tablet, TAKE 4 TABLETS(2000 MG) BY MOUTH AT BEDTIME, Disp: 360 tablet, Rfl: 0   esomeprazole (NEXIUM) 40 MG capsule, Take 40-80 mg by mouth daily as needed (for acid reflux). , Disp: , Rfl:    hydrochlorothiazide (HYDRODIURIL) 25 MG tablet, Take 25 mg by mouth daily., Disp: , Rfl:    lamoTRIgine (LAMICTAL) 100 MG tablet, Take 1 tablet (100 mg total) by mouth 2 (two) times daily., Disp: 180 tablet, Rfl: 1   lisinopril (PRINIVIL,ZESTRIL) 20 MG tablet, Take 20 mg by mouth every evening. , Disp: , Rfl:    metoprolol succinate (TOPROL-XL) 50 MG 24 hr tablet, Take 50 mg by mouth daily., Disp: , Rfl:    Multiple Vitamin (MULTIVITAMIN) tablet, Take 1 tablet by mouth daily., Disp: , Rfl:    Omega-3 Fatty Acids (OMEGA-3 FISH OIL PO), Take by mouth., Disp: , Rfl:    VITAMIN D PO, Take by mouth., Disp: , Rfl:    sertraline (ZOLOFT) 100 MG tablet, TAKE 2 AND 1/2 TABLETS(250 MG) BY MOUTH DAILY, Disp: 225 tablet, Rfl: 1 Medication Side Effects: none  Family Medical/ Social History: Changes? None  MENTAL HEALTH EXAM:   There were no vitals taken for this visit.There is no height or weight on file to calculate BMI.  General Appearance: Casual and Well Groomed  Eye Contact:  Good  Speech:  Clear and Coherent and Normal Rate  Volume:  Normal  Mood:  Euthymic  Affect:  Congruent  Thought Process:  Goal Directed and Descriptions of Associations: Intact  Orientation:  Full (Time, Place, and Person)  Thought Content: Logical   Suicidal Thoughts:  No  Homicidal Thoughts:  No  Memory:  WNL  Judgement:  Good  Insight:  Good  Psychomotor Activity:  Normal  Concentration:  Concentration: Fair and Attention Span: Fair  Recall:  Good  Fund of Knowledge: Good  Language: Good  Assets:  Communication Skills Desire for Improvement Financial Resources/Insurance Housing Transportation Vocational/Educational  ADL's:  Intact  Cognition: WNL  Prognosis:  Good   DIAGNOSES:     ICD-10-CM   1. Attention deficit hyperactivity disorder (ADHD), predominantly inattentive type  F90.0     2. Bipolar I disorder (HCC)  F31.9     3. Generalized anxiety disorder  F41.1     4. Insomnia due to other mental disorder  F51.05    F99      Receiving Psychotherapy: No   Stevphen Meuse, [facility] C.  RECOMMENDATIONS:  PDMP reviewed.  Last Focalin filled 11/06/2022.  Xanax filled 08/09/2022.   I provided 20 minutes of non-face-to-face time during this encounter, including time spent before and after the visit in records review, medical decision making, counseling pertinent to today's visit, and charting.   Recommend increasing Focalin.  She would like to try that.  Change Xanax to 0.25 mg, 1-1.5 pills bid prn anxiety or sleep.  Take sparingly. Increase Focalin XR to 35 mg,  1 p.o. every morning. Continue Depakote ER 500 mg, 4 p.o. nightly. Continue Lamictal 100 mg bid. Continue Zoloft 100 mg 2.5 pills daily.   Return in 2 months.   Melony Overly, PA-C

## 2022-12-10 ENCOUNTER — Other Ambulatory Visit: Payer: Self-pay | Admitting: Physician Assistant

## 2022-12-10 ENCOUNTER — Telehealth: Payer: Self-pay | Admitting: Physician Assistant

## 2022-12-10 MED ORDER — DEXMETHYLPHENIDATE HCL ER 35 MG PO CP24: 35.0000 mg | ORAL_CAPSULE | Freq: Every morning | ORAL | 0 refills | Status: DC

## 2022-12-10 NOTE — Telephone Encounter (Signed)
Pt called and said that the pharmacy doesn't have her focalin in stock. Please cancel and send a new script [facility] located at [phone] [place] [address]. Phone number is [phone].

## 2022-12-10 NOTE — Telephone Encounter (Signed)
sent 

## 2022-12-14 ENCOUNTER — Telehealth: Payer: Self-pay

## 2022-12-14 NOTE — Telephone Encounter (Signed)
[name] called at 2:45 to indicate that the pharmacy told her that the increase to 35mg  Focalin requires a PA.  If not already done, please do a PA for the increase of dose.

## 2022-12-14 NOTE — Telephone Encounter (Signed)
Prior Authorization Dexmethylphenidate HCl ER 35MG  er capsules #30/30 [employer]

## 2022-12-14 NOTE — Telephone Encounter (Signed)
Initiated PA in [facility].

## 2022-12-16 NOTE — Telephone Encounter (Signed)
[facility] [employer] approved DEXMETHYLPHENIDATE ER 35mg  from 12/16/22-12/15/2025.

## 2023-01-10 ENCOUNTER — Other Ambulatory Visit: Payer: Self-pay | Admitting: Physician Assistant

## 2023-01-10 NOTE — Telephone Encounter (Signed)
Due 6/28

## 2023-01-14 ENCOUNTER — Other Ambulatory Visit: Payer: Self-pay | Admitting: Physician Assistant

## 2023-02-08 ENCOUNTER — Encounter: Payer: Self-pay | Admitting: Physician Assistant

## 2023-02-08 ENCOUNTER — Telehealth (INDEPENDENT_AMBULATORY_CARE_PROVIDER_SITE_OTHER): Payer: BC Managed Care – PPO | Admitting: Physician Assistant

## 2023-02-08 DIAGNOSIS — R5381 Other malaise: Secondary | ICD-10-CM | POA: Diagnosis not present

## 2023-02-08 DIAGNOSIS — F411 Generalized anxiety disorder: Secondary | ICD-10-CM | POA: Diagnosis not present

## 2023-02-08 DIAGNOSIS — R5383 Other fatigue: Secondary | ICD-10-CM

## 2023-02-08 DIAGNOSIS — F900 Attention-deficit hyperactivity disorder, predominantly inattentive type: Secondary | ICD-10-CM

## 2023-02-08 DIAGNOSIS — F319 Bipolar disorder, unspecified: Secondary | ICD-10-CM

## 2023-02-08 DIAGNOSIS — Z79899 Other long term (current) drug therapy: Secondary | ICD-10-CM

## 2023-02-08 MED ORDER — DEXMETHYLPHENIDATE HCL ER 35 MG PO CP24: 1.0000 | ORAL_CAPSULE | Freq: Every morning | ORAL | 0 refills | Status: DC

## 2023-02-08 MED ORDER — DEXMETHYLPHENIDATE HCL ER 35 MG PO CP24: 35.0000 mg | ORAL_CAPSULE | Freq: Every morning | ORAL | 0 refills | Status: DC

## 2023-02-08 MED ORDER — ALPRAZOLAM 0.25 MG PO TABS: ORAL_TABLET | ORAL | 0 refills | Status: DC

## 2023-02-08 NOTE — Progress Notes (Signed)
[facility]  Patient ID: [name],  MRN: [id number]  PCP: [name]  Date of Evaluation: 12/09/2022 time spent: 32 minutes   Chief Complaint:  Chief Complaint   Anxiety; ADD; Depression; Follow-up     Virtual Visit via Telehealth  I connected with patient by a video enabled telemedicine application with their informed consent, and verified patient privacy and that I am speaking with the correct person using two identifiers.  I am private, in my office and the patient is at home.  I discussed the limitations, risks, security and privacy concerns of performing an evaluation and management service by  video and the availability of in person appointments. I also discussed with the patient that there may be a patient responsible charge related to this service. The patient expressed understanding and agreed to proceed.   I discussed the assessment and treatment plan with the patient. The patient was provided an opportunity to ask questions and all were answered. The patient agreed with the plan and demonstrated an understanding of the instructions.   The patient was advised to call back or seek an in-person evaluation if the symptoms worsen or if the condition fails to improve as anticipated.  I provided 32 minutes of non-face-to-face time during this encounter.  HISTORY/CURRENT STATUS: For routine med check.  Doing well with her mental health. 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 Has enjoyed some time off.  Has been to the beach several times. 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  She is tired a lot.  Wonders if labs can be drawn to rule out any issues like vitamin deficiencies or what ever.  No extreme sadness, tearfulness, or feelings of hopelessness.  Sleeps well most of the time. ADLs and personal hygiene are normal.  Appetite has not changed.  Weight is stable.  Denies laxative use,  calorie restricting, or binging and purging.   Denies cutting or any form of self-harm.  Anxiety is well-controlled, doesn't need the xanax often. Denies suicidal or homicidal thoughts.  We increased the Focalin dose at the last visit.  That has been helpful.  States that attention is good without easy distractibility.  Able to focus on things and finish tasks to completion.   Patient denies increased energy with decreased need for sleep, increased talkativeness, racing thoughts, impulsivity or risky behaviors, increased spending, increased libido, grandiosity, increased irritability or anger, paranoia, or hallucinations.  Denies dizziness, syncope, seizures, numbness, tingling, tremor, tics, unsteady gait, slurred speech, confusion. Denies muscle or joint pain, stiffness, or dystonia. Denies unexplained weight loss, frequent infections, or sores that heal slowly.  No polyphagia, polydipsia, or polyuria. Denies visual changes or paresthesias.   Individual Medical History/ Review of Systems: Changes? :Yes    has cyst on her back that's infected, starting ATBX today and then will see Derm She follows her BP at home, it's mostly  Past medications for mental health diagnoses include: Zoloft, trazodone, Xanax, Prozac, Vraylar, Cymbalta, Rexulti, Wellbutrin caused anxiety, lithium caused severe nausea, Abilify, Seroquel, Adderall, Depakote, Lamictal, Vyvanse, Intuniv was not effective  Allergies: Oxycodone, Sulfa antibiotics, and Tamiflu [oseltamivir phosphate]  Current Medications:  Current Outpatient Medications:    B Complex-Biotin-FA (SUPER B-100 PO), Take by mouth., Disp: , Rfl:    [START ON 03/10/2023] Dexmethylphenidate HCl 35 MG CP24, Take 35 mg by mouth in the morning., Disp: 30 capsule, Rfl: 0   Dexmethylphenidate HCl  35 MG CP24, Take 35 mg by mouth in the morning., Disp: 30 capsule, Rfl: 0   divalproex (DEPAKOTE ER) 500 MG 24 hr tablet, TAKE 4 TABLETS(2000 MG) BY MOUTH AT BEDTIME, Disp: 360  tablet, Rfl: 0   esomeprazole (NEXIUM) 40 MG capsule, Take 40-80 mg by mouth daily as needed (for acid reflux). , Disp: , Rfl:    hydrochlorothiazide (HYDRODIURIL) 25 MG tablet, Take 25 mg by mouth daily., Disp: , Rfl:    lamoTRIgine (LAMICTAL) 100 MG tablet, Take 1 tablet (100 mg total) by mouth 2 (two) times daily., Disp: 180 tablet, Rfl: 1   lisinopril (PRINIVIL,ZESTRIL) 20 MG tablet, Take 20 mg by mouth every evening. , Disp: , Rfl:    metoprolol succinate (TOPROL-XL) 50 MG 24 hr tablet, Take 50 mg by mouth daily., Disp: , Rfl:    Multiple Vitamin (MULTIVITAMIN) tablet, Take 1 tablet by mouth daily., Disp: , Rfl:    Omega-3 Fatty Acids (OMEGA-3 FISH OIL PO), Take by mouth., Disp: , Rfl:    sertraline (ZOLOFT) 100 MG tablet, TAKE 2 AND 1/2 TABLETS(250 MG) BY MOUTH DAILY, Disp: 225 tablet, Rfl: 1   VITAMIN D PO, Take by mouth., Disp: , Rfl:    ALPRAZolam (XANAX) 0.25 MG tablet, TAKE 1 TO 1 AND 1/2 TABLETS(0.25 TO 0.375 MG) BY MOUTH TWICE DAILY AS NEEDED FOR ANXIETY, Disp: 90 tablet, Rfl: 0   [START ON 04/09/2023] Dexmethylphenidate HCl 35 MG CP24, Take 1 capsule by mouth every morning., Disp: 30 capsule, Rfl: 0 Medication Side Effects: none  Family Medical/ Social History: Changes? None  MENTAL HEALTH EXAM:   There were no vitals taken for this visit.There is no height or weight on file to calculate BMI.  General Appearance: Casual and Well Groomed  Eye Contact:  Good  Speech:  Clear and Coherent and Normal Rate  Volume:  Normal  Mood:  Euthymic  Affect:  Congruent  Thought Process:  Goal Directed and Descriptions of Associations: Intact  Orientation:  Full (Time, Place, and Person)  Thought Content: Logical   Suicidal Thoughts:  No  Homicidal Thoughts:  No  Memory:  WNL  Judgement:  Good  Insight:  Good  Psychomotor Activity:  Normal  Concentration:  Concentration: Good and Attention Span: Good  Recall:  Good  Fund of Knowledge: Good  Language: Good  Assets:  Communication  Skills Desire for Improvement Financial Resources/Insurance Housing Transportation Vocational/Educational  ADL's:  Intact  Cognition: WNL  Prognosis:  Good   DIAGNOSES:    ICD-10-CM   1. Encounter for long-term (current) use of medications  Z79.899 CBC with Differential/Platelet    Comprehensive metabolic panel    Valproic acid level    VITAMIN D 25 Hydroxy (Vit-D Deficiency, Fractures)    B12 and Folate Panel    TSH    2. Bipolar I disorder (HCC)  F31.9 CBC with Differential/Platelet    Comprehensive metabolic panel    Valproic acid level    VITAMIN D 25 Hydroxy (Vit-D Deficiency, Fractures)    B12 and Folate Panel    3. Malaise and fatigue  R53.81 CBC with Differential/Platelet   R53.83 Comprehensive metabolic panel    VITAMIN D 25 Hydroxy (Vit-D Deficiency, Fractures)    B12 and Folate Panel    TSH    4. Generalized anxiety disorder  F41.1     5. Attention deficit hyperactivity disorder (ADHD), predominantly inattentive type  F90.0      Receiving Psychotherapy: No   Stevphen Meuse, [facility] C.  RECOMMENDATIONS:  PDMP reviewed.  Last Focalin filled 01/14/2023.  Xanax filled 08/09/2022. I provided 32 minutes of non-face-to-face time during this encounter, including time spent before and after the visit in records review, medical decision making, counseling pertinent to today's visit, and charting.   She is doing well as far as her medications go so no changes will be made. We discussed the fatigue, healthy diet, exercise, see her PCP if needed, I will check for anemia, B12 and folate deficiency, vitamin D deficiency, abnormal thyroid, also check valproic acid level.  Change Xanax to 0.25 mg, 1-1.5 pills bid prn anxiety or sleep.  Take sparingly. Continue Focalin XR 35 mg,  1 p.o. every morning. Continue Depakote ER 500 mg, 4 p.o. nightly. Continue Lamictal 100 mg bid. Continue Zoloft 100 mg 2.5 pills daily. Labs ordered as above. Return in 3-4 months.   Melony Overly,  PA-C

## 2023-02-17 ENCOUNTER — Encounter (INDEPENDENT_AMBULATORY_CARE_PROVIDER_SITE_OTHER): Payer: BC Managed Care – PPO

## 2023-02-17 ENCOUNTER — Encounter (HOSPITAL_BASED_OUTPATIENT_CLINIC_OR_DEPARTMENT_OTHER): Payer: Self-pay | Admitting: Pulmonary Disease

## 2023-02-17 ENCOUNTER — Ambulatory Visit (HOSPITAL_BASED_OUTPATIENT_CLINIC_OR_DEPARTMENT_OTHER): Payer: BC Managed Care – PPO | Admitting: Pulmonary Disease

## 2023-02-17 VITALS — BP 130/78 | HR 77 | Ht 63.0 in | Wt 267.8 lb

## 2023-02-17 DIAGNOSIS — G4733 Obstructive sleep apnea (adult) (pediatric): Secondary | ICD-10-CM | POA: Diagnosis not present

## 2023-02-17 DIAGNOSIS — G4710 Hypersomnia, unspecified: Secondary | ICD-10-CM

## 2023-02-17 NOTE — Assessment & Plan Note (Signed)
New full face mask or nasal cradle mask  You can try [email or web address] if DME does not carry  If this does not work, call us back for a new CPAP order-we can provide auto CPAP 5 to 12 cm   Otherwise would recommend overnight sleep study followed by MSLT  Weight loss encouraged, compliance with goal of at least 4-6 hrs every night is the expectation. Advised against medications with sedative side effects Cautioned against driving when sleepy - understanding that sleepiness will vary on a day to day basis

## 2023-02-17 NOTE — Assessment & Plan Note (Signed)
We discussed that her sleepiness is quite out of proportion to her mild degree of OSA.  Ideally she needs overnight CPAP/NPSG followed by MSLT. She is hesitant to proceed with this due to cost and I am sympathetic to this.  She would like to continue with lifestyle changes before testing

## 2023-02-17 NOTE — Patient Instructions (Signed)
X New full face mask or nasal cradle mask  You can try [email or web address] if DME does not carry  If this does not work, call us back for a new CPAP order   Otherwise would recommend overnight sleep study followed by daytime nap test

## 2023-02-17 NOTE — Progress Notes (Signed)
   Subjective:    Patient ID: [name], female    DOB: May 23, 1971, 52 y.o.   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 This is out of proportion to degree of sleep disordered breathing noted on sleep studies  She has gained 40 pounds over the last few years   PMH -recent diagnosis of ADHD started on Adderall Hypertension Bipolar  Initial OV >> poor compliance, Rx nasal cradle mask Plan for MSLT in the future after better compliance on CPAP.  We reviewed repeat home sleep test which showed mild OSA.  Meantime she was told by DME that they did not have a nasal cradle mask.  She was given a substitute fullface mask.  Her straps are still old and she is still not very happy with the results.  She feels somewhat better rested after using the machine.  She is using Adderall daily and she feels this helps her get through the day.  She admits that her lifestyle has been "very sluggish" after 2 knee surgeries and wonders if this could be causing her fatigue    Significant tests/ events reviewed  01/2023-wt 2 6 7  pounds -AHI 8/hour, low saturation 85%  HST 04/2016 - wt 241 lbs - no sig OSA 04/2016 NPSG >> AHI 5.5/h   Review of Systems neg for any significant sore throat, dysphagia, itching, sneezing, nasal congestion or excess/ purulent secretions, fever, chills, sweats, unintended wt loss, pleuritic or exertional cp, hempoptysis, orthopnea pnd or change in chronic leg swelling. Also denies presyncope, palpitations, heartburn, abdominal pain, nausea, vomiting, diarrhea or change in bowel or urinary habits, dysuria,hematuria, rash, arthralgias, visual complaints, headache, numbness weakness or ataxia.     Objective:   Physical Exam  Gen. Pleasant, obese, in no distress ENT - no lesions, no post nasal drip Neck: No JVD, no thyromegaly, no carotid bruits Lungs: no use of  accessory muscles, no dullness to percussion, decreased without rales or rhonchi  Cardiovascular: Rhythm regular, heart sounds  normal, no murmurs or gallops, no peripheral edema Musculoskeletal: No deformities, no cyanosis or clubbing , no tremors       Assessment & Plan:

## 2023-02-28 ENCOUNTER — Encounter (HOSPITAL_BASED_OUTPATIENT_CLINIC_OR_DEPARTMENT_OTHER): Payer: Self-pay | Admitting: Pulmonary Disease

## 2023-02-28 ENCOUNTER — Other Ambulatory Visit: Payer: Self-pay | Admitting: Physician Assistant

## 2023-02-28 DIAGNOSIS — R5383 Other fatigue: Secondary | ICD-10-CM

## 2023-02-28 DIAGNOSIS — R7989 Other specified abnormal findings of blood chemistry: Secondary | ICD-10-CM

## 2023-02-28 DIAGNOSIS — E039 Hypothyroidism, unspecified: Secondary | ICD-10-CM

## 2023-02-28 NOTE — Progress Notes (Signed)
LVM to RC 

## 2023-02-28 NOTE — Progress Notes (Signed)
Please let her know CBC, B12 and Folate are completely nl so no concern of anemia. Depakote level is good at 72. Cont same dose. Vit D is low nl. Start 50,000 iu weekly and repeat lab in 6 weeks.  Also her TSH is borderline for hypothyroidism. Recommend starting levothyroxine 25 mcg every morning and recheck labs in 6 weeks.  Both the low vitamin D and the slightly elevated TSH could contribute to her fatigue. Please let me know which pharmacy to send the prescriptions to. Please mail lab orders to her.  Thanks.

## 2023-03-01 ENCOUNTER — Other Ambulatory Visit: Payer: Self-pay | Admitting: Physician Assistant

## 2023-03-02 ENCOUNTER — Other Ambulatory Visit: Payer: Self-pay

## 2023-03-03 ENCOUNTER — Other Ambulatory Visit: Payer: Self-pay | Admitting: Physician Assistant

## 2023-03-03 MED ORDER — ERGOCALCIFEROL 1.25 MG (50000 UT) PO CAPS: 50000.0000 [IU] | ORAL_CAPSULE | ORAL | 1 refills | Status: DC

## 2023-03-03 MED ORDER — LEVOTHYROXINE SODIUM 25 MCG PO TABS: 25.0000 ug | ORAL_TABLET | Freq: Every day | ORAL | 1 refills | Status: DC

## 2023-03-03 NOTE — Telephone Encounter (Signed)
I'll send in Rx but remind her to get her labs drawn if she hasn't already. They were ordered a few days ago.

## 2023-03-19 ENCOUNTER — Other Ambulatory Visit: Payer: Self-pay | Admitting: Physician Assistant

## 2023-04-08 ENCOUNTER — Telehealth: Payer: Self-pay | Admitting: Physician Assistant

## 2023-04-08 NOTE — Telephone Encounter (Signed)
Next appt is 05/30/23. [name] said that her RX at [facility] has expired. Requesting refill on Methylphenidate ER 40 mg called to :  [facility] [phone] - ,  - [address] AT [facility] [name]   Phone: [phone]  Fax: [phone]    Its in stock

## 2023-04-08 NOTE — Telephone Encounter (Signed)
Patient [name] 8/29, due 9/27. She said her last RF has different manufactuers and she does not feel like medication was effective.  She wants Amneal. She said the pharmacy will order it for her and should be there on Monday.

## 2023-04-15 ENCOUNTER — Other Ambulatory Visit: Payer: Self-pay

## 2023-04-15 MED ORDER — DEXMETHYLPHENIDATE HCL ER 35 MG PO CP24: 35.0000 mg | ORAL_CAPSULE | Freq: Every morning | ORAL | 0 refills | Status: DC

## 2023-04-15 NOTE — Telephone Encounter (Signed)
Pended.

## 2023-04-23 ENCOUNTER — Other Ambulatory Visit: Payer: Self-pay | Admitting: Physician Assistant

## 2023-05-12 ENCOUNTER — Telehealth: Payer: Self-pay | Admitting: Physician Assistant

## 2023-05-12 NOTE — Telephone Encounter (Signed)
Patient called in stating that she isn't feeling well and that she believes it could be of two things. She is concerned about because the season is changing and she struggled over the spring. She is feeling low. She is also concerned about her medication Focalin says pharmacist gave her totally different pill because pharmacy doesn't have in stock. Its Focalin just not the pill she is used to and thinks that this be why she isn't feeling well. She is unsure of what to do or if Rosey Bath may want to switch her to something else. Ph: [phone] Appt 11/11

## 2023-05-12 NOTE — Telephone Encounter (Signed)
Please see message from patient. She reports not feeling great. She has a preferred manufacturer for her Focalin and has not been able to get it, even though the pharmacy said they could order it and it was noted on Rx.  It is Amneal. She has been on Adderall previously and reported it did nothing for her. She has also been on Ritalin in the past. She also reports she seems to struggle with the season change. She reported the issue last month with the change in manufacturer, but can't tell me if this month is any different as far as any additional sx she is having. She has F/U 11/11.

## 2023-05-13 NOTE — Telephone Encounter (Signed)
If she brings in the bottle of Focalin, then I can send in another stimulant. I don't think she's ever tried Concerta has she? That would be my first choice. It's in the same class as Focalin and would probably work well for her. Is she taking the levothyroxine?  That was started after her last visit and she was supposed to have a repeat TSH and vitamin D level in approximately 6 weeks so she is about 2 months overdue for that and it is important she has those labs done.  Both of those issues can cause or worsen depression.  I do not want to change any antidepressants until we get those values back.  Make sure she has a mood therapy lamp and uses it approximately 30 minutes/day.  Also exercise 30 minutes a day, that can include a walk outside which would be good for her.  Try to avoid carbs and eat more proteins.  Make sure she is taking multivitamin, vitamin D, fish oil, and B complex.  Thanks.

## 2023-05-16 NOTE — Telephone Encounter (Signed)
Patient last filled Focalin 10/1, so is due for a RF in a couple of days.  When you know what dose you will send please let me know and I'll verify pharmacy has in stock and then will pend. She said she will get labs this week. She has a mood therapy lamp but has never used it. Provided supplement recommendations, as well as all other recommendations.

## 2023-05-17 NOTE — Telephone Encounter (Signed)
It'll be Concerta 36 mg, #60. 2 po q am.

## 2023-05-17 NOTE — Telephone Encounter (Signed)
[facility] on [address] has in stock today, but they only have #60. Patient not due for RF until tomorrow.

## 2023-05-19 ENCOUNTER — Telehealth: Payer: Self-pay

## 2023-05-19 ENCOUNTER — Other Ambulatory Visit: Payer: Self-pay

## 2023-05-19 MED ORDER — METHYLPHENIDATE HCL ER (OSM) 36 MG PO TBCR: 72.0000 mg | EXTENDED_RELEASE_TABLET | Freq: Every morning | ORAL | 0 refills | Status: DC

## 2023-05-19 NOTE — Telephone Encounter (Addendum)
Prior Authorization Methylphenidate 36 mg #60/30 [employer]  PA approved Effective:  05/19/2023 - 05/18/2026

## 2023-05-19 NOTE — Telephone Encounter (Signed)
Pended.

## 2023-05-19 NOTE — Telephone Encounter (Signed)
Pt called today.  It doesn't look like the Concerta was sent yet.  She's asking for it be sent to [facility] [address] [place], [place], [place] [address].   Next appt 11/11

## 2023-05-23 ENCOUNTER — Other Ambulatory Visit: Payer: Self-pay | Admitting: Physician Assistant

## 2023-05-23 NOTE — Telephone Encounter (Signed)
Patient was supposed to get repeat labs last week. Results would determine if she needed to continue. There are no results. Call her and remind her to get labs. 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

## 2023-05-24 ENCOUNTER — Other Ambulatory Visit: Payer: Self-pay | Admitting: Physician Assistant

## 2023-05-24 NOTE — Telephone Encounter (Signed)
Lf 09/26

## 2023-05-26 NOTE — Telephone Encounter (Signed)
LVM for patient to get labs done

## 2023-05-26 NOTE — Telephone Encounter (Signed)
Spoke with pt, she just had her labs drawn today.

## 2023-05-27 LAB — VITAMIN D 25 HYDROXY (VIT D DEFICIENCY, FRACTURES): Vit D, 25-Hydroxy: 32.6 ng/mL (ref 30.0–100.0)

## 2023-05-27 LAB — TSH: TSH: 3.99 u[IU]/mL (ref 0.450–4.500)

## 2023-05-27 NOTE — Progress Notes (Signed)
Raven please let her know the thyroid result is in good range.  Continue the same dose of levothyroxine.  Her Vitamin D is still low normal.  Continue weekly vitamin D.  I sent in the prescription that was requested yesterday.  Thank you.

## 2023-05-30 ENCOUNTER — Ambulatory Visit: Payer: BC Managed Care – PPO | Admitting: Physician Assistant

## 2023-05-30 ENCOUNTER — Encounter: Payer: Self-pay | Admitting: Physician Assistant

## 2023-05-30 DIAGNOSIS — Z79899 Other long term (current) drug therapy: Secondary | ICD-10-CM

## 2023-05-30 DIAGNOSIS — F319 Bipolar disorder, unspecified: Secondary | ICD-10-CM | POA: Diagnosis not present

## 2023-05-30 DIAGNOSIS — R7989 Other specified abnormal findings of blood chemistry: Secondary | ICD-10-CM | POA: Diagnosis not present

## 2023-05-30 DIAGNOSIS — F900 Attention-deficit hyperactivity disorder, predominantly inattentive type: Secondary | ICD-10-CM | POA: Diagnosis not present

## 2023-05-30 DIAGNOSIS — F411 Generalized anxiety disorder: Secondary | ICD-10-CM

## 2023-05-30 MED ORDER — METHYLPHENIDATE HCL ER (OSM) 36 MG PO TBCR: 72.0000 mg | EXTENDED_RELEASE_TABLET | Freq: Every day | ORAL | 0 refills | Status: DC

## 2023-05-30 MED ORDER — VITAMIN D (ERGOCALCIFEROL) 1.25 MG (50000 UNIT) PO CAPS: 50000.0000 [IU] | ORAL_CAPSULE | ORAL | 1 refills | Status: DC

## 2023-05-30 MED ORDER — METHYLPHENIDATE HCL ER (OSM) 36 MG PO TBCR: 72.0000 mg | EXTENDED_RELEASE_TABLET | Freq: Every morning | ORAL | 0 refills | Status: DC

## 2023-05-30 NOTE — Progress Notes (Signed)
[facility]  Patient ID: [name],  MRN: [id number]  PCP: [name]  Date of Evaluation: 05/30/2023 time spent:30 minutes   Chief Complaint:  Chief Complaint   Depression; ADHD; Follow-up    HISTORY/CURRENT STATUS: For routine med check.  Is doing pretty well. Feels like her meds are working well. Mood is stable. She doesn't have depression or mood changes like she does in the spring. Patient is able to enjoy things.  Energy and motivation are good.  Work is going well.   No extreme sadness, tearfulness, or feelings of hopelessness.  Sleeps well most of the time. ADLs and personal hygiene are normal.   Appetite has not changed.  Weight is stable.  Not having anxiety, most of the time.  Denies suicidal or homicidal thoughts.  Concerta is working well. Lasts all day. States that attention is good without easy distractibility.  Able to focus on things and finish tasks to completion.   Patient denies increased energy with decreased need for sleep, increased talkativeness, racing thoughts, impulsivity or risky behaviors, increased spending, increased libido, grandiosity, increased irritability or anger, paranoia, or hallucinations.  Denies dizziness, syncope, seizures, numbness, tingling, tremor, tics, unsteady gait, slurred speech, confusion. Denies muscle or joint pain, stiffness, or dystonia.  Individual Medical History/ Review of Systems: Changes? :No      Past medications for mental health diagnoses include: Zoloft, trazodone, Xanax, Prozac, Vraylar, Cymbalta, Rexulti, Wellbutrin caused anxiety, lithium caused severe nausea, Abilify, Seroquel, Adderall, Depakote, Lamictal, Vyvanse, Intuniv was not effective  Allergies: Drisdol [ergocalciferol], Oxycodone, Sulfa antibiotics, and Tamiflu [oseltamivir phosphate]  Current Medications:  Current Outpatient Medications:    ALPRAZolam (XANAX) 0.25 MG tablet, TAKE 1 TO 1 AND 1/2 TABLETS(0.25 TO 0.375 MG)  BY MOUTH TWICE DAILY AS NEEDED FOR ANXIETY, Disp: 90 tablet, Rfl: 0   B Complex-Biotin-FA (SUPER B-100 PO), Take by mouth., Disp: , Rfl:    divalproex (DEPAKOTE ER) 500 MG 24 hr tablet, Take 4 tablets (2,000 mg total) by mouth daily., Disp: 360 tablet, Rfl: 0   esomeprazole (NEXIUM) 40 MG capsule, Take 40-80 mg by mouth daily as needed (for acid reflux). , Disp: , Rfl:    hydrochlorothiazide (HYDRODIURIL) 25 MG tablet, Take 25 mg by mouth daily., Disp: , Rfl:    lamoTRIgine (LAMICTAL) 100 MG tablet, TAKE 1 TABLET(100 MG) BY MOUTH TWICE DAILY, Disp: 180 tablet, Rfl: 0   levothyroxine (SYNTHROID) 25 MCG tablet, TAKE 1 TABLET(25 MCG) BY MOUTH DAILY BEFORE BREAKFAST, Disp: 90 tablet, Rfl: 0   lisinopril (PRINIVIL,ZESTRIL) 20 MG tablet, Take 20 mg by mouth every evening. , Disp: , Rfl:    [START ON 06/18/2023] methylphenidate (CONCERTA) 36 MG PO CR tablet, Take 2 tablets (72 mg total) by mouth daily., Disp: 60 tablet, Rfl: 0   [START ON 07/17/2023] methylphenidate (CONCERTA) 36 MG PO CR tablet, Take 2 tablets (72 mg total) by mouth daily., Disp: 60 tablet, Rfl: 0   metoprolol succinate (TOPROL-XL) 50 MG 24 hr tablet, Take 50 mg by mouth daily., Disp: , Rfl:    sertraline (ZOLOFT) 100 MG tablet, TAKE 2 AND 1/2 TABLETS(250 MG) BY MOUTH DAILY, Disp: 225 tablet, Rfl: 0   [START ON 08/16/2023] methylphenidate 36 MG PO CR tablet, Take 2 tablets (72 mg total) by mouth in the morning., Disp: 60 tablet, Rfl: 0   Multiple Vitamin (MULTIVITAMIN) tablet, Take 1 tablet by mouth daily. (Patient not taking: Reported on 05/30/2023), Disp: , Rfl:    Omega-3 Fatty Acids (OMEGA-3  FISH OIL PO), Take by mouth. (Patient not taking: Reported on 05/30/2023), Disp: , Rfl:    VITAMIN D PO, Take by mouth. (Patient not taking: Reported on 05/30/2023), Disp: , Rfl:    Vitamin D, Ergocalciferol, (DRISDOL) 1.25 MG (50000 UNIT) CAPS capsule, Take 1 capsule (50,000 Units total) by mouth 2 (two) times a week., Disp: 24 capsule, Rfl:  1 Medication Side Effects: none  Family Medical/ Social History: Changes? None  MENTAL HEALTH EXAM:   There were no vitals taken for this visit.There is no height or weight on file to calculate BMI.  General Appearance: Casual, Well Groomed, and Obese  Eye Contact:  Good  Speech:  Clear and Coherent and Normal Rate  Volume:  Normal  Mood:  Euthymic  Affect:  Congruent  Thought Process:  Goal Directed and Descriptions of Associations: Intact  Orientation:  Full (Time, Place, and Person)  Thought Content: Logical   Suicidal Thoughts:  No  Homicidal Thoughts:  No  Memory:  WNL  Judgement:  Good  Insight:  Good  Psychomotor Activity:  Normal  Concentration:  Concentration: Good and Attention Span: Good  Recall:  Good  Fund of Knowledge: Good  Language: Good  Assets:  Communication Skills Desire for Improvement Financial Resources/Insurance Housing Transportation Vocational/Educational  ADL's:  Intact  Cognition: WNL  Prognosis:  Good   Labs 05/26/2023 Vitamin D 32.6  DIAGNOSES:    ICD-10-CM   1. Bipolar I disorder (HCC)  F31.9     2. Low vitamin D level  R79.89 VITAMIN D 25 Hydroxy (Vit-D Deficiency, Fractures)    3. Encounter for long-term (current) use of medications  Z79.899 VITAMIN D 25 Hydroxy (Vit-D Deficiency, Fractures)    4. Attention deficit hyperactivity disorder (ADHD), predominantly inattentive type  F90.0     5. Generalized anxiety disorder  F41.1      Receiving Psychotherapy: No   Stevphen Meuse, [facility] C.  RECOMMENDATIONS:  PDMP reviewed.  Last Concerta filled 05/19/2023.  Xanax filled 02/08/2023. I provided 30 minutes of face to face time during this encounter, including time spent before and after the visit in records review, medical decision making, counseling pertinent to today's visit, and charting.   Mood therapy lamp recommended.   Recommend increasing vitamin D.   Change Xanax to 0.25 mg, 1-1.5 pills bid prn anxiety or sleep.  Take  sparingly. Continue Depakote ER 500 mg, 4 p.o. nightly. Continue Concerta 36 mg, 2 q am.  Continue Lamictal 100 mg bid. Continue Zoloft 100 mg 2.5 pills daily. Increase vitamin D 50,000 IUs to twice weekly. Labs ordered as above. Get the Vit. D level drawn in 6 weeks.  Return in 3-4 months.   Melony Overly, PA-C

## 2023-06-19 ENCOUNTER — Other Ambulatory Visit: Payer: Self-pay | Admitting: Physician Assistant

## 2023-06-29 ENCOUNTER — Other Ambulatory Visit: Payer: Self-pay | Admitting: Physician Assistant

## 2023-07-19 ENCOUNTER — Encounter (INDEPENDENT_AMBULATORY_CARE_PROVIDER_SITE_OTHER): Payer: Self-pay | Admitting: *Deleted

## 2023-07-23 ENCOUNTER — Other Ambulatory Visit: Payer: Self-pay | Admitting: Physician Assistant

## 2023-08-11 ENCOUNTER — Telehealth: Payer: Self-pay

## 2023-08-11 NOTE — Telephone Encounter (Signed)
Please have the pt's virtual visit rescheduled.

## 2023-08-11 NOTE — Telephone Encounter (Signed)
Pt is scheduled to see Waynetta Sandy, NP tomorrow for a f/u. I was unable to find a DL for pt, so I called and spoke with [employer]. Unfortunately, their RT will not be back until Monday, so they are unable to provide or even check for a DL.   I called and spoke with pt, and she is currently still using her CPAP. Pt states she needs to reschedule her appointment due to not having any coverage at work. Pt states her [facility] is not working and she has been unable to receive any data for her sleep. Her mask is horrible and she had just ordered a new mask from [employer]. Pt states she has an "aura ring" to track her sleep. Pt states her machine does not have a SD card, but she will contact [facility] about the SD card and why her machine is not receiving data. Pt's appointment has been canceled and I will hold this slot open for Beth, Np until further notice

## 2023-08-12 ENCOUNTER — Telehealth: Payer: Self-pay | Admitting: Primary Care

## 2023-08-15 ENCOUNTER — Other Ambulatory Visit: Payer: Self-pay | Admitting: Physician Assistant

## 2023-08-24 ENCOUNTER — Other Ambulatory Visit: Payer: Self-pay | Admitting: Physician Assistant

## 2023-08-24 NOTE — Telephone Encounter (Signed)
 CAHNGED SIG DT LV NOTE 11/11 Increase vitamin D  50,000 IUs to twice weekly.

## 2023-08-26 ENCOUNTER — Other Ambulatory Visit: Payer: Self-pay | Admitting: Physician Assistant

## 2023-09-01 ENCOUNTER — Encounter: Payer: Self-pay | Admitting: Physician Assistant

## 2023-09-01 ENCOUNTER — Telehealth (INDEPENDENT_AMBULATORY_CARE_PROVIDER_SITE_OTHER): Payer: BC Managed Care – PPO | Admitting: Physician Assistant

## 2023-09-01 DIAGNOSIS — R7989 Other specified abnormal findings of blood chemistry: Secondary | ICD-10-CM

## 2023-09-01 DIAGNOSIS — F5105 Insomnia due to other mental disorder: Secondary | ICD-10-CM

## 2023-09-01 DIAGNOSIS — F411 Generalized anxiety disorder: Secondary | ICD-10-CM | POA: Diagnosis not present

## 2023-09-01 DIAGNOSIS — F319 Bipolar disorder, unspecified: Secondary | ICD-10-CM

## 2023-09-01 DIAGNOSIS — F900 Attention-deficit hyperactivity disorder, predominantly inattentive type: Secondary | ICD-10-CM

## 2023-09-01 DIAGNOSIS — F99 Mental disorder, not otherwise specified: Secondary | ICD-10-CM

## 2023-09-01 NOTE — Progress Notes (Signed)
 [facility]  Patient ID: [name],  MRN: [id number]  PCP: [name]  Date of Evaluation: 09/01/2023 time spent:25 minutes   Chief Complaint:  Chief Complaint   Depression; Anxiety; Follow-up   Virtual Visit via Telehealth  I connected with patient by a video enabled telemedicine application with their informed consent, and verified patient privacy and that I am speaking with the correct person using two identifiers.  I am private, in my office and the patient is at work.  I discussed the limitations, risks, security and privacy concerns of performing an evaluation and management service by video and the availability of in person appointments. I also discussed with the patient that there may be a patient responsible charge related to this service. The patient expressed understanding and agreed to proceed.   I discussed the assessment and treatment plan with the patient. The patient was provided an opportunity to ask questions and all were answered. The patient agreed with the plan and demonstrated an understanding of the instructions.   The patient was advised to call back or seek an in-person evaluation if the symptoms worsen or if the condition fails to improve as anticipated. 20 minutes of non-face-to-face time during this encounter.  HISTORY/CURRENT STATUS: For routine med check.  [name] is doing well.  This time of year can be a little hard, but the days are getting longer so that's good.  Patient is able to enjoy things.  Energy and motivation are good.  Work is going well.   No extreme sadness, tearfulness, or feelings of hopelessness.  Sleeps well most of the time. ADLs and personal hygiene are normal.   Denies any changes in concentration, making decisions, or remembering things.  Appetite has not changed.  Weight is stable. Anxiety is well controlled.  Needs Xanax occas when she gets overwhelmed.  Denies suicidal or homicidal  thoughts.  Patient denies increased energy with decreased need for sleep, increased talkativeness, racing thoughts, impulsivity or risky behaviors, increased spending, increased libido, grandiosity, increased irritability or anger, paranoia, or hallucinations.  Denies dizziness, syncope, seizures, numbness, tingling, tremor, tics, unsteady gait, slurred speech, confusion. Denies muscle or joint pain, stiffness, or dystonia.  Individual Medical History/ Review of Systems: Changes? :No      Past medications for mental health diagnoses include: Zoloft, trazodone, Xanax, Prozac, Vraylar, Cymbalta, Rexulti, Wellbutrin caused anxiety, lithium caused severe nausea, Abilify, Seroquel, Adderall, Depakote, Lamictal, Vyvanse, Intuniv was not effective  Allergies: Drisdol [ergocalciferol], Oxycodone, Sulfa antibiotics, and Tamiflu [oseltamivir phosphate]  Current Medications:  Current Outpatient Medications:    ALPRAZolam (XANAX) 0.25 MG tablet, TAKE 1 TO 1 AND 1/2 TABLETS(0.25 TO 0.375 MG) BY MOUTH TWICE DAILY AS NEEDED FOR ANXIETY, Disp: 90 tablet, Rfl: 0   B Complex-Biotin-FA (SUPER B-100 PO), Take by mouth., Disp: , Rfl:    divalproex (DEPAKOTE ER) 500 MG 24 hr tablet, TAKE 4 TABLETS(2000 MG) BY MOUTH DAILY, Disp: 360 tablet, Rfl: 0   esomeprazole (NEXIUM) 40 MG capsule, Take 40-80 mg by mouth daily as needed (for acid reflux). , Disp: , Rfl:    estradiol (ESTRACE) 0.1 MG/GM vaginal cream, Place 1 Applicatorful vaginally., Disp: , Rfl:    hydrochlorothiazide (HYDRODIURIL) 25 MG tablet, Take 25 mg by mouth daily., Disp: , Rfl:    lamoTRIgine (LAMICTAL) 100 MG tablet, TAKE 1 TABLET(100 MG) BY MOUTH TWICE DAILY, Disp: 60 tablet, Rfl: 1   levothyroxine (SYNTHROID) 25 MCG tablet, TAKE 1 TABLET(25 MCG) BY MOUTH DAILY BEFORE BREAKFAST, Disp:  90 tablet, Rfl: 0   lisinopril (PRINIVIL,ZESTRIL) 20 MG tablet, Take 20 mg by mouth every evening. , Disp: , Rfl:    methylphenidate 36 MG PO CR tablet, Take 2 tablets  (72 mg total) by mouth in the morning., Disp: 60 tablet, Rfl: 0   metoprolol succinate (TOPROL-XL) 50 MG 24 hr tablet, Take 50 mg by mouth daily., Disp: , Rfl:    sertraline (ZOLOFT) 100 MG tablet, TAKE 2 AND 1/2 TABLETS(250 MG) BY MOUTH DAILY, Disp: 75 tablet, Rfl: 0   Vitamin D, Ergocalciferol, (DRISDOL) 1.25 MG (50000 UNIT) CAPS capsule, TAKE 1 CAPSULE BY MOUTH 2 TIMES A WEEK., Disp: 24 capsule, Rfl: 0   Multiple Vitamin (MULTIVITAMIN) tablet, Take 1 tablet by mouth daily., Disp: , Rfl:    Omega-3 Fatty Acids (OMEGA-3 FISH OIL PO), Take by mouth., Disp: , Rfl:    VITAMIN D PO, Take by mouth., Disp: , Rfl:  Medication Side Effects: none  Family Medical/ Social History: Changes? None  MENTAL HEALTH EXAM:   There were no vitals taken for this visit.There is no height or weight on file to calculate BMI.  General Appearance: Casual, Well Groomed, and Obese  Eye Contact:  Good  Speech:  Clear and Coherent and Normal Rate  Volume:  Normal  Mood:  Euthymic  Affect:  Congruent  Thought Process:  Goal Directed and Descriptions of Associations: Intact  Orientation:  Full (Time, Place, and Person)  Thought Content: Logical   Suicidal Thoughts:  No  Homicidal Thoughts:  No  Memory:  WNL  Judgement:  Good  Insight:  Good  Psychomotor Activity:  Normal  Concentration:  Concentration: Good and Attention Span: Good  Recall:  Good  Fund of Knowledge: Good  Language: Good  Assets:  Communication Skills Desire for Improvement Financial Resources/Insurance Housing Resilience Transportation Vocational/Educational  ADL's:  Intact  Cognition: WNL  Prognosis:  Good   Labs 05/26/2023 Vitamin D 32.6  DIAGNOSES:    ICD-10-CM   1. Bipolar I disorder (HCC)  F31.9     2. Attention deficit hyperactivity disorder (ADHD), predominantly inattentive type  F90.0     3. Low vitamin D level  R79.89     4. Generalized anxiety disorder  F41.1     5. Insomnia due to other mental disorder  F51.05     F99       Receiving Psychotherapy: No   [name], [facility] C.  RECOMMENDATIONS:  PDMP reviewed.  Last Concerta filled 08/26/2023.  Gabapentin filled 08/23/2023.  Xanax filled 02/08/2023. I provided  25 minutes of non-face to face time during this encounter, including time spent before and after the visit in records review, medical decision making, counseling pertinent to today's visit, and charting.   She's doing well with current meds so no treatment changes are needed.   Continue Xanax 0.25 mg, 1-1.5 pills bid prn anxiety or sleep.    Continue Depakote ER 500 mg, 4 p.o. nightly. Continue Concerta 36 mg, 2 q am.  Continue Lamictal 100 mg bid. Continue Zoloft 100 mg 2.5 pills daily. Increase vitamin D 50,000 IUs to twice weekly. PCP checks labs, or I will order in 2 months or so if needed. She'll let me know. Return in 4-5 months.   Melony Overly, PA-C

## 2023-09-06 ENCOUNTER — Other Ambulatory Visit: Payer: Self-pay | Admitting: Psychiatry

## 2023-09-09 ENCOUNTER — Telehealth: Payer: Self-pay

## 2023-09-09 ENCOUNTER — Telehealth: Payer: 59 | Admitting: Primary Care

## 2023-09-09 DIAGNOSIS — G4733 Obstructive sleep apnea (adult) (pediatric): Secondary | ICD-10-CM

## 2023-09-09 DIAGNOSIS — G4710 Hypersomnia, unspecified: Secondary | ICD-10-CM

## 2023-09-09 NOTE — Progress Notes (Signed)
 Virtual Visit via Video Note  I connected with [name] on 09/09/23 at  3:00 PM EST by a video enabled telemedicine application and verified that I am speaking with the correct person using two identifiers.  Location: Patient: Home Provider: Office    I discussed the limitations of evaluation and management by telemedicine and the availability of in person appointments. The patient expressed understanding and agreed to proceed.  History of Present Illness: 53 year old female, never smoked. PMH significant OSA, bipolar disorder, ADHD, hypersomnia, depression, obesity.   09/09/2023- Interim hx  Discussed the use of AI scribe software for clinical note transcription with the patient, who gave verbal consent to proceed.  History of Present Illness   [name] is a 53 year old female with obstructive sleep apnea who presents with issues related to CPAP usage and fatigue.  She has been experiencing issues with her CPAP machine, which she has used for approximately five to six years. The data recording function is malfunctioning, and the SD card was taken by [facility] but has not been returned. She uses the CPAP machine most nights but has not noticed any significant benefit from it. Her last sleep study in July 2024 showed mild sleep apnea with 8 apneic events per hour. No symptoms such as gasping, choking, or waking up short of breath when not using the CPAP.  She experiences hypersomnia and fatigue, which may be related to low vitamin D levels and hypothyroidism identified in routine blood work in August. She is currently taking a vitamin D supplement and Synthroid 25 mcg for hypothyroidism. She has noticed some improvement in her fatigue but it remains persistent.  Her weight can fluctuates 20 lbs, currently around 240 pounds, which may impact her sleep apnea. She has not experienced significant improvement in fatigue with CPAP use.  She has a history of ADHD, which may  contribute to her fatigue. She reports not sleeping enough in recent years, attributing this to life stressors and possibly aging. She occasionally uses Xanax 0.25 mg in the evening to aid sleep but prefers not to use it frequently.   Observations/Objective:   Assessment and Plan:  1. OSA (obstructive sleep apnea) (Primary)  2. Hypersomnia     Mild Obstructive Sleep Apnea Patient reports no significant improvement in fatigue with CPAP therapy. Discussed the potential impact of weight loss on sleep apnea severity and the possibility of discontinuing CPAP therapy.  -Encourage side sleeping position or elevate head while sleeping with wedge pillow. We -Discontinue CPAP therapy and monitor for any worsening of symptoms such as gasping, shortness of breath, or choking during sleep. -Consider an oral appliance if snoring becomes disruptive. -Encourage weight loss of approximately 20 pounds. -Discuss potential use of weight loss medication with primary care provider.  Hypersomnia Stable/Improved; Multifactorial due to vit D deficiency, hypothyroidism and ADHD - Continue Vitamin D, synthroid and concerta   Hypothyroidism Patient is currently on Synthroid 25 mcg for management. -Continue Synthroid 25 mcg daily.  Vitamin D Deficiency Patient is currently on a Vitamin D supplement. -Continue Vitamin D supplement as prescribed.  Anxiety Patient has Xanax available as needed. -Continue Xanax as needed for anxiety.  Gastroesophageal Reflux Disease Patient is currently on Nexium as needed. -Continue Nexium as needed for reflux symptoms.  Hypertension Patient is currently on Lisinopril 20 mg daily and Hydrochlorothiazide 25 mg daily. -Continue Lisinopril 20 mg daily and Hydrochlorothiazide 25 mg daily.  Attention Deficit Hyperactivity Disorder Patient is currently on Concerta  72 mg daily. -Continue Concerta 72 mg daily.  Depression Patient is currently on Sertraline 250 mg  daily. -Continue Sertraline 250 mg daily.  Bipolar Disorder Patient is currently on Depakote 500 mg daily and Lamictal 100 mg twice daily. -Continue Depakote 500 mg daily and Lamictal 100 mg twice daily.  Menopausal Symptoms Patient recently started Estradiol vaginal cream. -Continue Estradiol vaginal cream as prescribed.     Follow Up Instructions:  -Consider follow-up in 6-8 months or as needed.    I discussed the assessment and treatment plan with the patient. The patient was provided an opportunity to ask questions and all were answered. The patient agreed with the plan and demonstrated an understanding of the instructions.   The patient was advised to call back or seek an in-person evaluation if the symptoms worsen or if the condition fails to improve as anticipated.  I provided 22 minutes of non-face-to-face time during this encounter.   Glenford Bayley, NP

## 2023-09-09 NOTE — Telephone Encounter (Signed)
 I called [facility] regarding our office not tagged in [facility] for pt's CPAP compliance DL. I was transferred to a RT who was unable to speak to me, so I left a vm asking for a call back.

## 2023-09-09 NOTE — Telephone Encounter (Signed)
 [facility] called me back. Burna Mortimer, RT states Pt is in [place] under a different provider and is not under her [facility]. The unit did come from [place], but the other provider took it away from them and she is unsure of where the pts access belongs/goes too or who the other provider is.

## 2023-09-11 ENCOUNTER — Encounter: Payer: Self-pay | Admitting: Physician Assistant

## 2023-09-19 ENCOUNTER — Other Ambulatory Visit: Payer: Self-pay | Admitting: Physician Assistant

## 2023-09-24 ENCOUNTER — Other Ambulatory Visit: Payer: Self-pay | Admitting: Physician Assistant

## 2023-09-27 ENCOUNTER — Other Ambulatory Visit: Payer: Self-pay | Admitting: Physician Assistant

## 2023-10-12 ENCOUNTER — Other Ambulatory Visit: Payer: Self-pay

## 2023-10-12 DIAGNOSIS — G4733 Obstructive sleep apnea (adult) (pediatric): Secondary | ICD-10-CM

## 2023-10-12 NOTE — Telephone Encounter (Signed)
 Yes Dr. Irene Limbo, we can refer

## 2023-10-12 NOTE — Progress Notes (Signed)
 Referral for Dr Irene Limbo sent.

## 2023-10-24 ENCOUNTER — Other Ambulatory Visit: Payer: Self-pay | Admitting: Physician Assistant

## 2023-10-25 NOTE — Telephone Encounter (Signed)
 [name] called and made follow up visit for 6/25

## 2023-11-23 ENCOUNTER — Other Ambulatory Visit: Payer: Self-pay | Admitting: Physician Assistant

## 2023-11-26 ENCOUNTER — Other Ambulatory Visit: Payer: Self-pay | Admitting: Physician Assistant

## 2023-11-30 ENCOUNTER — Other Ambulatory Visit: Payer: Self-pay | Admitting: Physician Assistant

## 2023-12-05 ENCOUNTER — Other Ambulatory Visit: Payer: Self-pay | Admitting: Physician Assistant

## 2023-12-30 ENCOUNTER — Other Ambulatory Visit: Payer: Self-pay | Admitting: Physician Assistant

## 2024-01-02 ENCOUNTER — Other Ambulatory Visit: Payer: Self-pay | Admitting: Physician Assistant

## 2024-01-11 ENCOUNTER — Telehealth (INDEPENDENT_AMBULATORY_CARE_PROVIDER_SITE_OTHER): Admitting: Physician Assistant

## 2024-01-11 ENCOUNTER — Encounter: Payer: Self-pay | Admitting: Physician Assistant

## 2024-01-11 DIAGNOSIS — F411 Generalized anxiety disorder: Secondary | ICD-10-CM

## 2024-01-11 DIAGNOSIS — F5105 Insomnia due to other mental disorder: Secondary | ICD-10-CM

## 2024-01-11 DIAGNOSIS — F319 Bipolar disorder, unspecified: Secondary | ICD-10-CM

## 2024-01-11 DIAGNOSIS — R4589 Other symptoms and signs involving emotional state: Secondary | ICD-10-CM | POA: Diagnosis not present

## 2024-01-11 DIAGNOSIS — F900 Attention-deficit hyperactivity disorder, predominantly inattentive type: Secondary | ICD-10-CM

## 2024-01-11 DIAGNOSIS — F99 Mental disorder, not otherwise specified: Secondary | ICD-10-CM

## 2024-01-11 NOTE — Progress Notes (Signed)
 [facility]  Patient ID: [name],  MRN: [id number]  PCP: [name]  Date of Evaluation: 01/11/2024 time spent:20 minutes   Chief Complaint:  Chief Complaint   Depression; Anxiety; Follow-up   Virtual Visit via Telehealth  I connected with patient by a video enabled telemedicine application with their informed consent, and verified patient privacy and that I am speaking with the correct person using two identifiers.  I am private, in my office and the patient is at home.  I discussed the limitations, risks, security and privacy concerns of performing an evaluation and management service by video and the availability of in person appointments. I also discussed with the patient that there may be a patient responsible charge related to this service. The patient expressed understanding and agreed to proceed.   I discussed the assessment and treatment plan with the patient. The patient was provided an opportunity to ask questions and all were answered. The patient agreed with the plan and demonstrated an understanding of the instructions.   The patient was advised to call back or seek an in-person evaluation if the symptoms worsen or if the condition fails to improve as anticipated. I provided approx 20 minutes of non-face-to-face time during this encounter.  HISTORY/CURRENT STATUS: For routine med check.  Has been down for past few weeks. Her son got married last weekend. She likes his wife and is happy for him, it's just hard having her only child leaving home for good. Another thing that's getting her down is she's been pretty sick for about a month with URI. Gradually improving though.   Until these stressors came along, her mood was stable. Patient has been able to enjoy things.  Energy and motivation are good.  Work is going well.  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  Sleeps well  most of the time. ADLs and personal hygiene are normal.   States that attention is good without easy distractibility.  Able to focus on things and finish tasks to completion.  No memory changes.  Appetite has not changed.  Weight is stable.  Gets overwhelmed sometimes, no PA.  Xanax  is helpful when needed.  Denies suicidal or homicidal thoughts.  Patient denies increased energy with decreased need for sleep, increased talkativeness, racing thoughts, impulsivity or risky behaviors, increased spending, increased libido, grandiosity, increased irritability or anger, paranoia, or hallucinations.  Denies dizziness, syncope, seizures, numbness, tingling, tremor, tics, unsteady gait, slurred speech, confusion. Denies muscle or joint pain, stiffness, or dystonia.  Individual Medical History/ Review of Systems: Changes? :Yes  has been sick with URI  Past medications for mental health diagnoses include: Zoloft , trazodone , Xanax , Prozac , Vraylar , Cymbalta , Rexulti , Wellbutrin  caused anxiety, lithium  caused severe nausea, Abilify , Seroquel , Adderall, Depakote , Lamictal , Vyvanse, Intuniv  was not effective  Allergies: Drisdol  [ergocalciferol ], Oxycodone, Sulfa antibiotics, and Tamiflu [oseltamivir phosphate]  Current Medications:  Current Outpatient Medications:    ALPRAZolam  (XANAX ) 0.25 MG tablet, TAKE 1 TO 1 AND 1/2 TABLETS(0.25 TO 0.375 MG) BY MOUTH TWICE DAILY AS NEEDED FOR ANXIETY, Disp: 90 tablet, Rfl: 0   B Complex-Biotin-FA (SUPER B-100 PO), Take by mouth., Disp: , Rfl:    divalproex  (DEPAKOTE  ER) 500 MG 24 hr tablet, TAKE 4 TABLETS(2000 MG) BY MOUTH DAILY, Disp: 360 tablet, Rfl: 0   esomeprazole (NEXIUM) 40 MG capsule, Take 40-80 mg by mouth daily as needed (for acid reflux). , Disp: , Rfl:  estradiol (ESTRACE) 0.1 MG/GM vaginal cream, Place 1 Applicatorful vaginally., Disp: , Rfl:    hydrochlorothiazide (HYDRODIURIL) 25 MG tablet, Take 25 mg by mouth daily., Disp: , Rfl:    lamoTRIgine  (LAMICTAL )  100 MG tablet, TAKE 1 TABLET(100 MG) BY MOUTH TWICE DAILY, Disp: 60 tablet, Rfl: 1   levothyroxine  (SYNTHROID ) 25 MCG tablet, TAKE 1 TABLET(25 MCG) BY MOUTH DAILY BEFORE BREAKFAST, Disp: 90 tablet, Rfl: 0   lisinopril  (PRINIVIL ,ZESTRIL ) 20 MG tablet, Take 20 mg by mouth every evening. , Disp: , Rfl:    methylphenidate  36 MG PO CR tablet, Take 2 tablets (72 mg total) by mouth in the morning., Disp: 60 tablet, Rfl: 0   metoprolol succinate (TOPROL-XL) 50 MG 24 hr tablet, Take 50 mg by mouth daily., Disp: , Rfl:    sertraline  (ZOLOFT ) 100 MG tablet, TAKE 2 AND 1/2 TABLETS(250 MG) BY MOUTH DAILY, Disp: 75 tablet, Rfl: 0   Vitamin D , Ergocalciferol , (DRISDOL ) 1.25 MG (50000 UNIT) CAPS capsule, TAKE 1 CAPSULE BY MOUTH 2 TIMES A WEEK., Disp: 24 capsule, Rfl: 0   Multiple Vitamin (MULTIVITAMIN) tablet, Take 1 tablet by mouth daily., Disp: , Rfl:    Omega-3 Fatty Acids (OMEGA-3 FISH OIL PO), Take by mouth., Disp: , Rfl:    VITAMIN D  PO, Take by mouth., Disp: , Rfl:  Medication Side Effects: none  Family Medical/ Social History: Changes? None  MENTAL HEALTH EXAM:   There were no vitals taken for this visit.There is no height or weight on file to calculate BMI.  General Appearance: Casual, Well Groomed, and Obese  Eye Contact:  Good  Speech:  Clear and Coherent and Normal Rate  Volume:  Normal  Mood:  Euthymic  Affect:  Congruent  Thought Process:  Goal Directed and Descriptions of Associations: Intact  Orientation:  Full (Time, Place, and Person)  Thought Content: Logical   Suicidal Thoughts:  No  Homicidal Thoughts:  No  Memory:  WNL  Judgement:  Good  Insight:  Good  Psychomotor Activity:  Normal  Concentration:  Concentration: Good and Attention Span: Good  Recall:  Good  Fund of Knowledge: Good  Language: Good  Assets:  Communication Skills Desire for Improvement Financial Resources/Insurance Housing Leisure Time Resilience Transportation Vocational/Educational  ADL's:  Intact   Cognition: WNL  Prognosis:  Good   PCP follows labs  DIAGNOSES:    ICD-10-CM   1. Bipolar I disorder (HCC)  F31.9     2. Attention deficit hyperactivity disorder (ADHD), predominantly inattentive type  F90.0     3. Low vitamin D  level  R79.89     4. Generalized anxiety disorder  F41.1     5. Insomnia due to other mental disorder  F51.05    F99      Receiving Psychotherapy: No     RECOMMENDATIONS:  PDMP reviewed.  Last Concerta  filled 01/03/2024.  Xanax  filled 11/28/2023. I provided approximately 20  minutes of non-face-to-face time during this encounter, including time spent before and after the visit in records review, medical decision making, counseling pertinent to today's visit, and charting.   She's going through circumstantial sadness, even though she's happy her son got married, it's stressful and is the start of a new phase of her life.  I don't think medication changes will be beneficial at this time. But if her mood doesn't go back to her baseline soon, call.  We've disc FMLA in the past b/c there are days she drained and unable to function normally.  Consider intermittent  FMLA 2 days/mo, 8 hr/day.   Continue Xanax  0.25 mg, 1-1.5 pills bid prn anxiety or sleep.    Continue Depakote  ER 500 mg, 4 p.o. nightly. Continue Concerta  36 mg, 2 q am.  Continue Lamictal  100 mg bid. Continue Zoloft  100 mg 2.5 pills daily. Recommend she get back in counseling.  Return in 2 months.  Verneita Cooks, PA-C

## 2024-01-16 ENCOUNTER — Encounter (INDEPENDENT_AMBULATORY_CARE_PROVIDER_SITE_OTHER): Payer: Self-pay | Admitting: *Deleted

## 2024-01-30 ENCOUNTER — Other Ambulatory Visit: Payer: Self-pay | Admitting: Physician Assistant

## 2024-02-09 ENCOUNTER — Other Ambulatory Visit: Payer: Self-pay | Admitting: Physician Assistant

## 2024-03-07 ENCOUNTER — Telehealth: Admitting: Physician Assistant

## 2024-03-08 ENCOUNTER — Telehealth: Admitting: Physician Assistant

## 2024-03-15 ENCOUNTER — Other Ambulatory Visit: Payer: Self-pay | Admitting: Adult Health

## 2024-03-15 ENCOUNTER — Other Ambulatory Visit: Payer: Self-pay | Admitting: Physician Assistant

## 2024-03-20 ENCOUNTER — Other Ambulatory Visit: Payer: Self-pay | Admitting: Physician Assistant

## 2024-03-20 DIAGNOSIS — F900 Attention-deficit hyperactivity disorder, predominantly inattentive type: Secondary | ICD-10-CM

## 2024-04-04 ENCOUNTER — Encounter: Payer: Self-pay | Admitting: Physician Assistant

## 2024-04-04 ENCOUNTER — Telehealth (INDEPENDENT_AMBULATORY_CARE_PROVIDER_SITE_OTHER): Admitting: Physician Assistant

## 2024-04-04 DIAGNOSIS — F5105 Insomnia due to other mental disorder: Secondary | ICD-10-CM | POA: Diagnosis not present

## 2024-04-04 DIAGNOSIS — F319 Bipolar disorder, unspecified: Secondary | ICD-10-CM | POA: Diagnosis not present

## 2024-04-04 DIAGNOSIS — F99 Mental disorder, not otherwise specified: Secondary | ICD-10-CM

## 2024-04-04 DIAGNOSIS — F900 Attention-deficit hyperactivity disorder, predominantly inattentive type: Secondary | ICD-10-CM | POA: Diagnosis not present

## 2024-04-04 DIAGNOSIS — Z79899 Other long term (current) drug therapy: Secondary | ICD-10-CM

## 2024-04-04 DIAGNOSIS — F411 Generalized anxiety disorder: Secondary | ICD-10-CM | POA: Diagnosis not present

## 2024-04-04 MED ORDER — METHYLPHENIDATE HCL ER (OSM) 36 MG PO TBCR: 72.0000 mg | EXTENDED_RELEASE_TABLET | Freq: Every day | ORAL | 0 refills | Status: DC

## 2024-04-04 MED ORDER — SERTRALINE HCL 100 MG PO TABS: ORAL_TABLET | ORAL | 1 refills | Status: AC

## 2024-04-04 MED ORDER — LAMOTRIGINE 100 MG PO TABS: 100.0000 mg | ORAL_TABLET | Freq: Two times a day (BID) | ORAL | 1 refills | Status: AC

## 2024-04-04 NOTE — Progress Notes (Signed)
 [facility]  Patient ID: [name],  MRN: [id number]  PCP: [name]  Date of Evaluation: 04/04/2024 time spent:30 minutes   Chief Complaint:  Chief Complaint   Depression; Anxiety; Follow-up   Virtual Visit via Telehealth  I connected with patient by a video enabled telemedicine application with their informed consent, and verified patient privacy and that I am speaking with the correct person using two identifiers.  I am private, in my office and the patient is at home.  I discussed the limitations, risks, security and privacy concerns of performing an evaluation and management service by video and the availability of in person appointments. I also discussed with the patient that there may be a patient responsible charge related to this service. The patient expressed understanding and agreed to proceed.   I discussed the assessment and treatment plan with the patient. The patient was provided an opportunity to ask questions and all were answered. The patient agreed with the plan and demonstrated an understanding of the instructions.   The patient was advised to call back or seek an in-person evaluation if the symptoms worsen or if the condition fails to improve as anticipated. I provided approx 30 minutes of non-face-to-face time during this encounter.  HISTORY/CURRENT STATUS: For routine med check.  Doing well w/ current meds. Patient is able to enjoy things.  Energy and motivation are good.  Work is going well.  See Social hx.  No extreme sadness, tearfulness, or feelings of hopelessness.  Sleeps ok but needs Xanax  to help at times. ADLs and personal hygiene are normal.  Appetite has not changed.  Weight is stable.  Anxiety is controlled.  No PA, just can't get her mind to shut off.  No SI/HI.  Concerta  is still effective.  States that attention is good without easy distractibility.  Able to focus on things and finish tasks to completion.    Patient denies increased energy with decreased need for sleep, increased talkativeness, racing thoughts, impulsivity or risky behaviors, increased spending, increased libido, grandiosity, increased irritability or anger, paranoia, or hallucinations.  Individual Medical History/ Review of Systems: Changes? :No    Past medications for mental health diagnoses include: Zoloft , trazodone , Xanax , Prozac , Vraylar , Cymbalta , Rexulti , Wellbutrin  caused anxiety, lithium  caused severe nausea, Abilify , Seroquel , Adderall, Depakote , Lamictal , Vyvanse, Intuniv  was not effective  Allergies: Drisdol  [ergocalciferol ], Oxycodone, Sulfa antibiotics, and Tamiflu [oseltamivir phosphate]  Current Medications:  Current Outpatient Medications:    ALPRAZolam  (XANAX ) 0.25 MG tablet, TAKE 1 TO 1 AND 1/2 TABLETS(0.25 TO 0.375 MG) BY MOUTH TWICE DAILY AS NEEDED FOR ANXIETY, Disp: 90 tablet, Rfl: 0   B Complex-Biotin-FA (SUPER B-100 PO), Take by mouth., Disp: , Rfl:    divalproex  (DEPAKOTE  ER) 500 MG 24 hr tablet, TAKE 4 TABLETS(2000 MG) BY MOUTH DAILY, Disp: 360 tablet, Rfl: 0   esomeprazole (NEXIUM) 40 MG capsule, Take 40-80 mg by mouth daily as needed (for acid reflux). , Disp: , Rfl:    estradiol (ESTRACE) 0.1 MG/GM vaginal cream, Place 1 Applicatorful vaginally., Disp: , Rfl:    hydrochlorothiazide (HYDRODIURIL) 25 MG tablet, Take 25 mg by mouth daily., Disp: , Rfl:    lamoTRIgine  (LAMICTAL ) 100 MG tablet, TAKE 1 TABLET(100 MG) BY MOUTH TWICE DAILY, Disp: 60 tablet, Rfl: 1   levothyroxine  (SYNTHROID ) 25 MCG tablet, TAKE 1 TABLET(25 MCG) BY MOUTH DAILY BEFORE BREAKFAST, Disp: 90 tablet, Rfl: 0   lisinopril  (PRINIVIL ,ZESTRIL ) 20 MG tablet, Take 20 mg by mouth every evening. , Disp: ,  Rfl:    methylphenidate  36 MG PO CR tablet, Take 2 tablets (72 mg total) by mouth in the morning., Disp: 60 tablet, Rfl: 0   metoprolol succinate (TOPROL-XL) 50 MG 24 hr tablet, Take 50 mg by mouth daily., Disp: , Rfl:    sertraline   (ZOLOFT ) 100 MG tablet, TAKE 2 AND 1/2 TABLETS(250 MG) BY MOUTH DAILY, Disp: 75 tablet, Rfl: 0   Vitamin D , Ergocalciferol , (DRISDOL ) 1.25 MG (50000 UNIT) CAPS capsule, TAKE 1 CAPSULE BY MOUTH 2 TIMES A WEEK., Disp: 24 capsule, Rfl: 0   Multiple Vitamin (MULTIVITAMIN) tablet, Take 1 tablet by mouth daily., Disp: , Rfl:    Omega-3 Fatty Acids (OMEGA-3 FISH OIL PO), Take by mouth., Disp: , Rfl:    VITAMIN D  PO, Take by mouth., Disp: , Rfl:  Medication Side Effects: none  Family Medical/ Social History: 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  General Appearance: Casual and Well Groomed  Eye Contact:  Good  Speech:  Clear and Coherent and Normal Rate  Volume:  Normal  Mood:  Euthymic  Affect:  Congruent  Thought Process:  Goal Directed and Descriptions of Associations: Intact  Orientation:  Full (Time, Place, and Person)  Thought Content: Logical   Suicidal Thoughts:  No  Homicidal Thoughts:  No  Memory:  WNL  Judgement:  Good  Insight:  Good  Psychomotor Activity:  Normal  Concentration:  Concentration: Good and Attention Span: Good  Recall:  Good  Fund of Knowledge: Good  Language: Good  Assets:  Communication Skills Desire for Improvement Financial Resources/Insurance Housing Leisure Time Resilience Transportation Vocational/Educational  ADL's:  Intact  Cognition: WNL  Prognosis:  Good   PCP follows labs  DIAGNOSES:    ICD-10-CM   1. Bipolar I disorder (HCC)  F31.9     2. Attention deficit hyperactivity disorder (ADHD), predominantly inattentive type  F90.0     3. Low vitamin D  level  R79.89     4. Generalized anxiety disorder  F41.1     5. Insomnia due to other mental disorder  F51.05    F99      Receiving Psychotherapy: No     RECOMMENDATIONS:  PDMP reviewed.  Last Concerta  filled 03/21/2024.  Xanax  filled 03/16/2024. I provided approximately 30   minutes of non-face-to-face time during this encounter, including time spent before and after the visit in records review, medical decision making, counseling pertinent to today's visit, and charting.   She's doing well, no changes are needed.  Sleep hygiene discussed.   Continue Xanax  0.25 mg, 2-4 at bedtime prn sleep.  Continue Depakote  ER 500 mg, 4 p.o. nightly. Continue Concerta  36 mg, 2 q am.  Continue Lamictal  100 mg bid. Continue Zoloft  100 mg 2.5 pills daily. Labs ordered as noted above.  Return in 6 months.  Verneita Cooks, PA-C

## 2024-04-12 ENCOUNTER — Telehealth: Admitting: Physician Assistant

## 2024-04-22 LAB — CBC WITH DIFFERENTIAL/PLATELET
Basophils Absolute: 0 x10E3/uL (ref 0.0–0.2)
Basos: 1 %
EOS (ABSOLUTE): 0.4 x10E3/uL (ref 0.0–0.4)
Eos: 6 %
Hematocrit: 44.3 % (ref 34.0–46.6)
Hemoglobin: 14.6 g/dL (ref 11.1–15.9)
Immature Grans (Abs): 0 x10E3/uL (ref 0.0–0.1)
Immature Granulocytes: 0 %
Lymphocytes Absolute: 2.4 x10E3/uL (ref 0.7–3.1)
Lymphs: 34 %
MCH: 30.5 pg (ref 26.6–33.0)
MCHC: 33 g/dL (ref 31.5–35.7)
MCV: 93 fL (ref 79–97)
Monocytes Absolute: 0.5 x10E3/uL (ref 0.1–0.9)
Monocytes: 7 %
Neutrophils Absolute: 3.6 x10E3/uL (ref 1.4–7.0)
Neutrophils: 52 %
Platelets: 358 x10E3/uL (ref 150–450)
RBC: 4.78 x10E6/uL (ref 3.77–5.28)
RDW: 12.6 % (ref 11.7–15.4)
WBC: 6.9 x10E3/uL (ref 3.4–10.8)

## 2024-04-22 LAB — COMPREHENSIVE METABOLIC PANEL WITH GFR
ALT: 14 IU/L (ref 0–32)
AST: 13 IU/L (ref 0–40)
Albumin: 4.6 g/dL (ref 3.9–4.9)
Alkaline Phosphatase: 67 IU/L (ref 41–116)
BUN/Creatinine Ratio: 25 — ABNORMAL HIGH (ref 9–23)
BUN: 14 mg/dL (ref 6–24)
Bilirubin Total: 0.2 mg/dL (ref 0.0–1.2)
CO2: 27 mmol/L (ref 20–29)
Calcium: 10.1 mg/dL (ref 8.7–10.2)
Chloride: 98 mmol/L (ref 96–106)
Creatinine, Ser: 0.57 mg/dL (ref 0.57–1.00)
Globulin, Total: 2.6 g/dL (ref 1.5–4.5)
Glucose: 97 mg/dL (ref 70–99)
Potassium: 4.7 mmol/L (ref 3.5–5.2)
Sodium: 141 mmol/L (ref 134–144)
Total Protein: 7.2 g/dL (ref 6.0–8.5)
eGFR: 111 mL/min/1.73 (ref >59)

## 2024-04-22 LAB — VALPROIC ACID LEVEL: Valproic Acid Lvl: 35 ug/mL — ABNORMAL LOW (ref 50–100)

## 2024-04-24 ENCOUNTER — Ambulatory Visit: Payer: Self-pay | Admitting: Physician Assistant

## 2024-04-24 NOTE — Progress Notes (Signed)
 All labs are ok except the depakote  level is low. Is she taking it as directed? Did she have the lab drawn approx 12 between doses? At the LOV, she was doing well, so I don't think anything needs to change, just emphasize taking it as directed.  If she starts getting manic, call and will need to increase the dose.

## 2024-04-28 ENCOUNTER — Other Ambulatory Visit: Payer: Self-pay | Admitting: Physician Assistant

## 2024-05-03 NOTE — Progress Notes (Signed)
 Noted

## 2024-06-18 ENCOUNTER — Other Ambulatory Visit: Payer: Self-pay | Admitting: Physician Assistant

## 2024-07-03 ENCOUNTER — Other Ambulatory Visit: Payer: Self-pay | Admitting: Physician Assistant

## 2024-07-24 ENCOUNTER — Other Ambulatory Visit: Payer: Self-pay | Admitting: Physician Assistant

## 2024-07-24 DIAGNOSIS — F900 Attention-deficit hyperactivity disorder, predominantly inattentive type: Secondary | ICD-10-CM

## 2024-07-25 ENCOUNTER — Other Ambulatory Visit: Payer: Self-pay | Admitting: Physician Assistant

## 2024-07-25 NOTE — Telephone Encounter (Signed)
 Pt is needing refill of concerta  and wanting a few months refills.

## 2024-07-26 MED ORDER — METHYLPHENIDATE HCL ER (OSM) 36 MG PO TBCR: 72.0000 mg | EXTENDED_RELEASE_TABLET | Freq: Every day | ORAL | 0 refills | Status: AC

## 2024-10-04 ENCOUNTER — Telehealth: Admitting: Physician Assistant
